# Patient Record
Sex: Female | Born: 1965 | Race: White | Hispanic: No | Marital: Married | Smoking: Never smoker
Health system: Southern US, Community
[De-identification: ages and names within clinical notes are randomized; demographics above are authoritative.]

## PROBLEM LIST (undated history)

## (undated) DIAGNOSIS — M2550 Pain in unspecified joint: Secondary | ICD-10-CM

## (undated) DIAGNOSIS — M25559 Pain in unspecified hip: Secondary | ICD-10-CM

## (undated) DIAGNOSIS — I5032 Chronic diastolic (congestive) heart failure: Secondary | ICD-10-CM

## (undated) DIAGNOSIS — R0600 Dyspnea, unspecified: Secondary | ICD-10-CM

## (undated) DIAGNOSIS — T7840XA Allergy, unspecified, initial encounter: Secondary | ICD-10-CM

## (undated) DIAGNOSIS — R0602 Shortness of breath: Secondary | ICD-10-CM

## (undated) DIAGNOSIS — E785 Hyperlipidemia, unspecified: Secondary | ICD-10-CM

## (undated) DIAGNOSIS — G4730 Sleep apnea, unspecified: Secondary | ICD-10-CM

## (undated) DIAGNOSIS — E538 Deficiency of other specified B group vitamins: Secondary | ICD-10-CM

## (undated) DIAGNOSIS — I872 Venous insufficiency (chronic) (peripheral): Secondary | ICD-10-CM

## (undated) DIAGNOSIS — R600 Localized edema: Secondary | ICD-10-CM

## (undated) DIAGNOSIS — E559 Vitamin D deficiency, unspecified: Secondary | ICD-10-CM

## (undated) DIAGNOSIS — K829 Disease of gallbladder, unspecified: Secondary | ICD-10-CM

## (undated) DIAGNOSIS — E669 Obesity, unspecified: Secondary | ICD-10-CM

## (undated) DIAGNOSIS — M549 Dorsalgia, unspecified: Secondary | ICD-10-CM

## (undated) DIAGNOSIS — K219 Gastro-esophageal reflux disease without esophagitis: Secondary | ICD-10-CM

## (undated) DIAGNOSIS — I10 Essential (primary) hypertension: Secondary | ICD-10-CM

## (undated) DIAGNOSIS — I509 Heart failure, unspecified: Secondary | ICD-10-CM

## (undated) DIAGNOSIS — E119 Type 2 diabetes mellitus without complications: Secondary | ICD-10-CM

## (undated) DIAGNOSIS — M25569 Pain in unspecified knee: Secondary | ICD-10-CM

## (undated) HISTORY — DX: Venous insufficiency (chronic) (peripheral): I87.2

## (undated) HISTORY — DX: Disease of gallbladder, unspecified: K82.9

## (undated) HISTORY — DX: Hyperlipidemia, unspecified: E78.5

## (undated) HISTORY — PX: OTHER SURGICAL HISTORY: SHX169

## (undated) HISTORY — DX: Deficiency of other specified B group vitamins: E53.8

## (undated) HISTORY — DX: Essential (primary) hypertension: I10

## (undated) HISTORY — DX: Shortness of breath: R06.02

## (undated) HISTORY — DX: Localized edema: R60.0

## (undated) HISTORY — DX: Sleep apnea, unspecified: G47.30

## (undated) HISTORY — DX: Chronic diastolic (congestive) heart failure: I50.32

## (undated) HISTORY — DX: Dyspnea, unspecified: R06.00

## (undated) HISTORY — DX: Pain in unspecified joint: M25.50

## (undated) HISTORY — DX: Heart failure, unspecified: I50.9

## (undated) HISTORY — DX: Obesity, unspecified: E66.9

## (undated) HISTORY — DX: Vitamin D deficiency, unspecified: E55.9

## (undated) HISTORY — DX: Allergy, unspecified, initial encounter: T78.40XA

## (undated) HISTORY — DX: Pain in unspecified knee: M25.569

## (undated) HISTORY — DX: Pain in unspecified hip: M25.559

## (undated) HISTORY — DX: Dorsalgia, unspecified: M54.9

## (undated) HISTORY — DX: Gastro-esophageal reflux disease without esophagitis: K21.9

## (undated) HISTORY — DX: Type 2 diabetes mellitus without complications: E11.9

## (undated) HISTORY — PX: CHOLECYSTECTOMY: SHX55

---
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
## 1998-04-05 ENCOUNTER — Other Ambulatory Visit: Admission: RE | Admit: 1998-04-05 | Discharge: 1998-04-05 | Payer: Self-pay | Admitting: Gynecology

## 1998-05-10 ENCOUNTER — Other Ambulatory Visit: Admission: RE | Admit: 1998-05-10 | Discharge: 1998-05-10 | Payer: Self-pay | Admitting: Gynecology

## 1999-07-31 ENCOUNTER — Other Ambulatory Visit: Admission: RE | Admit: 1999-07-31 | Discharge: 1999-07-31 | Payer: Self-pay | Admitting: Gynecology

## 2000-12-30 ENCOUNTER — Other Ambulatory Visit: Admission: RE | Admit: 2000-12-30 | Discharge: 2000-12-30 | Payer: Self-pay | Admitting: Gynecology

## 2001-02-02 ENCOUNTER — Ambulatory Visit (HOSPITAL_COMMUNITY): Admission: RE | Admit: 2001-02-02 | Discharge: 2001-02-02 | Payer: Self-pay | Admitting: Gynecology

## 2001-02-02 ENCOUNTER — Encounter: Payer: Self-pay | Admitting: Gynecology

## 2001-05-05 ENCOUNTER — Other Ambulatory Visit: Admission: RE | Admit: 2001-05-05 | Discharge: 2001-05-05 | Payer: Self-pay | Admitting: Gynecology

## 2001-07-31 ENCOUNTER — Encounter: Admission: RE | Admit: 2001-07-31 | Discharge: 2001-10-29 | Payer: Self-pay | Admitting: Gynecology

## 2001-11-03 ENCOUNTER — Encounter (INDEPENDENT_AMBULATORY_CARE_PROVIDER_SITE_OTHER): Payer: Self-pay

## 2001-11-03 ENCOUNTER — Inpatient Hospital Stay (HOSPITAL_COMMUNITY): Admission: AD | Admit: 2001-11-03 | Discharge: 2001-11-05 | Payer: Self-pay | Admitting: Gynecology

## 2001-12-15 ENCOUNTER — Other Ambulatory Visit: Admission: RE | Admit: 2001-12-15 | Discharge: 2001-12-15 | Payer: Self-pay | Admitting: Gynecology

## 2003-02-18 ENCOUNTER — Other Ambulatory Visit: Admission: RE | Admit: 2003-02-18 | Discharge: 2003-02-18 | Payer: Self-pay | Admitting: Gynecology

## 2005-05-20 ENCOUNTER — Other Ambulatory Visit: Admission: RE | Admit: 2005-05-20 | Discharge: 2005-05-20 | Payer: Self-pay | Admitting: Gynecology

## 2005-12-31 ENCOUNTER — Ambulatory Visit (HOSPITAL_COMMUNITY): Admission: RE | Admit: 2005-12-31 | Discharge: 2005-12-31 | Payer: Self-pay | Admitting: Gynecology

## 2007-01-29 ENCOUNTER — Ambulatory Visit (HOSPITAL_COMMUNITY): Admission: RE | Admit: 2007-01-29 | Discharge: 2007-01-29 | Payer: Self-pay | Admitting: Gynecology

## 2007-05-14 ENCOUNTER — Other Ambulatory Visit: Admission: RE | Admit: 2007-05-14 | Discharge: 2007-05-14 | Payer: Self-pay | Admitting: Gynecology

## 2008-01-28 ENCOUNTER — Ambulatory Visit: Payer: Self-pay | Admitting: Gynecology

## 2008-03-03 ENCOUNTER — Ambulatory Visit: Payer: Self-pay | Admitting: Gynecology

## 2008-04-07 ENCOUNTER — Encounter: Admission: RE | Admit: 2008-04-07 | Discharge: 2008-04-07 | Payer: Self-pay | Admitting: Gynecology

## 2010-06-29 NOTE — Discharge Summary (Signed)
   NAME:  [name], [name]                          ACCOUNT NO.:  [id number]   MEDICAL RECORD NO.:  [id number]                   PATIENT TYPE:  INP   LOCATION:  9146                                 FACILITY:  [facility]   PHYSICIAN:  Juan H. Lily Peer, M.D.             DATE OF BIRTH:  [birth year]   DATE OF ADMISSION:  11/03/2001  DATE OF DISCHARGE:  11/05/2001                                 DISCHARGE SUMMARY   DISCHARGE DIAGNOSES:  1. Intrauterine pregnancy 37 weeks delivered.  2. Gestational diabetes diet controlled.  3. Positive group B Strep.  4. Chronic hypertension.  5. Advanced maternal age.  6. Rh negative.  7. Status post spontaneous vaginal delivery.   HISTORY:  A 45 year old female gravida 2, para 1 with an EDC of November 23, 2001.  Prenatal course complicated by advanced maternal age.  Subsequently  underwent amniocentesis revealing normal chromosomes.  Also is Rh negative.  Given RhoGAM in pregnancy.  Had chronic hypertension.  _________ diet  controlled.  Also is positive group B Strep.   HOSPITAL COURSE:  On November 03, 2001 the patient was admitted at 37 weeks  with spontaneous rupture of membranes.  Cervix was 3 cm, 80%, -3.  Pitocin  augmentation on November 03, 2001.  The patient underwent spontaneous  vaginal delivery of a female Apgars 8/9 weighing 9 pounds and 1 ounce.  There were no complications.  Episiotomy second degree midline small  laceration which was repaired.  The patient was given IV penicillin per  protocol for positive group B Strep.  Postpartum patient remained afebrile,  voiding, stable condition.  It was noted that on November 04, 2001 there  was a small water blister ______________.  The patient says she is not sure  how and when that had appeared but it remained stable without any evidence  of infection.  Therefore, patient felt stable for discharge in satisfactory  condition on November 05, 2001.  Was given [facility] Gynecology  postoperative  instruction/postpartum booklet.   ACCESSORY CLINICAL FINDINGS:  Laboratories:  The patient is A-.  Rubella  immune.  On November 04, 2001 hemoglobin 11.2, hematocrit 32.8.   DISPOSITION:  The patient discharged home.  ____________ six weeks.  Any  problem prior to that time see in the office.  Prescription for Percocet  p.r.n. pain.  She was given RhoGAM prior to discharge.  Check her blood  sugars beginning one to two weeks prior to postpartum visit and bring log to  the office.     Susa Loffler, P.A.                    Juan H. Lily Peer, M.D.    TSG/MEDQ  D:  12/04/2001  T:  12/04/2001  Job:  [id number]

## 2010-08-17 ENCOUNTER — Other Ambulatory Visit: Payer: Self-pay | Admitting: Gynecology

## 2010-08-17 DIAGNOSIS — Z1231 Encounter for screening mammogram for malignant neoplasm of breast: Secondary | ICD-10-CM

## 2010-08-20 ENCOUNTER — Ambulatory Visit (HOSPITAL_COMMUNITY)
Admission: RE | Admit: 2010-08-20 | Discharge: 2010-08-20 | Disposition: A | Discharge status: Home / Self Care | Payer: 59 | Source: Ambulatory Visit | Attending: Gynecology | Admitting: Gynecology

## 2010-08-20 DIAGNOSIS — Z1231 Encounter for screening mammogram for malignant neoplasm of breast: Secondary | ICD-10-CM | POA: Insufficient documentation

## 2010-08-27 ENCOUNTER — Encounter: Payer: Self-pay | Admitting: Gynecology

## 2010-09-06 ENCOUNTER — Encounter: Payer: Self-pay | Admitting: Gynecology

## 2013-07-19 ENCOUNTER — Other Ambulatory Visit: Payer: Self-pay | Admitting: Gynecology

## 2013-07-19 DIAGNOSIS — Z1231 Encounter for screening mammogram for malignant neoplasm of breast: Secondary | ICD-10-CM

## 2013-07-22 ENCOUNTER — Ambulatory Visit (HOSPITAL_COMMUNITY)
Admission: RE | Admit: 2013-07-22 | Discharge: 2013-07-22 | Disposition: A | Discharge status: Home / Self Care | Payer: 59 | Source: Ambulatory Visit | Attending: Gynecology | Admitting: Gynecology

## 2013-07-22 DIAGNOSIS — Z1231 Encounter for screening mammogram for malignant neoplasm of breast: Secondary | ICD-10-CM

## 2013-08-31 ENCOUNTER — Ambulatory Visit: Payer: Self-pay | Admitting: Gynecology

## 2013-09-10 ENCOUNTER — Ambulatory Visit: Payer: Self-pay | Admitting: Gynecology

## 2013-10-22 ENCOUNTER — Ambulatory Visit: Payer: Self-pay | Admitting: Gynecology

## 2013-12-15 ENCOUNTER — Other Ambulatory Visit (HOSPITAL_COMMUNITY)
Admission: RE | Admit: 2013-12-15 | Discharge: 2013-12-15 | Disposition: A | Discharge status: Home / Self Care | Payer: PRIVATE HEALTH INSURANCE | Source: Ambulatory Visit | Attending: Gynecology | Admitting: Gynecology

## 2013-12-15 ENCOUNTER — Ambulatory Visit (INDEPENDENT_AMBULATORY_CARE_PROVIDER_SITE_OTHER): Payer: PRIVATE HEALTH INSURANCE | Admitting: Gynecology

## 2013-12-15 ENCOUNTER — Encounter: Payer: Self-pay | Admitting: Gynecology

## 2013-12-15 VITALS — BP 160/100 | Ht 66.0 in | Wt 384.0 lb

## 2013-12-15 DIAGNOSIS — I10 Essential (primary) hypertension: Secondary | ICD-10-CM

## 2013-12-15 DIAGNOSIS — Z1151 Encounter for screening for human papillomavirus (HPV): Secondary | ICD-10-CM | POA: Insufficient documentation

## 2013-12-15 DIAGNOSIS — T8389XA Other specified complication of genitourinary prosthetic devices, implants and grafts, initial encounter: Secondary | ICD-10-CM

## 2013-12-15 DIAGNOSIS — Z01419 Encounter for gynecological examination (general) (routine) without abnormal findings: Secondary | ICD-10-CM | POA: Diagnosis present

## 2013-12-15 DIAGNOSIS — Z30431 Encounter for routine checking of intrauterine contraceptive device: Secondary | ICD-10-CM

## 2013-12-15 NOTE — Progress Notes (Signed)
Wanda BernhardtSuzanne M Morris 02/26/1965 [id number]        48 y.o.  G2P2 for annual exam.  Has not been in for a number of years. Several issues noted below.  Past medical history,surgical history, problem list, medications, allergies, family history and social history were all reviewed and documented as reviewed in the EPIC chart.  ROS:  12 system ROS performed with pertinent positives and negatives included in the history, assessment and plan.   Additional significant findings :  none   Exam: [facility] personassistant Filed Vitals:   12/15/13 0830  BP: 160/100  Height: 5\' 6"  (1.676 m)  Weight: 384 lb (174.181 kg)   General appearance:  Normal affect, orientation and appearance. Skin: Grossly normal HEENT: Without gross lesions.  No cervical or supraclavicular adenopathy. Thyroid normal.  Lungs:  Clear without wheezing, rales or rhonchi Cardiac: RR, without RMG Abdominal:  Soft, nontender, without masses, guarding, rebound, organomegaly or hernia Breasts:  Examined lying and sitting without masses, retractions, discharge or axillary adenopathy. Pelvic:  Ext/BUS/vagina normal  Cervix normal. IUD visualized. Pap/HPV  Uterus unable to palpate. No gross masses or tenderness  Adnexa  Without gross masses or tenderness    Anus and perineum  Normal   Rectovaginal  Normal sphincter tone without palpated masses or tenderness.    Assessment/Plan:  48 y.o. G2P2 female for annual exam irregular menses, Mirena IUD.   1. Mirena IUD 01/2008. Patient understands she is one year past due to have it replaced. Increasing failure risk reviewed. Recommend patient return to have it replaced. Did discuss some studies indicate longer contraceptive benefits pass the 5 year. Regardless strongly recommend she return ASAP to have this replaced. 2. Morbid obesity. Reviewed situation with the patient and the great health risk to her. Recommend patient follow up with gastric bypass surgeons to discuss possible surgery. Diet and exercise  have not helped her. 3. Hypertension. Blood pressures 160/100. She's having no acute symptoms such as headaches or visual changes. Recommend ASAP follow up with urgent medical care near her house for evaluation and treatment as needed. Patient agrees to arrange ASAP. 4. Urinary incontinence. Mostly with stress symptoms such as coughing, sneezing, laughing. Reviewed that her morbid obesity is contributing to this and do not feel surgery appropriate until she loses weight. Check urinalysis today. 5. Pap smear 2009. Pap/HPV today. No history of significant abnormal Pap smears previously. 6. Mammography 07/2013. Continue with annual mammography. SBE monthly reviewed. 7. Health maintenance. No routine blood work done and she will have this done when she establishes care for ongoing medical evaluation in reference to her blood pressure and general health.     Dara LordsFONTAINE,TIMOTHY P MD, 9:03 AM 12/15/2013

## 2013-12-15 NOTE — Patient Instructions (Signed)
Follow up with urgent care in reference to your blood pressure. Currently 160/100. Very important to follow up now. Return to have your Mirena IUD replaced. Office will contact you about arranging appointment in reference to weight loss surgery.  You may obtain a copy of any labs that were done today by logging onto MyChart as outlined in the instructions provided with your AVS (after visit summary). The office will not call with normal lab results but certainly if there are any significant abnormalities then we will contact you.   Health Maintenance, Female A healthy lifestyle and preventative care can promote health and wellness.  Maintain regular health, dental, and eye exams.  Eat a healthy diet. Foods like vegetables, fruits, whole grains, low-fat dairy products, and lean protein foods contain the nutrients you need without too many calories. Decrease your intake of foods high in solid fats, added sugars, and salt. Get information about a proper diet from your caregiver, if necessary.  Regular physical exercise is one of the most important things you can do for your health. Most adults should get at least 150 minutes of moderate-intensity exercise (any activity that increases your heart rate and causes you to sweat) each week. In addition, most adults need muscle-strengthening exercises on 2 or more days a week.   Maintain a healthy weight. The body mass index (BMI) is a screening tool to identify possible weight problems. It provides an estimate of body fat based on height and weight. Your caregiver can help determine your BMI, and can help you achieve or maintain a healthy weight. For adults 20 years and older:  A BMI below 18.5 is considered underweight.  A BMI of 18.5 to 24.9 is normal.  A BMI of 25 to 29.9 is considered overweight.  A BMI of 30 and above is considered obese.  Maintain normal blood lipids and cholesterol by exercising and minimizing your intake of saturated fat.  Eat a balanced diet with plenty of fruits and vegetables. Blood tests for lipids and cholesterol should begin at age 62 and be repeated every 5 years. If your lipid or cholesterol levels are high, you are over 50, or you are a high risk for heart disease, you may need your cholesterol levels checked more frequently.Ongoing high lipid and cholesterol levels should be treated with medicines if diet and exercise are not effective.  If you smoke, find out from your caregiver how to quit. If you do not use tobacco, do not start.  Lung cancer screening is recommended for adults aged 86 80 years who are at high risk for developing lung cancer because of a history of smoking. Yearly low-dose computed tomography (CT) is recommended for people who have at least a 30-pack-year history of smoking and are a current smoker or have quit within the past 15 years. A pack year of smoking is smoking an average of 1 pack of cigarettes a day for 1 year (for example: 1 pack a day for 30 years or 2 packs a day for 15 years). Yearly screening should continue until the smoker has stopped smoking for at least 15 years. Yearly screening should also be stopped for people who develop a health problem that would prevent them from having lung cancer treatment.  If you are pregnant, do not drink alcohol. If you are breastfeeding, be very cautious about drinking alcohol. If you are not pregnant and choose to drink alcohol, do not exceed 1 drink per day. One drink is considered to be 12 ounces (  355 mL) of beer, 5 ounces (148 mL) of wine, or 1.5 ounces (44 mL) of liquor.  Avoid use of street drugs. Do not share needles with anyone. Ask for help if you need support or instructions about stopping the use of drugs.  High blood pressure causes heart disease and increases the risk of stroke. Blood pressure should be checked at least every 1 to 2 years. Ongoing high blood pressure should be treated with medicines, if weight loss and exercise are  not effective.  If you are 50 to 48 years old, ask your caregiver if you should take aspirin to prevent strokes.  Diabetes screening involves taking a blood sample to check your fasting blood sugar level. This should be done once every 3 years, after age 105, if you are within normal weight and without risk factors for diabetes. Testing should be considered at a younger age or be carried out more frequently if you are overweight and have at least 1 risk factor for diabetes.  Breast cancer screening is essential preventative care for women. You should practice "breast self-awareness." This means understanding the normal appearance and feel of your breasts and may include breast self-examination. Any changes detected, no matter how small, should be reported to a caregiver. Women in their 45s and 30s should have a clinical breast exam (CBE) by a caregiver as part of a regular health exam every 1 to 3 years. After age 27, women should have a CBE every year. Starting at age 27, women should consider having a mammogram (breast X-ray) every year. Women who have a family history of breast cancer should talk to their caregiver about genetic screening. Women at a high risk of breast cancer should talk to their caregiver about having an MRI and a mammogram every year.  Breast cancer gene (BRCA)-related cancer risk assessment is recommended for women who have family members with BRCA-related cancers. BRCA-related cancers include breast, ovarian, tubal, and peritoneal cancers. Having family members with these cancers may be associated with an increased risk for harmful changes (mutations) in the breast cancer genes BRCA1 and BRCA2. Results of the assessment will determine the need for genetic counseling and BRCA1 and BRCA2 testing.  The Pap test is a screening test for cervical cancer. Women should have a Pap test starting at age 77. Between ages 26 and 70, Pap tests should be repeated every 2 years. Beginning at age  21, you should have a Pap test every 3 years as long as the past 3 Pap tests have been normal. If you had a hysterectomy for a problem that was not cancer or a condition that could lead to cancer, then you no longer need Pap tests. If you are between ages 64 and 59, and you have had normal Pap tests going back 10 years, you no longer need Pap tests. If you have had past treatment for cervical cancer or a condition that could lead to cancer, you need Pap tests and screening for cancer for at least 20 years after your treatment. If Pap tests have been discontinued, risk factors (such as a new sexual partner) need to be reassessed to determine if screening should be resumed. Some women have medical problems that increase the chance of getting cervical cancer. In these cases, your caregiver may recommend more frequent screening and Pap tests.  The human papillomavirus (HPV) test is an additional test that may be used for cervical cancer screening. The HPV test looks for the virus that can cause the  cell changes on the cervix. The cells collected during the Pap test can be tested for HPV. The HPV test could be used to screen women aged 2 years and older, and should be used in women of any age who have unclear Pap test results. After the age of 60, women should have HPV testing at the same frequency as a Pap test.  Colorectal cancer can be detected and often prevented. Most routine colorectal cancer screening begins at the age of 101 and continues through age 49. However, your caregiver may recommend screening at an earlier age if you have risk factors for colon cancer. On a yearly basis, your caregiver may provide home test kits to check for hidden blood in the stool. Use of a small camera at the end of a tube, to directly examine the colon (sigmoidoscopy or colonoscopy), can detect the earliest forms of colorectal cancer. Talk to your caregiver about this at age 96, when routine screening begins. Direct examination  of the colon should be repeated every 5 to 10 years through age 28, unless early forms of pre-cancerous polyps or small growths are found.  Hepatitis C blood testing is recommended for all people born from [birth year] through 1965 and any individual with known risks for hepatitis C.  Practice safe sex. Use condoms and avoid high-risk sexual practices to reduce the spread of sexually transmitted infections (STIs). Sexually active women aged 33 and younger should be checked for Chlamydia, which is a common sexually transmitted infection. Older women with new or multiple partners should also be tested for Chlamydia. Testing for other STIs is recommended if you are sexually active and at increased risk.  Osteoporosis is a disease in which the bones lose minerals and strength with aging. This can result in serious bone fractures. The risk of osteoporosis can be identified using a bone density scan. Women ages 29 and over and women at risk for fractures or osteoporosis should discuss screening with their caregivers. Ask your caregiver whether you should be taking a calcium supplement or vitamin D to reduce the rate of osteoporosis.  Menopause can be associated with physical symptoms and risks. Hormone replacement therapy is available to decrease symptoms and risks. You should talk to your caregiver about whether hormone replacement therapy is right for you.  Use sunscreen. Apply sunscreen liberally and repeatedly throughout the day. You should seek shade when your shadow is shorter than you. Protect yourself by wearing long sleeves, pants, a wide-brimmed hat, and sunglasses year round, whenever you are outdoors.  Notify your caregiver of new moles or changes in moles, especially if there is a change in shape or color. Also notify your caregiver if a mole is larger than the size of a pencil eraser.  Stay current with your immunizations. Document Released: 08/13/2010 Document Revised: 05/25/2012 Document Reviewed:  08/13/2010 [facility] Patient Information 2014 [employer].

## 2013-12-15 NOTE — Addendum Note (Signed)
Addended by: Dayna BarkerGARDNER, Liliah Dorian K on: 12/15/2013 09:17 AM   Modules accepted: Orders

## 2013-12-16 LAB — URINALYSIS W MICROSCOPIC + REFLEX CULTURE
BILIRUBIN URINE: NEGATIVE
Casts: NONE SEEN
Crystals: NONE SEEN
Glucose, UA: NEGATIVE mg/dL
HGB URINE DIPSTICK: NEGATIVE
KETONES UR: NEGATIVE mg/dL
Leukocytes, UA: NEGATIVE
Nitrite: NEGATIVE
PROTEIN: NEGATIVE mg/dL
Specific Gravity, Urine: 1.015 (ref 1.005–1.030)
UROBILINOGEN UA: 0.2 mg/dL (ref 0.0–1.0)
pH: 7.5 (ref 5.0–8.0)

## 2013-12-16 LAB — CYTOLOGY - PAP

## 2013-12-17 LAB — URINE CULTURE

## 2013-12-20 ENCOUNTER — Telehealth: Payer: Self-pay | Admitting: *Deleted

## 2013-12-20 NOTE — Telephone Encounter (Signed)
[facility]ed [facility] and was informed that pt will need to have a information session. Pt will need to call at [phone] to get scheduled for this class and then [facility] will contact to schedule. Pt informed with all this.

## 2013-12-20 NOTE — Telephone Encounter (Signed)
-----   Message from Dara Lordsimothy P [name], MD sent at 12/15/2013  9:08 AM EST ----- [facility]

## 2013-12-22 ENCOUNTER — Telehealth: Payer: Self-pay | Admitting: Gynecology

## 2013-12-22 NOTE — Telephone Encounter (Signed)
12/22/13-I LM VM for pt that her insurance covers the removal and insertion of new Mirena at 100%, no copay. She already has appt with [facility]

## 2013-12-23 ENCOUNTER — Other Ambulatory Visit: Payer: Self-pay | Admitting: Gynecology

## 2013-12-23 DIAGNOSIS — Z30431 Encounter for routine checking of intrauterine contraceptive device: Secondary | ICD-10-CM

## 2013-12-23 MED ORDER — LEVONORGESTREL 20 MCG/24HR IU IUD: INTRAUTERINE_SYSTEM | Freq: Once | INTRAUTERINE | Status: DC

## 2013-12-31 ENCOUNTER — Encounter: Payer: Self-pay | Admitting: Gynecology

## 2013-12-31 ENCOUNTER — Ambulatory Visit (INDEPENDENT_AMBULATORY_CARE_PROVIDER_SITE_OTHER): Payer: PRIVATE HEALTH INSURANCE | Admitting: Gynecology

## 2013-12-31 DIAGNOSIS — Z30430 Encounter for insertion of intrauterine contraceptive device: Secondary | ICD-10-CM

## 2013-12-31 DIAGNOSIS — Z30433 Encounter for removal and reinsertion of intrauterine contraceptive device: Secondary | ICD-10-CM

## 2013-12-31 HISTORY — PX: INTRAUTERINE DEVICE INSERTION: SHX323

## 2013-12-31 NOTE — Patient Instructions (Signed)
Intrauterine Device Insertion Most often, an intrauterine device (IUD) is inserted into the uterus to prevent pregnancy. There are 2 types of IUDs available:  Copper IUD--This type of IUD creates an environment that is not favorable to sperm survival. The mechanism of action of the copper IUD is not known for certain. It can stay in place for 10 years.  Hormone IUD--This type of IUD contains the hormone progestin (synthetic progesterone). The progestin thickens the cervical mucus and prevents sperm from entering the uterus, and it also thins the uterine lining. There is no evidence that the hormone IUD prevents implantation. One hormone IUD can stay in place for up to 5 years, and a different hormone IUD can stay in place for up to 3 years. An IUD is the most cost-effective birth control if left in place for the full duration. It may be removed at any time. LET YOUR HEALTH CARE PROVIDER KNOW ABOUT:  Any allergies you have.  All medicines you are taking, including vitamins, herbs, eye drops, creams, and over-the-counter medicines.  Previous problems you or members of your family have had with the use of anesthetics.  Any blood disorders you have.  Previous surgeries you have had.  Possibility of pregnancy.  Medical conditions you have. RISKS AND COMPLICATIONS  Generally, intrauterine device insertion is a safe procedure. However, as with any procedure, complications can occur. Possible complications include:  Accidental puncture (perforation) of the uterus.  Accidental placement of the IUD either in the muscle layer of the uterus (myometrium) or outside the uterus. If this happens, the IUD can be found essentially floating around the bowels and must be taken out surgically.  The IUD may fall out of the uterus (expulsion). This is more common in women who have recently had a child.   Pregnancy in the fallopian tube (ectopic).  Pelvic inflammatory disease (PID), which is infection of  the uterus and fallopian tubes. The risk of PID is slightly increased in the first 20 days after the IUD is placed, but the overall risk is still very low. BEFORE THE PROCEDURE  Schedule the IUD insertion for when you will have your menstrual period or right after, to make sure you are not pregnant. Placement of the IUD is better tolerated shortly after a menstrual cycle.  You may need to take tests or be examined to make sure you are not pregnant.  You may be required to take a pregnancy test.  You may be required to get checked for sexually transmitted infections (STIs) prior to placement. Placing an IUD in someone who has an infection can make the infection worse.  You may be given a pain reliever to take 1 or 2 hours before the procedure.  An exam will be performed to determine the size and position of your uterus.  Ask your health care provider about changing or stopping your regular medicines. PROCEDURE   A tool (speculum) is placed in the vagina. This allows your health care provider to see the lower part of the uterus (cervix).  The cervix is prepped with a medicine that lowers the risk of infection.  You may be given a medicine to numb each side of the cervix (intracervical or paracervical block). This is used to block and control any discomfort with insertion.  A tool (uterine sound) is inserted into the uterus to determine the length of the uterine cavity and the direction the uterus may be tilted.  A slim instrument (IUD inserter) is inserted through the cervical   canal and into your uterus.  The IUD is placed in the uterine cavity and the insertion device is removed.  The nylon string that is attached to the IUD and used for eventual IUD removal is trimmed. It is trimmed so that it lays high in the vagina, just outside the cervix. AFTER THE PROCEDURE  You may have bleeding after the procedure. This is normal. It varies from light spotting for a few days to menstrual-like  bleeding.  You may have mild cramping. Document Released: 09/26/2010 Document Revised: 11/18/2012 Document Reviewed: 07/19/2012 [employer] Patient Information 2015 [employer]. This information is not intended to replace advice given to you by your health care provider. Make sure you discuss any questions you have with your health care provider.  

## 2013-12-31 NOTE — Progress Notes (Signed)
Patient presents for Mirena IUD removal and replacement. She has read through the booklet, has no contraindications and signed the consent form.  I reviewed the removal and insertional process with her as well as the risks to include infection, either immediate or long-term, uterine perforation or migration requiring surgery to remove, other complications such as pain, hormonal side effects, infertility and possibility of failure with subsequent pregnancy.   Exam with [facility] assistant Pelvic: External BUS vagina normal. Cervix normal normal with IUD strings visualized. Uterus grossly normal size, midline mobile nontender. Adnexa without masses or tenderness.  Procedure: The cervix was visualized with a speculum and the IUD strings were grasped with a [place] forcep and the old Mirena IUD was removed, sent to the patient and discarded. The cervix was then cleansed with Betadine, anterior lip grasped with a single-tooth tenaculum, the uterus was sounded and a Mirena IUD was placed according to manufacturer's recommendations without difficulty. The strings were trimmed. The patient tolerated well and will follow up in one month for a postinsertional check.  Lot number:  [id number]    Dara LordsFONTAINE,Ildefonso Keaney P MD, 12:36 PM 12/31/2013

## 2014-01-03 ENCOUNTER — Encounter: Payer: Self-pay | Admitting: Gynecology

## 2014-02-08 ENCOUNTER — Encounter: Payer: Self-pay | Admitting: Gynecology

## 2014-02-08 ENCOUNTER — Ambulatory Visit (INDEPENDENT_AMBULATORY_CARE_PROVIDER_SITE_OTHER): Payer: PRIVATE HEALTH INSURANCE | Admitting: Gynecology

## 2014-02-08 DIAGNOSIS — Z30431 Encounter for routine checking of intrauterine contraceptive device: Secondary | ICD-10-CM

## 2014-02-08 NOTE — Patient Instructions (Signed)
Follow up in November 2016 when you're due for your annual exam, sooner if any issues.

## 2014-02-08 NOTE — Progress Notes (Signed)
Wanda BernhardtSuzanne M Morris 12/07/1965 [id number]        48 y.o.  G2P2 Presents for IUD follow up exam. Head Mirena placed 12/2013. Had light staining starting several days ago. No discomfort or other issues.  Past medical history,surgical history, problem list, medications, allergies, family history and social history were all reviewed and documented in the EPIC chart.  Directed ROS with pertinent positives and negatives documented in the history of present illness/assessment and plan.  Exam: Biomedical scientistBlanca assistant General appearance:  Normal Abdomen obese without gross masses tenderness rebound guarding Pelvic external BUS vagina normal. Cervix normal with IUD string visualized and appropriate length. Bimanual without gross masses or tenderness.  Assessment/Plan:  48 y.o. G2P2 with normal follow up IUD exam. Patient will monitor and as long as she continues well then follow up November 2016 when she is due for her annual exam.     Wanda LordsFONTAINE,[name] P MD, 10:03 AM 02/08/2014

## 2014-07-28 ENCOUNTER — Encounter: Payer: Self-pay | Admitting: Family Medicine

## 2014-07-28 ENCOUNTER — Ambulatory Visit (INDEPENDENT_AMBULATORY_CARE_PROVIDER_SITE_OTHER): Payer: Self-pay | Admitting: Family Medicine

## 2014-07-28 DIAGNOSIS — Z23 Encounter for immunization: Secondary | ICD-10-CM | POA: Diagnosis not present

## 2014-07-28 DIAGNOSIS — K219 Gastro-esophageal reflux disease without esophagitis: Secondary | ICD-10-CM

## 2014-07-28 DIAGNOSIS — J302 Other seasonal allergic rhinitis: Secondary | ICD-10-CM | POA: Diagnosis not present

## 2014-07-28 DIAGNOSIS — I10 Essential (primary) hypertension: Secondary | ICD-10-CM

## 2014-07-28 MED ORDER — HYDROCHLOROTHIAZIDE 25 MG PO TABS: 25.0000 mg | ORAL_TABLET | Freq: Every day | ORAL | Status: DC

## 2014-07-28 NOTE — Addendum Note (Signed)
Addended by: Johnella Moloney on: 07/28/2014 03:21 PM   Modules accepted: Orders

## 2014-07-28 NOTE — Patient Instructions (Addendum)
BEFORE YOU LEAVE: -Tdap -Physical Exam in 1 month - ok per Dr. Selena Batten; but please leave 5 acute/sameday appointment open -Please come fasting but drink plenty of water and take your blood pressure medicine before coming  Start the hydrochlorothiazide 25 mg once daily  Treatment Options for Obesity:  A comprehensive exercise and diet program are advised and are the initial and longterm/lifelong treatment for obesity.  This is a long and initially often difficult process and it will often take years to get to reach optimal health  As you get healthier you will build extra blood volume and lean tissue that is heavier then fatty tissue and you will hit plateaus in your weight - thus I ADVISE YOU DO NOT FOLLOW YOUR WEIGHT ON A SCALE   The safest and healthiest way to improve you physical and mental health is to: - eat a healthy diet consisting of regular small meals - lots of vegetables, fruits, beans, nuts, seeds, healthy meats such as white chicken and fish - avoid fried foods, starches, sweets, fast food, processed foods, sodas, red meet and other fattening foods.  - work up to getting at least 150-300 minutes of aerobic exercise per week.  -reduce stress - counseling, meditation, relaxation to balance other aspects of your life   Medication for Obesity: IF BMI >30 and you have not achieved weight loss through diet and exercise alone, we suggest pharmacologic therapy can be added to diet and exercise.  First Choice:  1)Orlistat (Xenical): -likely the safest in terms of heart and vascular side effects and helps cholesterol - usually recommended for 2 - 4 years -common and or serious side effects include but are not limited to: anaphylaxis, liver toxicity, gas, oily stools, loose stools, fatty stools -150 mg up to 3 times daily only during meals containing fat, diet should be <30% fat -not recommended in: pregnancy, malabsorption, some kidney stones, anorexia or bulemia  history  2)Lorcaserin (Belviq) -no long term safety data -to be discontinued if do not achieve 5% body weight reduction in 12 weeks -common and or serious side effects include but are not limited to: heart disease, anemia, abuse and dependency, depression, suicide, nausea and vomiting, headaches, low blood sugar, dizziness, anemia, fatigue, diarrhea, urinary tract infections, back pain, dry mouth, pain, cognitive impairment 10mg  twice daily -not recommended if pregnant, kidney disease, heart disease, diabetes, depression -I do not advise long term and advise weaning of if not effective and after 1-2 years  3)combination phentermine-topiramate (Qsymia)- for men or post menopausal women or monthly pregnancy test: -can cause fetal anomalies -to be discontinued GRADUALLY if do not achieve 5% body weight reduction in 12 weeks -adverse: -not recommended if: heart disease, breastfeeding, pregnant, hyperthyroid, glaucoma, drug abuse history, renal or liver problems, alcohol use, depression -common and or serious side effects include but are not limited to: metabolic acidosis, kidney stones, bone issues, electrolyte issues, heart attack, suicide, psychosis, dependency and abuse, severe reaction, seizure is stop suddenly, dry mouth, numbness, headache, blurred vision, diarrhea, fatigue, depression, anxiety, pain, poor focus, acid reflux, dry eyes, eye pain, heart arhythmia -I do not advise long term and advise weaning of if not effective and in 1-2 years  If diabetes:  1) Metformin, orlistat or  2)Liraglutide (Victoza) -can cause t cell tumors, anaphylaxis, kidney toxicity, pancreatitis, nausea, vomiting, diarrhea, headache -injected:0.6mg  daily for 1 week, then 1.2mg  daily  Surgery is also an option and [place] has many useful resources if you are considering this.  Please check  out this website if you wish:  [email or web address]

## 2014-07-28 NOTE — Progress Notes (Signed)
HPI:  [name] is here to establish care. [name] has been seeing gyn and felt needed a family doctor. Last PCP and physical: sees Dr. Audie Box in gyn - did physical in November. Does her paps and mammos with them.  Has the following chronic problems that require follow up and concerns today:  Obesity: -she has struggled with this her whole life -no regular exercise; diet is poor -enjoys foods -has not had any labwork done in a long time  Elevated Blood Pressure: -reports has been up from time to time in the past -never on medications -does have a strong FH of HTN -denies: CP, SOB, new swelling - chronic ankle edema, palpitaitons  GERD: -chronic -intermittent heartburn tied to what she eats -ranitidine as needed -stable -denies: swallowing problems, frequent heartburn   ROS negative for unless reported above: fevers, unintentional weight loss, hearing or vision loss, chest pain, palpitations, struggling to breath, hemoptysis, melena, hematochezia, hematuria, falls, loc, si, thoughts of self harm  Past Medical History  Diagnosis Date  . GERD (gastroesophageal reflux disease)   . Allergy   . Hypertension     Past Surgical History  Procedure Laterality Date  . Cholecystectomy    . Birth mark removed    . Intrauterine device insertion  12/31/2013    Mirena    Family History  Problem Relation Age of Onset  . Hypertension Father   . Diabetes Father   . Cancer Paternal Grandfather     Stomach  . Other      History   Social History  . Marital Status: Married    Spouse Name: N/A  . Number of Children: N/A  . Years of Education: N/A   Social History Main Topics  . Smoking status: Never Smoker   . Smokeless tobacco: Not on file  . Alcohol Use: 0.0 oz/week    0 Standard drinks or equivalent per week     Comment: Rare  . Drug Use: No  . Sexual Activity: Yes    Birth Control/ Protection: IUD     Comment: Mirena 12/31/2013   Other Topics Concern  .  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  fluticasone (FLONASE) 50 MCG/ACT nasal spray, Place into both nostrils daily., Disp: , Rfl:  .  levonorgestrel (MIRENA) 20 MCG/24HR IUD, 1 each by Intrauterine route once., Disp: , Rfl:  .  ranitidine (ACID REDUCER) 150 MG tablet, Take 150 mg by mouth as needed for heartburn., Disp: , Rfl:  .  hydrochlorothiazide (HYDRODIURIL) 25 MG tablet, Take 1 tablet (25 mg total) by mouth daily., Disp: 90 tablet, Rfl: 3  Current facility-administered medications:  .  levonorgestrel (MIRENA) 20 MCG/24HR IUD, , Intrauterine, Once, Dara Lords, MD  EXAM:  Filed Vitals:   07/28/14 1425  BP: 142/98  Pulse: 83  Temp: 98.2 F (36.8 C)    Body mass index is 65.71 kg/(m^2).  GENERAL: vitals reviewed and listed above, alert, oriented, appears well hydrated and in no acute distress  HEENT: atraumatic, conjunttiva clear, no obvious abnormalities on inspection of external nose and ears  NECK:  no obvious masses on inspection  LUNGS: clear to auscultation bilaterally, no wheezes, rales or rhonchi, good air movement  CV: HRRR, no peripheral edema  MS: moves all extremities without noticeable abnormality  PSYCH: pleasant and cooperative, no obvious depression or anxiety  ASSESSMENT AND PLAN:  Discussed the following assessment and plan:  Morbid obesity  Gastroesophageal reflux disease, esophagitis presence not specified  Seasonal allergies  Essential hypertension - Plan: hydrochlorothiazide (HYDRODIURIL) 25 MG tablet -We reviewed the PMH, PSH, FH, SH, Meds and Allergies. -We provided refills for any medications we will prescribe as needed. -We addressed  current concerns per orders and patient instructions. -We have asked for records for pertinent exams, studies, vaccines and notes from previous providers. -We have advised patient to follow up per instructions below.   -Patient advised to return or notify a doctor immediately if symptoms worsen or persist or new concerns arise.  Patient Instructions  BEFORE YOU LEAVE: -Tdap -Physical Exam in 1 month - ok per Dr. Selena Batten; but please leave 5 acute/sameday appointment open -Please come fasting but drink plenty of water and take your blood pressure medicine before coming  Start the hydrochlorothiazide 25 mg once daily  Treatment Options for Obesity:  A comprehensive exercise and diet program are advised and are the initial and longterm/lifelong treatment for obesity.  This is a long and initially often difficult process and it will often take years to get to reach optimal health  As you get healthier you will build extra blood volume and lean tissue that is heavier then fatty tissue and you will hit plateaus in your weight - thus I ADVISE YOU DO NOT FOLLOW YOUR WEIGHT ON A SCALE   The safest and healthiest way to improve you physical and mental health is to: - eat a healthy diet consisting of regular small meals - lots of vegetables, fruits, beans, nuts, seeds, healthy meats such as white chicken and fish - avoid fried foods, starches, sweets, fast food, processed foods, sodas, red meet and other fattening foods.  - work up to getting at least 150-300 minutes of aerobic exercise per week.  -reduce stress - counseling, meditation, relaxation to balance other aspects of your life   Medication for Obesity: IF BMI >30 and you have not achieved weight loss through diet and exercise alone, we suggest pharmacologic therapy can be added to diet and exercise.  First Choice:  1)Orlistat (Xenical): -likely the safest in terms of heart and vascular side effects and helps cholesterol - usually  recommended for 2 - 4 years -common and or serious side effects include but are not limited to: anaphylaxis, liver toxicity, gas, oily stools, loose stools, fatty stools -150 mg up to 3 times daily only during meals containing fat, diet should be <30% fat -not recommended in: pregnancy, malabsorption, some kidney stones, anorexia or bulemia history  2)Lorcaserin (Belviq) -no long term safety data -to be discontinued if do not achieve 5% body weight reduction in 12 weeks -common and or serious side effects include but are not limited to: heart disease, anemia, abuse and dependency, depression, suicide, nausea and vomiting, headaches, low blood sugar, dizziness, anemia, fatigue, diarrhea, urinary tract infections, back pain, dry mouth, pain, cognitive impairment  twice daily -not recommended if pregnant, kidney disease, heart disease, diabetes, depression -I do not advise long term and advise weaning of if not effective and after 1-2 years  3)combination phentermine-topiramate (Qsymia)- for men or post menopausal women or monthly pregnancy test: -can cause fetal anomalies -to  be discontinued GRADUALLY if do not achieve 5% body weight reduction in 12 weeks -adverse: -not recommended if: heart disease, breastfeeding, pregnant, hyperthyroid, glaucoma, drug abuse history, renal or liver problems, alcohol use, depression -common and or serious side effects include but are not limited to: metabolic acidosis, kidney stones, bone issues, electrolyte issues, heart attack, suicide, psychosis, dependency and abuse, severe reaction, seizure is stop suddenly, dry mouth, numbness, headache, blurred vision, diarrhea, fatigue, depression, anxiety, pain, poor focus, acid reflux, dry eyes, eye pain, heart arhythmia -I do not advise long term and advise weaning of if not effective and in 1-2 years  If diabetes:  1) Metformin, orlistat or  2)Liraglutide (Victoza) -can cause t cell tumors, anaphylaxis, kidney  toxicity, pancreatitis, nausea, vomiting, diarrhea, headache -injected:0.6mg  daily for 1 week, then 1.2mg  daily  Surgery is also an option and [place] has many useful resources if you are considering this.  Please check out this website if you wish:  [email or web address]        Samie Barclift R.

## 2014-07-28 NOTE — Progress Notes (Signed)
Pre visit review using our clinic review tool, if applicable. No additional management support is needed unless otherwise documented below in the visit note. 

## 2014-09-01 ENCOUNTER — Encounter: Payer: 59 | Admitting: Family Medicine

## 2014-11-09 ENCOUNTER — Ambulatory Visit (INDEPENDENT_AMBULATORY_CARE_PROVIDER_SITE_OTHER): Payer: BLUE CROSS/BLUE SHIELD | Admitting: Family Medicine

## 2014-11-09 ENCOUNTER — Encounter: Payer: Self-pay | Admitting: Family Medicine

## 2014-11-09 VITALS — BP 128/88 | HR 90 | Temp 97.5°F | Ht 65.5 in | Wt 386.1 lb

## 2014-11-09 DIAGNOSIS — I10 Essential (primary) hypertension: Secondary | ICD-10-CM

## 2014-11-09 DIAGNOSIS — I872 Venous insufficiency (chronic) (peripheral): Secondary | ICD-10-CM

## 2014-11-09 DIAGNOSIS — Z23 Encounter for immunization: Secondary | ICD-10-CM

## 2014-11-09 DIAGNOSIS — Z0000 Encounter for general adult medical examination without abnormal findings: Secondary | ICD-10-CM

## 2014-11-09 DIAGNOSIS — R739 Hyperglycemia, unspecified: Secondary | ICD-10-CM

## 2014-11-09 DIAGNOSIS — K219 Gastro-esophageal reflux disease without esophagitis: Secondary | ICD-10-CM

## 2014-11-09 LAB — BASIC METABOLIC PANEL
BUN: 9 mg/dL (ref 6–23)
CALCIUM: 9.3 mg/dL (ref 8.4–10.5)
CO2: 33 mEq/L — ABNORMAL HIGH (ref 19–32)
Chloride: 98 mEq/L (ref 96–112)
Creatinine, Ser: 0.58 mg/dL (ref 0.40–1.20)
GFR: 117.33 mL/min (ref >60.00)
GLUCOSE: 118 mg/dL — AB (ref 70–99)
POTASSIUM: 4 meq/L (ref 3.5–5.1)
SODIUM: 138 meq/L (ref 135–145)

## 2014-11-09 LAB — LIPID PANEL
CHOLESTEROL: 171 mg/dL (ref 0–200)
HDL: 44.1 mg/dL (ref >39.00)
LDL CALC: 110 mg/dL — AB (ref 0–99)
NonHDL: 127.15
TRIGLYCERIDES: 88 mg/dL (ref 0.0–149.0)
Total CHOL/HDL Ratio: 4
VLDL: 17.6 mg/dL (ref 0.0–40.0)

## 2014-11-09 LAB — HEMOGLOBIN A1C: HEMOGLOBIN A1C: 7.6 % — AB (ref 4.6–6.5)

## 2014-11-09 NOTE — Progress Notes (Signed)
Pre visit review using our clinic review tool, if applicable. No additional management support is needed unless otherwise documented below in the visit note. 

## 2014-11-09 NOTE — Patient Instructions (Addendum)
BEFORE YOU LEAVE: -flu shot -labs -schedule follow up in 4 months  Compression socks, elevated legs in the evenings  We recommend the following healthy lifestyle measures: - eat a healthy whole foods diet consisting of regular small meals composed of vegetables, fruits, beans, nuts, seeds, healthy meats such as white chicken and fish and whole grains.  - avoid sweets, white starchy foods, fried foods, fast food, processed foods, sodas, red meet and other fattening foods.  - get a least 150-300 minutes of aerobic exercise per week.   -We have ordered labs or studies at this visit. It can take up to 1-2 weeks for results and processing. We will contact you with instructions IF your results are abnormal. Normal results will be released to your [facility]. If you have not heard from [place] or can not find your results in [facility] in 2 weeks please contact our office.

## 2014-11-09 NOTE — Progress Notes (Addendum)
HPI:  Here for CPE:  -Concerns and/or follow up today:   Obesity: -she has struggled with this her whole life -no regular exercise; diet is poor - advised lifestyle recs last visit but she had a death in the family and has had difficulty initiating this, now doing better -enjoys foods -chronic LE edema -has not had any labwork done in a long time  Elevated Blood Pressure: -started hctz 07/2014 and advised 1 mo f/u -does have a strong FH of HTN -denies: CP, SOB, new swelling - chronic ankle edema, palpitaitons  GERD: -chronic -intermittent heartburn tied to what she eats -ranitidine as needed -stable -denies: swallowing problems, frequent heartburn  -Diet: variety of foods, balance and well rounded, larger portion sizes  -Exercise: no regular exercise  -Taking folic acid, vitamin D or calcium: no  -Diabetes and Dyslipidemia Screening:  -Hx of HTN: no  -pap history: normal with neg hpv 12/2013 with Dr. Audie Box  -sexual activity: yes, female partner, no new partners  -wants STI testing: no  -FH breast, colon or ovarian ca: see FH Last mammogram: breast health done with her gyn (Dr. Audie Box), mammo 07/2013   -Alcohol, Tobacco, drug use: see social history  Review of Systems - no fevers, unintentional weight loss, vision loss, hearing loss, chest pain, sob, hemoptysis, melena, hematochezia, hematuria, genital discharge, changing or concerning skin lesions, bleeding, bruising, loc, thoughts of self harm or SI  Past Medical History  Diagnosis Date  . GERD (gastroesophageal reflux disease)   . Allergy   . Hypertension     Past Surgical History  Procedure Laterality Date  . Cholecystectomy    . Birth mark removed    . Intrauterine device insertion  12/31/2013    Mirena    Family History  Problem Relation Age of Onset  . Hypertension Father   . Diabetes Father   . Cancer Paternal Grandfather     Stomach  . Other      Social History   Social History  .  Marital Status: Married    Spouse Name: N/A  . Number of Children: N/A  . Years of Education: N/A   Social History Main Topics  . Smoking status: Never Smoker   . Smokeless tobacco: None  . Alcohol Use: 0.0 oz/week    0 Standard drinks or equivalent per week     Comment: Rare  . Drug Use: No  . Sexual Activity: Yes    Birth Control/ Protection: IUD     Comment: Mirena 12/31/2013   Other Topics Concern  . 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  fluticasone (FLONASE) 50 MCG/ACT nasal spray, Place into both nostrils daily., Disp: , Rfl:  .  hydrochlorothiazide (HYDRODIURIL) 25 MG tablet, Take 1 tablet (25 mg total) by mouth daily., Disp: 90 tablet, Rfl: 3 .  levonorgestrel (MIRENA) 20 MCG/24HR IUD, 1 each by Intrauterine route once., Disp: , Rfl:  .  ranitidine (ACID REDUCER) 150 MG tablet, Take 150 mg by mouth as needed for heartburn., Disp: , Rfl:   Current facility-administered medications:  .  levonorgestrel (MIRENA) 20 MCG/24HR IUD, ,  Intrauterine, Once, Dara Lords, MD  EXAMCeasar Mons Vitals:   11/09/14 1115  BP: 128/88  Pulse: 90  Temp: 97.5 F (36.4 C)    GENERAL: vitals reviewed and listed below, alert, oriented, appears well hydrated and in no acute distress  HEENT: head atraumatic, PERRLA, normal appearance of eyes, ears, nose and mouth. moist mucus membranes.  NECK: supple, no masses or lymphadenopathy  LUNGS: clear to auscultation bilaterally, no rales, rhonchi or wheeze  CV: HRRR, no peripheral edema or cyanosis, normal pedal pulses  BREAST: deferred - does with gyn  ABDOMEN: bowel sounds normal, soft, non tender to palpation, no masses, no rebound or  guarding  GU: deferred - does with gyn  SKIN: mild LE venous stasis dermatitis  MS: normal gait, moves all extremities normally  NEURO: CN II-XII grossly intact, normal muscle strength and sensation to light touch on extremities  PSYCH: normal affect, pleasant and cooperative  ASSESSMENT AND PLAN:  Discussed the following assessment and plan:  Encounter for preventive health examination  Essential hypertension - Plan: Basic metabolic panel  Morbid obesity - Plan: Lipid Panel, Hemoglobin A1c  Gastroesophageal reflux disease, esophagitis presence not specified  Chronic venous insufficiency   -Discussed and advised all [place] preventive services health task force level A and B recommendations for age, sex and risks.  -Advised at least 150 minutes of exercise per week and a healthy diet low in saturated fats and sweets and consisting of fresh fruits and vegetables, lean meats such as fish and white chicken and whole grains.  -FASTING labs, studies and vaccines per orders this encounter  Orders Placed This Encounter  Procedures  . Lipid Panel  . Hemoglobin A1c  . Basic metabolic panel    Patient advised to return to clinic immediately if symptoms worsen or persist or new concerns.  Patient Instructions  BEFORE YOU LEAVE: -flu shot -labs -schedule follow up in 4 months  Compression socks, elevated legs in the evenings  We recommend the following healthy lifestyle measures: - eat a healthy whole foods diet consisting of regular small meals composed of vegetables, fruits, beans, nuts, seeds, healthy meats such as white chicken and fish and whole grains.  - avoid sweets, white starchy foods, fried foods, fast food, processed foods, sodas, red meet and other fattening foods.  - get a least 150-300 minutes of aerobic exercise per week.   -We have ordered labs or studies at this visit. It can take up to 1-2 weeks for results and processing. We will contact you with instructions  IF your results are abnormal. Normal results will be released to your [facility]. If you have not heard from [place] or can not find your results in [facility] in 2 weeks please contact our office.           No Follow-up on file.  [name] R.

## 2014-11-10 NOTE — Addendum Note (Signed)
Addended by: Johnella Moloney on: 11/10/2014 04:39 PM   Modules accepted: Orders

## 2014-11-21 ENCOUNTER — Ambulatory Visit (INDEPENDENT_AMBULATORY_CARE_PROVIDER_SITE_OTHER): Payer: BLUE CROSS/BLUE SHIELD | Admitting: Family Medicine

## 2014-11-21 DIAGNOSIS — R69 Illness, unspecified: Secondary | ICD-10-CM

## 2014-11-21 NOTE — Progress Notes (Signed)
Late cancel/No show

## 2014-11-30 ENCOUNTER — Ambulatory Visit: Payer: BLUE CROSS/BLUE SHIELD | Admitting: Family Medicine

## 2014-12-02 ENCOUNTER — Ambulatory Visit (INDEPENDENT_AMBULATORY_CARE_PROVIDER_SITE_OTHER): Payer: BLUE CROSS/BLUE SHIELD | Admitting: Family Medicine

## 2014-12-02 ENCOUNTER — Encounter: Payer: Self-pay | Admitting: Family Medicine

## 2014-12-02 ENCOUNTER — Other Ambulatory Visit: Payer: Self-pay

## 2014-12-02 VITALS — BP 124/70 | HR 86 | Temp 98.6°F | Wt 386.0 lb

## 2014-12-02 DIAGNOSIS — R739 Hyperglycemia, unspecified: Secondary | ICD-10-CM | POA: Diagnosis not present

## 2014-12-02 DIAGNOSIS — E785 Hyperlipidemia, unspecified: Secondary | ICD-10-CM

## 2014-12-02 DIAGNOSIS — I10 Essential (primary) hypertension: Secondary | ICD-10-CM

## 2014-12-02 DIAGNOSIS — J302 Other seasonal allergic rhinitis: Secondary | ICD-10-CM

## 2014-12-02 DIAGNOSIS — J329 Chronic sinusitis, unspecified: Secondary | ICD-10-CM

## 2014-12-02 DIAGNOSIS — J310 Chronic rhinitis: Secondary | ICD-10-CM

## 2014-12-02 LAB — HEMOGLOBIN A1C: HEMOGLOBIN A1C: 7.5 % — AB (ref 4.6–6.5)

## 2014-12-02 MED ORDER — ROSUVASTATIN CALCIUM 5 MG PO TABS: 5.0000 mg | ORAL_TABLET | Freq: Every day | ORAL | Status: DC

## 2014-12-02 NOTE — Progress Notes (Signed)
HPI:  [name] is a 49 yo F here for a follow up visit to discus a new diagnosis of diabetes mellitis type 2 along with hyperlipidemia.  Diabetes: -new dx 10/2014 -complications: none -diet and exercise: see below -denies: polyuria, polydipsia, vision changes  Hyperlipidemia/Obesity: -poor diet and no regular exercise -no hx of statin intol  Hypertension: -meds: hctz -denies: CP, SOB, DOE, HA  Sinus congestion: -chronic issues with allergies - uses flonase sometimes -for 3 days, increased congestion, PND, sinus pressure R -denies: fevers, SOB, NVD  ROS: See pertinent positives and negatives per HPI.  Past Medical History  Diagnosis Date  . GERD (gastroesophageal reflux disease)   . Allergy   . Hypertension     Past Surgical History  Procedure Laterality Date  . Cholecystectomy    . Birth mark removed    . Intrauterine device insertion  12/31/2013    Mirena    Family History  Problem Relation Age of Onset  . Hypertension Father   . Diabetes Father   . Cancer Paternal Grandfather     Stomach  . Other      Social History   Social History  . Marital Status: Married    Spouse Name: N/A  . Number of Children: N/A  . Years of Education: N/A   Social History Main Topics  . Smoking status: Never Smoker   . Smokeless tobacco: None  . Alcohol Use: 0.0 oz/week    0 Standard drinks or equivalent per week     Comment: Rare  . Drug Use: No  . Sexual Activity: Yes    Birth Control/ Protection: IUD     Comment: Mirena 12/31/2013   Other Topics Concern  . 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  fluticasone (FLONASE) 50 MCG/ACT nasal spray, Place into both  nostrils daily., Disp: , Rfl:  .  hydrochlorothiazide (HYDRODIURIL) 25 MG tablet, Take 1 tablet (25 mg total) by mouth daily., Disp: 90 tablet, Rfl: 3 .  levonorgestrel (MIRENA) 20 MCG/24HR IUD, 1 each by Intrauterine route once., Disp: , Rfl:  .  ranitidine (ACID REDUCER) 150 MG tablet, Take 150 mg by mouth as needed for heartburn., Disp: , Rfl:   Current facility-administered medications:  .  levonorgestrel (MIRENA) 20 MCG/24HR IUD, , Intrauterine, Once, Timothy P [name], MD  EXAM:  Filed Vitals:   12/02/14 1023  BP: 124/70  Pulse: 86  Temp: 98.6 F (37 C)    Body mass index is 63.23 kg/(m^2).  GENERAL: vitals reviewed and listed above, alert, oriented, appears well hydrated and in no acute distress  HEENT: atraumatic, conjunttiva clear, no obvious abnormalities on inspection of external nose and ears  NECK: no obvious masses on inspection  LUNGS: clear to auscultation bilaterally, no wheezes, rales or rhonchi, good air movement  CV: HRRR, no peripheral edema  MS: moves all extremities without noticeable abnormality  PSYCH: pleasant and cooperative, no obvious depression or anxiety  ASSESSMENT AND PLAN:  Discussed the following assessment and plan:  Hyperglycemia - Plan: Hemoglobin A1c  Essential hypertension  Morbid obesity, unspecified obesity type (HCC)  Hyperlipemia  Seasonal allergies  Rhinosinusitis  -lifestyle recs, implications and treatment, treatment side effects and complications of diabetes discussed at length -recheck hgba1c to confirm dx of diabetes then plan to start low dose metformin, low dose statin and lifestyle changes -follow up in 3 months -see below for recs for rhinosinustis: add antihistaimine, 3 days nasal decongestant, return precautions -Patient advised to return or notify a doctor immediately if symptoms worsen or persist or new concerns arise.  Patient Instructions  BEFORE YOU LEAVE: -labs -schedule follow up in 3-4  months  We recommend the following healthy lifestyle measures: - eat a healthy whole foods diet consisting of regular small meals composed of vegetables, fruits, beans, nuts, seeds, healthy meats such as white chicken and fish and whole grains.  - avoid sweets, white starchy foods, fried foods, fast food, processed foods, sodas, red meet and other fattening foods.  - get a least 150-300 minutes of aerobic exercise per week.   If diabetes confirmed on lab we will plan to start metformin for diabetes and crestor for the cholesterol and you will need to schedule a diabetic eye exam every year.  INSTRUCTIONS FOR UPPER RESPIRATORY INFECTION:  -plenty of rest and fluids  -nasal saline wash 2-3 times daily (use prepackaged nasal saline or bottled/distilled water if making your own)   -can use AFRIN nasal spray for drainage and nasal congestion - but do NOT use longer then 3-4 days  -can use tylenol (in no history of liver disease) or ibuprofen (if no history of kidney disease, bowel bleeding or significant heart disease) as directed for aches and sorethroat  -in the winter time, using a humidifier at night is helpful (please follow cleaning instructions)  -if you are taking a cough medication - use only as directed, may also try a teaspoon of honey to coat the throat and throat lozenges. If given a cough medication with codeine or hydrocodone or other narcotic please be advised that this contains a strong and  potentially addicting medication. Please follow instructions carefully, take as little as possible and only use AS NEEDED for severe cough. Discuss potential side effects with your pharmacy. Please do not drive or operate machinery while taking these types of medications. Please do not take other sedating medications, drugs or alcohol while taking this medication without discussing with your doctor.  -for sore throat, salt water gargles can help  -follow up if you have fevers, facial pain,  tooth pain, difficulty breathing or are worsening or symptoms persist longer then expected  Upper Respiratory Infection, Adult An upper respiratory infection (URI) is also known as the common cold. It is often caused by a type of germ (virus). Colds are easily spread (contagious). You can pass it to others by kissing, coughing, sneezing, or drinking out of the same glass. Usually, you get better in 1 to 3  weeks.  However, the cough can last for even longer. HOME CARE   Only take medicine as told by your doctor. Follow instructions provided above.  Drink enough water and fluids to keep your pee (urine) clear or pale yellow.  Get plenty of rest.  Return to work when your temperature is < 100 for 24 hours or as told by your doctor. You may use a face mask and wash your hands to stop your cold from spreading. GET HELP RIGHT AWAY IF:   After the first few days, you feel you are getting worse.  You have questions about your medicine.  You have  chills, shortness of breath, or red spit (mucus).  You have pain in the face for more then 1-2 days, especially when you bend forward.  You have a fever, puffy (swollen) neck, pain when you swallow, or white spots in the back of your throat.  You have a bad headache, ear pain, sinus pain, or chest pain.  You have a high-pitched whistling sound when you breathe in and out (wheezing).  You cough up blood.  You have sore muscles or a stiff neck. MAKE SURE YOU:   Understand these instructions.  Will watch your condition.  Will get help right away if you are not doing well or get worse. Document Released: 07/17/2007 Document Revised: 04/22/2011 Document Reviewed: 05/05/2013 [facility] Patient Information 2015 [employer], [place]. This information is not intended to replace advice given to you by your health care provider. Make sure you discuss any questions you have with your health care provider.      Kriste BasqueKIM, Haislee Corso R.

## 2014-12-02 NOTE — Patient Instructions (Addendum)
BEFORE YOU LEAVE: -labs -schedule follow up in 3-4 months  We recommend the following healthy lifestyle measures: - eat a healthy whole foods diet consisting of regular small meals composed of vegetables, fruits, beans, nuts, seeds, healthy meats such as white chicken and fish and whole grains.  - avoid sweets, white starchy foods, fried foods, fast food, processed foods, sodas, red meet and other fattening foods.  - get a least 150-300 minutes of aerobic exercise per week.   If diabetes confirmed on lab we will plan to start metformin for diabetes and crestor for the cholesterol and you will need to schedule a diabetic eye exam every year.  INSTRUCTIONS FOR UPPER RESPIRATORY INFECTION:  -plenty of rest and fluids  -nasal saline wash 2-3 times daily (use prepackaged nasal saline or bottled/distilled water if making your own)   -can use AFRIN nasal spray for drainage and nasal congestion - but do NOT use longer then 3-4 days  -can use tylenol (in no history of liver disease) or ibuprofen (if no history of kidney disease, bowel bleeding or significant heart disease) as directed for aches and sorethroat  -in the winter time, using a humidifier at night is helpful (please follow cleaning instructions)  -if you are taking a cough medication - use only as directed, may also try a teaspoon of honey to coat the throat and throat lozenges. If given a cough medication with codeine or hydrocodone or other narcotic please be advised that this contains a strong and  potentially addicting medication. Please follow instructions carefully, take as little as possible and only use AS NEEDED for severe cough. Discuss potential side effects with your pharmacy. Please do not drive or operate machinery while taking these types of medications. Please do not take other sedating medications, drugs or alcohol while taking this medication without discussing with your doctor.  -for sore throat, salt water gargles can  help  -follow up if you have fevers, facial pain, tooth pain, difficulty breathing or are worsening or symptoms persist longer then expected  Upper Respiratory Infection, Adult An upper respiratory infection (URI) is also known as the common cold. It is often caused by a type of germ (virus). Colds are easily spread (contagious). You can pass it to others by kissing, coughing, sneezing, or drinking out of the same glass. Usually, you get better in 1 to 3  weeks.  However, the cough can last for even longer. HOME CARE   Only take medicine as told by your doctor. Follow instructions provided above.  Drink enough water and fluids to keep your pee (urine) clear or pale yellow.  Get plenty of rest.  Return to work when your temperature is < 100 for 24 hours or as told by your doctor. You may use a face mask and wash your hands to stop your cold from spreading. GET HELP RIGHT AWAY IF:   After the first few days, you feel you are getting worse.  You have questions about your medicine.  You have chills, shortness of breath, or red spit (mucus).  You have pain in the face for more then 1-2 days, especially when you bend forward.  You have a fever, puffy (swollen) neck, pain when you swallow, or white spots in the back of your throat.  You have a bad headache, ear pain, sinus pain, or chest pain.  You have a high-pitched whistling sound when you breathe in and out (wheezing).  You cough up blood.  You have sore muscles or  a stiff neck. MAKE SURE YOU:   Understand these instructions.  Will watch your condition.  Will get help right away if you are not doing well or get worse. Document Released: 07/17/2007 Document Revised: 04/22/2011 Document Reviewed: 05/05/2013 [facility] Patient Information 2015 [place], [place]. This information is not intended to replace advice given to you by your health care provider. Make sure you discuss any questions you have with your health care provider.

## 2014-12-05 ENCOUNTER — Other Ambulatory Visit: Payer: Self-pay

## 2014-12-05 ENCOUNTER — Telehealth: Payer: Self-pay | Admitting: Family Medicine

## 2014-12-05 MED ORDER — METFORMIN HCL 500 MG PO TABS: ORAL_TABLET | ORAL | Status: DC

## 2014-12-05 NOTE — Telephone Encounter (Signed)
Pt was seen on 12/02/14 and still waiting on metformin to be send [facility] ,

## 2014-12-05 NOTE — Telephone Encounter (Signed)
I called the pt and informed her the Rx was sent to her pharmacy. 

## 2014-12-19 ENCOUNTER — Ambulatory Visit (INDEPENDENT_AMBULATORY_CARE_PROVIDER_SITE_OTHER): Payer: BLUE CROSS/BLUE SHIELD | Admitting: Gynecology

## 2014-12-19 ENCOUNTER — Encounter: Payer: Self-pay | Admitting: Gynecology

## 2014-12-19 VITALS — BP 130/84 | Ht 66.0 in | Wt 381.0 lb

## 2014-12-19 DIAGNOSIS — Z01419 Encounter for gynecological examination (general) (routine) without abnormal findings: Secondary | ICD-10-CM

## 2014-12-19 DIAGNOSIS — Z30431 Encounter for routine checking of intrauterine contraceptive device: Secondary | ICD-10-CM | POA: Diagnosis not present

## 2014-12-19 NOTE — Progress Notes (Signed)
Jewel BaizeSuzanne McKenzie [facility] 05/17/1965 [id number]        49 y.o.  G2P2  No LMP recorded. Patient is not currently having periods (Reason: IUD). for annual exam.  Doing well without complaints  Past medical history,surgical history, problem list, medications, allergies, family history and social history were all reviewed and documented as reviewed in the EPIC chart.  ROS:  Performed with pertinent positives and negatives included in the history, assessment and plan.   Additional significant findings :  none   Exam: [facility] personassistant Filed Vitals:   12/19/14 0835  BP: 130/84  Height: 5\' 6"  (1.676 m)  Weight: 381 lb (172.82 kg)   General appearance:  Normal affect, orientation and appearance. Skin: Grossly normal HEENT: Without gross lesions.  No cervical or supraclavicular adenopathy. Thyroid normal.  Lungs:  Clear without wheezing, rales or rhonchi Cardiac: RR, without RMG Abdominal:  Soft, nontender, without masses, guarding, rebound, organomegaly or hernia Breasts:  Examined lying and sitting without masses, retractions, discharge or axillary adenopathy. Pelvic:  Ext/BUS/vagina normal  Cervix normal. IUD string visualized  Uterus difficult to palpate but no gross masses or tenderness  Adnexa  Without gross masses or tenderness    Anus and perineum  Normal   Rectovaginal  Normal sphincter tone without palpated masses or tenderness.    Assessment/Plan:  49 y.o. G2P2 female for annual exam without menses, Mirena IUD.   1. Mirena IUD 12/2013. Doing well without menses. IUD string visualized. 2. Mammography overdue and she agrees to call and schedule. SBE monthly reviewed. 3. Pap smear/HPV 2015 negative. No Pap smear done today.  No history of significant abnormal Pap smears 4. Health maintenance. Patient reports routine blood work done through her primary physician's office. Follow up in one year, sooner as needed.   Dara LordsFONTAINE,Brayton Baumgartner P MD, 8:56 AM 12/19/2014

## 2014-12-19 NOTE — Patient Instructions (Signed)

## 2015-03-13 ENCOUNTER — Ambulatory Visit (INDEPENDENT_AMBULATORY_CARE_PROVIDER_SITE_OTHER): Payer: BLUE CROSS/BLUE SHIELD | Admitting: Family Medicine

## 2015-03-13 ENCOUNTER — Encounter: Payer: Self-pay | Admitting: Family Medicine

## 2015-03-13 VITALS — BP 124/78 | HR 95 | Temp 98.5°F | Ht 66.0 in | Wt 372.5 lb

## 2015-03-13 DIAGNOSIS — E119 Type 2 diabetes mellitus without complications: Secondary | ICD-10-CM | POA: Diagnosis not present

## 2015-03-13 DIAGNOSIS — I10 Essential (primary) hypertension: Secondary | ICD-10-CM | POA: Diagnosis not present

## 2015-03-13 DIAGNOSIS — Z23 Encounter for immunization: Secondary | ICD-10-CM | POA: Diagnosis not present

## 2015-03-13 DIAGNOSIS — M25512 Pain in left shoulder: Secondary | ICD-10-CM

## 2015-03-13 DIAGNOSIS — E785 Hyperlipidemia, unspecified: Secondary | ICD-10-CM | POA: Diagnosis not present

## 2015-03-13 LAB — BASIC METABOLIC PANEL
BUN: 16 mg/dL (ref 6–23)
CHLORIDE: 102 meq/L (ref 96–112)
CO2: 33 mEq/L — ABNORMAL HIGH (ref 19–32)
Calcium: 9.3 mg/dL (ref 8.4–10.5)
Creatinine, Ser: 0.64 mg/dL (ref 0.40–1.20)
GFR: 104.58 mL/min (ref >60.00)
Glucose, Bld: 114 mg/dL — ABNORMAL HIGH (ref 70–99)
POTASSIUM: 4.8 meq/L (ref 3.5–5.1)
Sodium: 143 mEq/L (ref 135–145)

## 2015-03-13 LAB — MICROALBUMIN / CREATININE URINE RATIO
Creatinine,U: 92.4 mg/dL
MICROALB UR: 1.1 mg/dL (ref 0.0–1.9)
Microalb Creat Ratio: 1.2 mg/g (ref 0.0–30.0)

## 2015-03-13 LAB — HEMOGLOBIN A1C: HEMOGLOBIN A1C: 7 % — AB (ref 4.6–6.5)

## 2015-03-13 NOTE — Patient Instructions (Signed)
BEFORE YOU LEAVE: -pneumococcal vaccine -labs -follow up in 4 months  Get a diabetic eye exam every year and please bring a copy or have your eye doctor send [place] a copy  We recommend the following healthy lifestyle measures: - eat a healthy whole foods diet consisting of regular small meals composed of vegetables, fruits, beans, nuts, seeds, healthy meats such as white chicken and fish and whole grains.  - avoid sweets, white starchy foods, fried foods, fast food, processed foods, sodas, red meet and other fattening foods.  - get a least 150-300 minutes of aerobic exercise per week.   -We have ordered labs or studies at this visit. It can take up to 1-2 weeks for results and processing. We will contact you with instructions IF your results are abnormal. Normal results will be released to your [facility]. If you have not heard from [place] or can not find your results in [facility] in 2 weeks please contact our office.

## 2015-03-13 NOTE — Progress Notes (Signed)
Pre visit review using our clinic review tool, if applicable. No additional management support is needed unless otherwise documented below in the visit note. 

## 2015-03-13 NOTE — Progress Notes (Signed)
HPI:  [name] is a 50 yo F here for a follow up visit.  Diabetes: -new dx 10/2014 -complications: none -meds: metformin -diet and exercise: see below -denies: polyuria, polydipsia, vision changes  Hyperlipidemia/Obesity: -meds: crestor  -trying to do better with diet and exercise and lost ~9 lbs since last visit -no hx of statin intol  Hypertension: -meds: hctz -denies: CP, SOB, DOE, HA  GERD: -meds: ranitidine  L shoulder pain: -lateral superior shoulder -started about 1 week ago, now much better -denies: weakness, numbness, radiation, trauma, malaise, fevers  ROS: See pertinent positives and negatives per HPI.  Past Medical History  Diagnosis Date  . GERD (gastroesophageal reflux disease)   . Allergy   . Hypertension   . Diabetes (HCC)   . Hyperlipemia   . Obesity   . Venous insufficiency     Past Surgical History  Procedure Laterality Date  . Cholecystectomy    . Birth mark removed    . Intrauterine device insertion  12/31/2013    Mirena    Family History  Problem Relation Age of Onset  . Hypertension Father   . Diabetes Father   . Cancer Paternal Grandfather     Stomach  . Cancer Maternal Uncle     Pancreatic  . Cancer Paternal Uncle     Kidney    Social History   Social History  . Marital Status: Married    Spouse Name: N/A  . Number of Children: N/A  . Years of Education: N/A   Social History Main Topics  . Smoking status: Never Smoker   . Smokeless tobacco: None  . Alcohol Use: 0.0 oz/week    0 Standard drinks or equivalent per week     Comment: Rare  . Drug Use: No  . Sexual Activity: Yes    Birth Control/ Protection: IUD     Comment: Mirena 12/31/2013-1st intercourse 50 yo-Fewer than 5 partners   Other Topics Concern  . 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  fluticasone (FLONASE) 50 MCG/ACT nasal spray, Place into both nostrils daily., Disp: , Rfl:  .  hydrochlorothiazide (HYDRODIURIL) 25 MG tablet, Take 1 tablet (25 mg total) by mouth daily., Disp: 90 tablet, Rfl: 3 .  levonorgestrel (MIRENA) 20 MCG/24HR IUD, 1 each by Intrauterine route once., Disp: , Rfl:  .  metFORMIN (GLUCOPHAGE) 500 MG tablet, Take 1 tablet once a day for 1 week, then 1 twice a day thereafter, Disp: 60 tablet, Rfl: 3 .  rosuvastatin (CRESTOR) 5 MG tablet, Take 1 tablet (5 mg total) by mouth daily., Disp: 90 tablet, Rfl: 3  Current facility-administered medications:  .  levonorgestrel (MIRENA) 20 MCG/24HR IUD, , Intrauterine, Once, Timothy P [name], MD  EXAM:  Filed Vitals:   03/13/15 0818  BP: 124/78  Pulse: 95  Temp: 98.5 F (36.9 C)    Body mass index is 60.15 kg/(m^2).  GENERAL: vitals reviewed and listed above, alert, oriented, appears well hydrated and in no acute distress  HEENT: atraumatic, conjunttiva clear, no obvious abnormalities on inspection of external nose and ears  NECK: no obvious  masses on inspection  LUNGS: clear to auscultation bilaterally, no wheezes, rales or rhonchi, good air movement  CV: HRRR, no peripheral edema  MS: moves all extremities without noticeable abnormality, mild TTP deltoid L arm, no other abnormalities appreciated  PSYCH: pleasant and cooperative, no obvious depression or anxiety  ASSESSMENT AND PLAN:  Discussed the following assessment and plan:  Type 2 diabetes mellitus without complication, without long-term current use of insulin (HCC) - Plan: Hemoglobin A1c, Basic metabolic panel, Microalbumin/Creatinine Ratio, Urine -foot exam -advised yearly eye exam -pneumococcal vaccine -lifestyle recs -continue current medications  Hyperlipemia Morbid obesity, unspecified  obesity type (HCC) Essential hypertension -BMP -lifestyle recs -continue current medications  Left shoulder pain -likely muscular strain, resolving spontaneously -heat and stretching, advised f/u if persists in 2-3 weeks or new concerns  -Patient advised to return or notify a doctor immediately if symptoms worsen or persist or new concerns arise.  Patient Instructions  BEFORE YOU LEAVE: -pneumococcal vaccine -labs -follow up in 4 months  Get a diabetic eye exam every year and please bring a copy or have your eye doctor send [place] a copy  We recommend the following healthy lifestyle measures: - eat a healthy whole foods diet consisting of regular small meals composed of vegetables, fruits, beans, nuts, seeds, healthy meats such as white chicken and fish and whole grains.  - avoid sweets, white starchy foods, fried foods, fast food, processed foods, sodas, red meet and other fattening foods.  - get a least 150-300 minutes of aerobic exercise per week.   -We have ordered labs or studies at this visit. It can take up to 1-2 weeks for results and processing. We will contact you with instructions IF your results are abnormal. Normal results will be released to your [facility]. If you have not heard from [place] or can not find your results in [facility] in 2 weeks please contact our office.            Kriste Basque R.

## 2015-03-13 NOTE — Addendum Note (Signed)
Addended by: Johnella Moloney on: 03/13/2015 09:29 AM   Modules accepted: Orders

## 2015-03-26 ENCOUNTER — Other Ambulatory Visit: Payer: Self-pay | Admitting: Family Medicine

## 2015-07-07 ENCOUNTER — Ambulatory Visit: Payer: BLUE CROSS/BLUE SHIELD | Admitting: Family Medicine

## 2015-07-17 ENCOUNTER — Ambulatory Visit: Payer: BLUE CROSS/BLUE SHIELD | Admitting: Family Medicine

## 2015-07-24 LAB — HM DIABETES EYE EXAM

## 2015-07-27 ENCOUNTER — Other Ambulatory Visit: Payer: Self-pay | Admitting: Family Medicine

## 2015-07-29 ENCOUNTER — Other Ambulatory Visit: Payer: Self-pay | Admitting: Family Medicine

## 2015-08-28 ENCOUNTER — Ambulatory Visit (INDEPENDENT_AMBULATORY_CARE_PROVIDER_SITE_OTHER): Payer: Self-pay | Admitting: Family Medicine

## 2015-08-28 ENCOUNTER — Encounter: Payer: Self-pay | Admitting: Family Medicine

## 2015-08-28 VITALS — BP 136/82 | HR 74 | Temp 98.4°F | Ht 66.0 in | Wt 376.0 lb

## 2015-08-28 DIAGNOSIS — I10 Essential (primary) hypertension: Secondary | ICD-10-CM

## 2015-08-28 DIAGNOSIS — E119 Type 2 diabetes mellitus without complications: Secondary | ICD-10-CM

## 2015-08-28 DIAGNOSIS — R011 Cardiac murmur, unspecified: Secondary | ICD-10-CM

## 2015-08-28 DIAGNOSIS — E785 Hyperlipidemia, unspecified: Secondary | ICD-10-CM

## 2015-08-28 NOTE — Progress Notes (Signed)
HPI:  [name] is a 50 yo F here for a follow up visit. Insurance issues right now as between jobs. Wants to do labs in a few weeks.  Diabetes: -new dx 10/2014 -complications: none -meds: metformin -diet and exercise: poor -denies: polyuria, polydipsia, vision changes -eye exam: done, report scanned today  Hyperlipidemia/Obesity: -meds: crestor 5mg  -no hx of statin intol  Hypertension: -meds: hctz -denies: CP, SOB, DOE, HA  GERD: -meds: ranitidine  Heart murmur: -since child hood -no complaints, told benign -had exam [facility] for work and then heard the murmur and told her to follow up here -denies: CP, sOB, DOE, palpitations  ROS: See pertinent positives and negatives per HPI.  Past Medical History  Diagnosis Date  . GERD (gastroesophageal reflux disease)   . Allergy   . Hypertension   . Diabetes (HCC)   . Hyperlipemia   . Obesity   . Venous insufficiency   . Heart murmur 08/28/2015    Past Surgical History  Procedure Laterality Date  . Cholecystectomy    . Birth mark removed    . Intrauterine device insertion  12/31/2013    Mirena    Family History  Problem Relation Age of Onset  . Hypertension Father   . Diabetes Father   . Cancer Paternal Grandfather     Stomach  . Cancer Maternal Uncle     Pancreatic  . Cancer Paternal Uncle     Kidney    Social History   Social History  . Marital Status: Married    Spouse Name: N/A  . Number of Children: N/A  . Years of Education: N/A   Social History Main Topics  . Smoking status: Never Smoker   . Smokeless tobacco: None  . Alcohol Use: 0.0 oz/week    0 Standard drinks or equivalent per week     Comment: Rare  . Drug Use: No  . Sexual Activity: Yes    Birth Control/ Protection: IUD     Comment: Mirena 12/31/2013-1st intercourse 50 yo-Fewer than 5 partners   Other Topics Concern  . 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  fluticasone (FLONASE) 50 MCG/ACT nasal spray, Place into both nostrils daily., Disp: , Rfl:  .  hydrochlorothiazide (HYDRODIURIL) 25 MG tablet, TAKE 1 TABLET (25 MG TOTAL) BY MOUTH DAILY., Disp: 90 tablet, Rfl: 0 .  levonorgestrel (MIRENA) 20 MCG/24HR IUD, 1 each by Intrauterine route once., Disp: , Rfl:  .  metFORMIN (GLUCOPHAGE) 500 MG tablet, TAKE 1 TABLET ONCE A DAY FOR 1 WEEK, THEN 1 TWICE A DAY THEREAFTER, Disp: 60 tablet, Rfl: 1 .  rosuvastatin (CRESTOR) 5 MG tablet, Take 1 tablet (5 mg total) by mouth daily., Disp: 90 tablet, Rfl: 3  Current facility-administered medications:  .  levonorgestrel (MIRENA) 20 MCG/24HR IUD, , Intrauterine, Once, Dara Lordsimothy P [name], MD  EXAM:  Filed Vitals:   08/28/15 0905  BP: 136/82  Pulse: 74  Temp: 98.4 F (36.9 C)    Body mass index is 60.72 kg/(m^2).  GENERAL: vitals reviewed and listed above, alert, oriented, appears well hydrated and in no  acute distress  HEENT: atraumatic, conjunttiva clear, no obvious abnormalities on inspection of external nose and ears  NECK: no obvious masses on inspection  LUNGS: clear to auscultation bilaterally, no wheezes, rales or rhonchi, good air movement  CV: HRRR, no peripheral edema  MS: moves all extremities without noticeable abnormality  PSYCH: pleasant and cooperative, no obvious depression or anxiety  ASSESSMENT AND PLAN:  Discussed the following assessment and plan:  Type 2 diabetes mellitus without complication, without long-term current use of insulin (HCC)  Essential hypertension  Morbid obesity, unspecified obesity type (HCC)  Hyperlipemia  Heart murmur  -labs; she wants to do in a few weeks as is waiting on insurance -stress preventive and  treatment diet - Mediterranean and regular activity; she really needs to eat healthy and exercise to treat and prevent progression disease -preventive visit due in September -mammo and colon ca screening information and scheduling info provided -Patient advised to return or notify a doctor immediately if symptoms worsen or persist or new concerns arise.  Patient Instructions  BEFORE YOU LEAVE: -follow up: physical in 4-6 months -lab only visit for fasting labs in 2-8 weeks pending patient preference   We recommend the following healthy lifestyle: 1) Small portions - eat off of salad plate instead of dinner plate 2) Eat a healthy clean diet with avoidance of (less then 1 serving per week) processed foods, sweetened drinks, white starches, red meat, fast foods and sweets and consisting of: * 5-9 servings per day of fresh or frozen fruits and vegetables (not corn or potatoes, not dried or canned) *nuts and seeds, beans *olives and olive oil *small portions of lean meats such as fish and white chicken  *small portions of whole grains 3)Get at least 150 minutes of sweaty aerobic exercise per week 4)reduce stress - counseling, meditation, relaxation to balance other aspects of your life Good website: [email or web address]  We have ordered labs or studies at this visit. It can take up to 1-2 weeks for results and processing. IF results require follow up or explanation, we will call you with instructions. Clinically stable results will be released to your [facility]. If you have not heard from [place] or cannot find your results in [facility] in 2 weeks please contact our office at [phone].  If you are not yet signed up for [facility], please consider signing up.            Kriste Basque R., DO

## 2015-08-28 NOTE — Patient Instructions (Signed)
BEFORE YOU LEAVE: -follow up: physical in 4-6 months -lab only visit for fasting labs in 2-8 weeks pending patient preference   We recommend the following healthy lifestyle: 1) Small portions - eat off of salad plate instead of dinner plate 2) Eat a healthy clean diet with avoidance of (less then 1 serving per week) processed foods, sweetened drinks, white starches, red meat, fast foods and sweets and consisting of: * 5-9 servings per day of fresh or frozen fruits and vegetables (not corn or potatoes, not dried or canned) *nuts and seeds, beans *olives and olive oil *small portions of lean meats such as fish and white chicken  *small portions of whole grains 3)Get at least 150 minutes of sweaty aerobic exercise per week 4)reduce stress - counseling, meditation, relaxation to balance other aspects of your life Good website: [email or web address]  We have ordered labs or studies at this visit. It can take up to 1-2 weeks for results and processing. IF results require follow up or explanation, we will call you with instructions. Clinically stable results will be released to your [facility]. If you have not heard from us or cannot find your results in [facility] in 2 weeks please contact our office at [phone].  If you are not yet signed up for [facility], please consider signing up.

## 2015-08-28 NOTE — Progress Notes (Signed)
Pre visit review using our clinic review tool, if applicable. No additional management support is needed unless otherwise documented below in the visit note. 

## 2015-09-25 ENCOUNTER — Other Ambulatory Visit (INDEPENDENT_AMBULATORY_CARE_PROVIDER_SITE_OTHER): Payer: BLUE CROSS/BLUE SHIELD

## 2015-09-25 DIAGNOSIS — E119 Type 2 diabetes mellitus without complications: Secondary | ICD-10-CM | POA: Diagnosis not present

## 2015-09-25 DIAGNOSIS — E785 Hyperlipidemia, unspecified: Secondary | ICD-10-CM

## 2015-09-25 DIAGNOSIS — I10 Essential (primary) hypertension: Secondary | ICD-10-CM | POA: Diagnosis not present

## 2015-09-25 LAB — CBC
HCT: 44 % (ref 36.0–46.0)
Hemoglobin: 14.5 g/dL (ref 12.0–15.0)
MCHC: 33 g/dL (ref 30.0–36.0)
MCV: 86 fl (ref 78.0–100.0)
Platelets: 258 10*3/uL (ref 150.0–400.0)
RBC: 5.11 Mil/uL (ref 3.87–5.11)
RDW: 17.2 % — AB (ref 11.5–15.5)
WBC: 7.7 10*3/uL (ref 4.0–10.5)

## 2015-09-25 LAB — BASIC METABOLIC PANEL
BUN: 10 mg/dL (ref 6–23)
CALCIUM: 8.9 mg/dL (ref 8.4–10.5)
CO2: 33 mEq/L — ABNORMAL HIGH (ref 19–32)
Chloride: 100 mEq/L (ref 96–112)
Creatinine, Ser: 0.58 mg/dL (ref 0.40–1.20)
GFR: 116.91 mL/min (ref >60.00)
GLUCOSE: 107 mg/dL — AB (ref 70–99)
Potassium: 3.8 mEq/L (ref 3.5–5.1)
Sodium: 141 mEq/L (ref 135–145)

## 2015-09-25 LAB — LIPID PANEL
CHOL/HDL RATIO: 3
Cholesterol: 133 mg/dL (ref 0–200)
HDL: 47.3 mg/dL (ref >39.00)
LDL CALC: 68 mg/dL (ref 0–99)
NONHDL: 86.13
Triglycerides: 92 mg/dL (ref 0.0–149.0)
VLDL: 18.4 mg/dL (ref 0.0–40.0)

## 2015-09-25 LAB — HEMOGLOBIN A1C: Hgb A1c MFr Bld: 6.6 % — ABNORMAL HIGH (ref 4.6–6.5)

## 2015-09-25 LAB — MICROALBUMIN / CREATININE URINE RATIO
Creatinine,U: 71.1 mg/dL
Microalb Creat Ratio: 1 mg/g (ref 0.0–30.0)
Microalb, Ur: 0.7 mg/dL (ref 0.0–1.9)

## 2015-10-04 ENCOUNTER — Other Ambulatory Visit: Payer: Self-pay | Admitting: Family Medicine

## 2015-10-26 ENCOUNTER — Other Ambulatory Visit: Payer: Self-pay | Admitting: Family Medicine

## 2015-11-10 ENCOUNTER — Other Ambulatory Visit: Payer: Self-pay | Admitting: *Deleted

## 2015-11-10 MED ORDER — METFORMIN HCL 500 MG PO TABS: 500.0000 mg | ORAL_TABLET | Freq: Two times a day (BID) | ORAL | 2 refills | Status: DC

## 2015-11-10 NOTE — Telephone Encounter (Signed)
Rx done. 

## 2015-11-24 ENCOUNTER — Other Ambulatory Visit: Payer: Self-pay | Admitting: Family Medicine

## 2015-12-20 ENCOUNTER — Encounter: Payer: BLUE CROSS/BLUE SHIELD | Admitting: Gynecology

## 2016-03-01 ENCOUNTER — Encounter: Payer: Self-pay | Admitting: Family Medicine

## 2016-03-02 LAB — HM DIABETES EYE EXAM

## 2016-03-04 ENCOUNTER — Telehealth: Payer: Self-pay | Admitting: Family Medicine

## 2016-03-04 NOTE — Telephone Encounter (Signed)
Pt would like to come in to have her CPE done on Thurs 03/14/16 is it okay to use the 9:00 slots it was rescheduled due to the snow and she is not able to come in on 03/05/16.

## 2016-03-04 NOTE — Telephone Encounter (Signed)
Ok to use. Thanks

## 2016-03-04 NOTE — Progress Notes (Signed)
HPI:  Wanda ChurnSuzanne McKenzie Morris is a pleasant 51 year old with a past medical history significant for diabetes, obesity, hyperlipidemia, hypertension, allergic rhinitis and acid reflux here for an physical, but prefers to change this to an acute visit to address several issues she has had recently.  URI: -Has had what sounds like 3 respiratory illnesses in the last 2 months - each one without a fever or severe symptoms, but was seen in urgent care and was given antibiotics and prednisone each time. Each symptoms resolved in the typical timeframe for a viral illness. During one of the urgent care visits, she was diagnosed with pneumonia per her report, without an x-ray and was given Levaquin. Her symptoms resolved. 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  Past Medical History:  Diagnosis Date  . Allergy   . Diabetes (HCC)   . GERD (gastroesophageal reflux disease)   . Heart murmur 08/28/2015  . Hyperlipemia   . Hypertension   . Obesity   . Venous insufficiency     Past Surgical History:  Procedure Laterality Date  . Birth Loraine LericheMark removed    . CHOLECYSTECTOMY    . INTRAUTERINE DEVICE INSERTION  12/31/2013   Mirena    Family History  Problem Relation Age of Onset  . Hypertension Father   . Diabetes Father   . Cancer Paternal Grandfather     Stomach  . Cancer Maternal Uncle     Pancreatic  . Cancer  Paternal Uncle     Kidney    Social History   Social History  . Marital status: Married    Spouse name: N/A  . Number of children: N/A  . Years of education: N/A   Social History Main Topics  . Smoking status: Never Smoker  . Smokeless tobacco: Never Used  . Alcohol use 0.0 oz/week     Comment: Rare  . Drug use: No  . Sexual activity: Yes    Birth control/ protection: IUD     Comment: Mirena 12/31/2013-1st intercourse 51 yo-Fewer than 5 partners   Other Topics Concern  . 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  benzonatate (TESSALON) 100 MG capsule, take 1 by mouth at bedtime, Disp: , Rfl: 0 .  fluticasone (FLONASE) 50 MCG/ACT nasal spray, Place into both nostrils daily., Disp: , Rfl:  .  hydrochlorothiazide (HYDRODIURIL) 25 MG tablet, TAKE 1 TABLET (25 MG TOTAL) BY MOUTH DAILY., Disp: 90 tablet, Rfl: 2 .  levonorgestrel (MIRENA) 20 MCG/24HR IUD, 1 each by Intrauterine route  once., Disp: , Rfl:  .  metFORMIN (GLUCOPHAGE) 500 MG tablet, Take 1 tablet (500 mg total) by mouth 2 (two) times daily with a meal., Disp: 180 tablet, Rfl: 2 .  PROAIR HFA 108 (90 Base) MCG/ACT inhaler, INHALE 1-2 PUFFS EVERY 4-6 HOURS AS NEEDED FOR WHEEZING, Disp: , Rfl: 0 .  Probiotic Product (PROBIOTIC PO), Take by mouth daily. , Disp: , Rfl:  .  rosuvastatin (CRESTOR) 5 MG tablet, TAKE 1 TABLET (5 MG TOTAL) BY MOUTH DAILY., Disp: 90 tablet, Rfl: 3  Current Facility-Administered Medications:  .  levonorgestrel (MIRENA) 20 MCG/24HR IUD, , Intrauterine, Once, Timothy P [name], MD  EXAM:  Vitals:   03/05/16 0929  BP: 130/80  Pulse: 77  Temp: 98.3 F (36.8 C)    Body mass index is 63.49  kg/m.  GENERAL: vitals reviewed and listed above, alert, oriented, appears well hydrated and in no acute distress  HEENT: atraumatic, conjunttiva clear, no obvious abnormalities on inspection of external nose and ears, normal appearance of ear canals and TMs, clear nasal congestion, mild post oropharyngeal erythema with PND, no tonsillar edema or exudate, no sinus TTP  NECK: no obvious masses on inspection  LUNGS: clear to auscultation bilaterally, no wheezes, rales or rhonchi, good air movement  CV: HRRR, no peripheral edema  MS: moves all extremities without noticeable abnormality  PSYCH: pleasant and cooperative, no obvious depression or anxiety  ASSESSMENT AND PLAN:  Discussed the following assessment and plan:  Cough Respiratory illness -given HPI and exam findings today, a serious infection or illness is unlikely. We discussed potential etiologies, with VURI being most likely, and advised supportive care and monitoring. I did not see her for her other illnesses, but these may have been viral as well. We discussed treatment side effects, likely course, antibiotic misuse, transmission, and signs of developing a serious illness. -of course, we advised to return or notify a doctor immediately if symptoms worsen or persist or new concerns arise.  Acute pain of left knee -suspect contusion, since symptoms resolved with monitor  Encounter for herb and vitamin supplement management  She has questions about supplements, counseled on quality issues and advised if were to use reported uses, safety concerns and products approved in recent [employer] reviews.  She does not want to do labs after recently being on prednisone. Will plan to check at physical in 3 weeks.     Patient Instructions  BEFORE YOU LEAVE: -follow up: Reschedule Physical Exam in 3 weeks - come fasting (can work in for her convenience)  Vit D3 1000 IU daily ([employer] club brand)  Vit C drops - [employer]  brand  Align probiotic  INSTRUCTIONS FOR UPPER RESPIRATORY INFECTION:  -plenty of rest and fluids  -nasal saline wash 2-3 times daily (use prepackaged nasal saline or bottled/distilled water if making your own)   -can use AFRIN nasal spray for drainage and nasal congestion - but do NOT use longer then 3-4 days  -can use tylenol (in no history of liver disease) or ibuprofen (if no history of kidney disease, bowel bleeding or significant heart disease) as directed for aches and sorethroat  -in the winter time, using a humidifier at night is helpful (please follow cleaning instructions)  -if you are taking a cough medication - use only as directed, may also try a teaspoon of honey to coat the throat and throat lozenges.   Can use albuterol if any slight wheeze   -for sore throat, salt water gargles can help  -  follow up if you have fevers, facial pain, tooth pain, difficulty breathing or are worsening or symptoms persist longer then expected  Upper Respiratory Infection, Adult An upper respiratory infection (URI) is also known as the common cold. It is often caused by a type of germ (virus). Colds are easily spread (contagious). You can pass it to others by kissing, coughing, sneezing, or drinking out of the same glass. Usually, you get better in 1 to 3  weeks.  However, the cough can last for even longer. HOME CARE   Only take medicine as told by your doctor. Follow instructions provided above.  Drink enough water and fluids to keep your pee (urine) clear or pale yellow.  Get plenty of rest.  Return to work when your temperature is < 100 for 24 hours or as told by your doctor. You may use a face mask and wash your hands to stop your cold from spreading. GET HELP RIGHT AWAY IF:   After the first few days, you feel you are getting worse.  You have questions about your medicine.  You have chills, shortness of breath, or red spit (mucus).  You have pain in the face for more then  1-2 days, especially when you bend forward.  You have a fever, puffy (swollen) neck, pain when you swallow, or white spots in the back of your throat.  You have a bad headache, ear pain, sinus pain, or chest pain.  You have a high-pitched whistling sound when you breathe in and out (wheezing).  You cough up blood.  You have sore muscles or a stiff neck. MAKE SURE YOU:   Understand these instructions.  Will watch your condition.  Will get help right away if you are not doing well or get worse. Document Released: 07/17/2007 Document Revised: 04/22/2011 Document Reviewed: 05/05/2013 [facility] Patient Information 2015 [place], [place]. This information is not intended to replace advice given to you by your health care provider. Make sure you discuss any questions you have with your health care provider.     Kriste Basque R., DO

## 2016-03-05 ENCOUNTER — Encounter: Payer: Self-pay | Admitting: Family Medicine

## 2016-03-05 ENCOUNTER — Ambulatory Visit (INDEPENDENT_AMBULATORY_CARE_PROVIDER_SITE_OTHER): Payer: BC Managed Care – PPO | Admitting: Family Medicine

## 2016-03-05 VITALS — BP 130/80 | HR 77 | Temp 98.3°F | Ht 65.25 in | Wt 384.5 lb

## 2016-03-05 DIAGNOSIS — J989 Respiratory disorder, unspecified: Secondary | ICD-10-CM

## 2016-03-05 DIAGNOSIS — Z7189 Other specified counseling: Secondary | ICD-10-CM

## 2016-03-05 DIAGNOSIS — R059 Cough, unspecified: Secondary | ICD-10-CM

## 2016-03-05 DIAGNOSIS — M25562 Pain in left knee: Secondary | ICD-10-CM | POA: Diagnosis not present

## 2016-03-05 DIAGNOSIS — R05 Cough: Secondary | ICD-10-CM

## 2016-03-05 NOTE — Progress Notes (Signed)
Pre visit review using our clinic review tool, if applicable. No additional management support is needed unless otherwise documented below in the visit note. 

## 2016-03-05 NOTE — Telephone Encounter (Signed)
Pt will keep appt today

## 2016-03-05 NOTE — Patient Instructions (Addendum)
BEFORE YOU LEAVE: -follow up: Reschedule Physical Exam in 3 weeks - come fasting (can work in for her convenience)  Vit D3 1000 IU daily ([employer] club brand)  Vit C drops - [employer] brand  Align probiotic  INSTRUCTIONS FOR UPPER RESPIRATORY INFECTION:  -plenty of rest and fluids  -nasal saline wash 2-3 times daily (use prepackaged nasal saline or bottled/distilled water if making your own)   -can use AFRIN nasal spray for drainage and nasal congestion - but do NOT use longer then 3-4 days  -can use tylenol (in no history of liver disease) or ibuprofen (if no history of kidney disease, bowel bleeding or significant heart disease) as directed for aches and sorethroat  -in the winter time, using a humidifier at night is helpful (please follow cleaning instructions)  -if you are taking a cough medication - use only as directed, may also try a teaspoon of honey to coat the throat and throat lozenges.   Can use albuterol if any slight wheeze   -for sore throat, salt water gargles can help  -follow up if you have fevers, facial pain, tooth pain, difficulty breathing or are worsening or symptoms persist longer then expected  Upper Respiratory Infection, Adult An upper respiratory infection (URI) is also known as the common cold. It is often caused by a type of germ (virus). Colds are easily spread (contagious). You can pass it to others by kissing, coughing, sneezing, or drinking out of the same glass. Usually, you get better in 1 to 3  weeks.  However, the cough can last for even longer. HOME CARE   Only take medicine as told by your doctor. Follow instructions provided above.  Drink enough water and fluids to keep your pee (urine) clear or pale yellow.  Get plenty of rest.  Return to work when your temperature is < 100 for 24 hours or as told by your doctor. You may use a face mask and wash your hands to stop your cold from spreading. GET HELP RIGHT AWAY IF:   After the first few  days, you feel you are getting worse.  You have questions about your medicine.  You have chills, shortness of breath, or red spit (mucus).  You have pain in the face for more then 1-2 days, especially when you bend forward.  You have a fever, puffy (swollen) neck, pain when you swallow, or white spots in the back of your throat.  You have a bad headache, ear pain, sinus pain, or chest pain.  You have a high-pitched whistling sound when you breathe in and out (wheezing).  You cough up blood.  You have sore muscles or a stiff neck. MAKE SURE YOU:   Understand these instructions.  Will watch your condition.  Will get help right away if you are not doing well or get worse. Document Released: 07/17/2007 Document Revised: 04/22/2011 Document Reviewed: 05/05/2013 [facility] Patient Information 2015 [place]. This information is not intended to replace advice given to you by your health care provider. Make sure you discuss any questions you have with your health care provider.

## 2016-03-07 ENCOUNTER — Encounter: Payer: Self-pay | Admitting: Family Medicine

## 2016-03-25 ENCOUNTER — Other Ambulatory Visit: Payer: Self-pay | Admitting: Gynecology

## 2016-03-25 DIAGNOSIS — Z1231 Encounter for screening mammogram for malignant neoplasm of breast: Secondary | ICD-10-CM

## 2016-03-26 ENCOUNTER — Other Ambulatory Visit: Payer: Self-pay | Admitting: Gynecology

## 2016-03-26 ENCOUNTER — Encounter: Payer: BC Managed Care – PPO | Admitting: Family Medicine

## 2016-03-26 ENCOUNTER — Ambulatory Visit
Admission: RE | Admit: 2016-03-26 | Discharge: 2016-03-26 | Disposition: A | Discharge status: Home / Self Care | Payer: BC Managed Care – PPO | Source: Ambulatory Visit | Attending: Gynecology | Admitting: Gynecology

## 2016-03-26 DIAGNOSIS — Z1231 Encounter for screening mammogram for malignant neoplasm of breast: Secondary | ICD-10-CM

## 2016-04-11 NOTE — Progress Notes (Signed)
HPI:  Here for CPE:  -Concerns and/or follow up today: none Recently seen for URI and L knee pain. Reports these issues improved, though feels like mild upper resp symptoms again the last few days - nasal congestion, PND. PMH significant for morbid obesity, diabetes, hyperlipidemia, hypertension, GERD and seasonal allergies. Reports diet and exercise have not been good. Due for labs, foot exam, colon ca screening  -Taking folic acid, vitamin D or calcium:  taking vitamin D  -Diabetes and Dyslipidemia Screening: Due for labs, fasting  -Vaccines: UTD  -pap history: w/ Dr. Phineas Real, 12/2013 pap and hpv neg  -sexual activity: yes, female partner, no new partners  -wants STI testing (Hep C if born [birth year]): no  -FH breast, colon or ovarian ca: see FH Last mammogram: 03/2016 birads 1 Last colon cancer screening: Due for colon cancer screening she agrees to Cologuard testing after discussion of options   -Alcohol, Tobacco, drug use: see social history  Review of Systems - no fevers, unintentional weight loss, vision loss, hearing loss, chest pain, sob, hemoptysis, melena, hematochezia, hematuria, genital discharge, changing or concerning skin lesions, bleeding, bruising, loc, thoughts of self harm or SI  Past Medical History:  Diagnosis Date  . Allergy   . Diabetes ([place])   . GERD (gastroesophageal reflux disease)   . Heart murmur 08/28/2015  . Hyperlipemia   . Hypertension   . Obesity   . Venous insufficiency     Past Surgical History:  Procedure Laterality Date  . Birth Elta [place] removed    . CHOLECYSTECTOMY    . INTRAUTERINE DEVICE INSERTION  12/31/2013   Mirena    Family History  Problem Relation Age of Onset  . Hypertension Father   . Diabetes Father   . Cancer Paternal Grandfather     Stomach  . Cancer Maternal Uncle     Pancreatic  . Cancer Paternal Uncle     Kidney    Social History   Social History  . Marital status: Married    Spouse name: N/A  . Number  of children: N/A  . Years of education: N/A   Social History Main Topics  . Smoking status: Never Smoker  . Smokeless tobacco: Never Used  . Alcohol use 0.0 oz/week     Comment: Rare  . Drug use: No  . Sexual activity: Yes    Birth control/ protection: IUD     Comment: Mirena 12/31/2013-1st intercourse 51 yo-Fewer than 5 partners   Other Topics Concern  . 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  Ascorbic Acid (VITAMIN C PO), Take by mouth., Disp: , Rfl:  .  Cholecalciferol (VITAMIN D3 PO), Take by mouth., Disp: , Rfl:  .  fluticasone (FLONASE) 50 MCG/ACT nasal spray, Place into both nostrils daily., Disp: , Rfl:  .  hydrochlorothiazide (HYDRODIURIL) 25 MG tablet, TAKE 1 TABLET (25 MG TOTAL) BY MOUTH DAILY., Disp: 90 tablet, Rfl: 2 .  levonorgestrel (MIRENA) 20 MCG/24HR IUD, 1 each by Intrauterine route once., Disp: , Rfl:  .  metFORMIN (GLUCOPHAGE) 500 MG tablet, Take 1 tablet (500 mg total) by mouth 2 (  two) times daily with a meal., Disp: 180 tablet, Rfl: 2 .  Probiotic Product (PROBIOTIC PO), Take by mouth daily. , Disp: , Rfl:  .  rosuvastatin (CRESTOR) 5 MG tablet, TAKE 1 TABLET (5 MG TOTAL) BY MOUTH DAILY., Disp: 90 tablet, Rfl: 3  Current Facility-Administered Medications:  .  levonorgestrel (MIRENA) 20 MCG/24HR IUD, , Intrauterine, Once, Timothy P [name], MD  EXAM:  Vitals:   04/12/16 0923  BP: 120/82  Pulse: 71  Temp: 98.2 F (36.8 C)    GENERAL: vitals reviewed and listed below, alert, oriented, appears well hydrated and in no acute distress  HEENT: head atraumatic, PERRLA, normal appearance of eyes, ears, nose and mouth. moist mucus membranes, normal appearance of ear  canals and TMs, clear nasal congestion, mild post oropharyngeal erythema with PND, no tonsillar edema or exudate, no sinus TTP  NECK: supple, no masses or lymphadenopathy  LUNGS: clear to auscultation bilaterally, no rales, rhonchi or wheeze  CV: HRRR, no peripheral edema or cyanosis, normal pedal pulses  ABDOMEN: bowel sounds normal, soft, non tender to palpation, no masses, no rebound or guarding  GU/BREAST: declined, does with gyn  SKIN: no rash or abnormal lesions  MS: normal gait, moves all extremities normally  NEURO: normal gait, speech and thought processing grossly intact, muscle tone grossly intact throughout  PSYCH: normal affect, pleasant and cooperative  ASSESSMENT AND PLAN:  Discussed the following assessment and plan:  Encounter for preventive health examination  Morbid obesity ([place])  Essential hypertension - Plan: Basic metabolic panel, CBC  Type 2 diabetes mellitus without complication, without long-term current use of insulin (HCC) - Plan: Hemoglobin A1c, Microalbumin / creatinine urine ratio  Hyperlipidemia, unspecified hyperlipidemia type  -Discussed and advised all [place] preventive services health task force level A and B recommendations for age, sex and risks.  -Advised at least 150 minutes of exercise per week and a healthy diet with avoidance of (less then 1 serving per week) processed foods, white starches, red meat, fast foods and sweets and consisting of: * 5-9 servings of fresh fruits and vegetables (not corn or potatoes) *nuts and seeds, beans *olives and olive oil *lean meats such as fish and white chicken  *whole grains  -labs, studies and vaccines per orders this encounter  Orders Placed This Encounter  Procedures  . Basic metabolic panel  . CBC  . Hemoglobin A1c  . Microalbumin / creatinine urine ratio    Patient advised to return to clinic immediately if symptoms worsen or persist or new concerns.  Patient Instructions  BEFORE YOU  LEAVE: -referral to Lorra Hals if patient wishes -labs -order cologard -follow up: 3-4 months  Try adding allegra or claritin to your allergy regimen. If you develop thick discolored nasal congestion for more then a few days, fevers, trouble breathing or sinus pain for more then a few days please follow up promptly!  We ordered the Cologuard test for colon cancer screening. Please complete this test promptly once the kit arrives. Please contact us if you have not received your kit in the next few weeks.  Please schedule your yearly gynecological exam.  We have ordered labs or studies at this visit. It can take up to 1-2 weeks for results and processing. IF results require follow up or explanation, we will call you with instructions. Clinically stable results will be released to your [facility]. If you have not heard from [place] or cannot find your results in [facility] in 2 weeks please contact our office at  [phone].  If you are not yet signed up for [facility], please consider signing up.   We recommend the following healthy lifestyle for LIFE: 1) Small portions.   Tip: eat off of a salad plate instead of a dinner plate.  Tip: It is ok to feel hungry after a meal of proper portion sizes  Tip: if you need more or a snack choose fruits, veggies and/or a handful of nuts or seeds.  2) Eat a healthy clean diet.  * Tip: Avoid (less then 1 serving per week): processed foods, sweets, sweetened drinks, white starches (rice, flour, bread, potatoes, pasta, etc), red meat, fast foods, butter  *Tip: CHOOSE instead   * 5-9 servings per day of fresh or frozen fruits and vegetables (but not corn, potatoes, bananas, canned or dried fruit)   *nuts and seeds, beans   *olives and olive oil   *small portions of lean meats such as fish and white chicken    *small portions of whole grains  3)Get at least 150 minutes of sweaty aerobic exercise per week.  4)Reduce stress - consider counseling, meditation and  relaxation to balance other aspects of your life.            No Follow-up on file.  Colin Benton R., DO

## 2016-04-12 ENCOUNTER — Ambulatory Visit (INDEPENDENT_AMBULATORY_CARE_PROVIDER_SITE_OTHER): Payer: BC Managed Care – PPO | Admitting: Family Medicine

## 2016-04-12 ENCOUNTER — Encounter: Payer: Self-pay | Admitting: Family Medicine

## 2016-04-12 VITALS — BP 120/82 | HR 71 | Temp 98.2°F | Ht 65.0 in | Wt 393.0 lb

## 2016-04-12 DIAGNOSIS — E119 Type 2 diabetes mellitus without complications: Secondary | ICD-10-CM

## 2016-04-12 DIAGNOSIS — E785 Hyperlipidemia, unspecified: Secondary | ICD-10-CM | POA: Diagnosis not present

## 2016-04-12 DIAGNOSIS — I10 Essential (primary) hypertension: Secondary | ICD-10-CM

## 2016-04-12 DIAGNOSIS — Z0000 Encounter for general adult medical examination without abnormal findings: Secondary | ICD-10-CM

## 2016-04-12 LAB — BASIC METABOLIC PANEL
BUN: 13 mg/dL (ref 6–23)
CALCIUM: 9.4 mg/dL (ref 8.4–10.5)
CO2: 35 mEq/L — ABNORMAL HIGH (ref 19–32)
Chloride: 98 mEq/L (ref 96–112)
Creatinine, Ser: 0.56 mg/dL (ref 0.40–1.20)
GFR: 121.47 mL/min (ref >60.00)
Glucose, Bld: 101 mg/dL — ABNORMAL HIGH (ref 70–99)
Potassium: 3.9 mEq/L (ref 3.5–5.1)
SODIUM: 140 meq/L (ref 135–145)

## 2016-04-12 LAB — CBC
HCT: 43.6 % (ref 36.0–46.0)
Hemoglobin: 14 g/dL (ref 12.0–15.0)
MCHC: 32 g/dL (ref 30.0–36.0)
MCV: 87.6 fl (ref 78.0–100.0)
PLATELETS: 302 10*3/uL (ref 150.0–400.0)
RBC: 4.98 Mil/uL (ref 3.87–5.11)
RDW: 16.7 % — ABNORMAL HIGH (ref 11.5–15.5)
WBC: 8.1 10*3/uL (ref 4.0–10.5)

## 2016-04-12 LAB — MICROALBUMIN / CREATININE URINE RATIO
CREATININE, U: 44.8 mg/dL
MICROALB/CREAT RATIO: 1.6 mg/g (ref 0.0–30.0)

## 2016-04-12 LAB — HEMOGLOBIN A1C: Hgb A1c MFr Bld: 7.8 % — ABNORMAL HIGH (ref 4.6–6.5)

## 2016-04-12 NOTE — Progress Notes (Signed)
Pre visit review using our clinic review tool, if applicable. No additional management support is needed unless otherwise documented below in the visit note. 

## 2016-04-12 NOTE — Patient Instructions (Signed)
BEFORE YOU LEAVE: -referral to Lorra Hals if patient wishes -labs -order cologard -follow up: 3-4 months  Try adding allegra or claritin to your allergy regimen. If you develop thick discolored nasal congestion for more then a few days, fevers, trouble breathing or sinus pain for more then a few days please follow up promptly!  We ordered the Cologuard test for colon cancer screening. Please complete this test promptly once the kit arrives. Please contact us if you have not received your kit in the next few weeks.  Please schedule your yearly gynecological exam.  We have ordered labs or studies at this visit. It can take up to 1-2 weeks for results and processing. IF results require follow up or explanation, we will call you with instructions. Clinically stable results will be released to your [facility]. If you have not heard from [place] or cannot find your results in [facility] in 2 weeks please contact our office at [phone].  If you are not yet signed up for [facility], please consider signing up.   We recommend the following healthy lifestyle for LIFE: 1) Small portions.   Tip: eat off of a salad plate instead of a dinner plate.  Tip: It is ok to feel hungry after a meal of proper portion sizes  Tip: if you need more or a snack choose fruits, veggies and/or a handful of nuts or seeds.  2) Eat a healthy clean diet.  * Tip: Avoid (less then 1 serving per week): processed foods, sweets, sweetened drinks, white starches (rice, flour, bread, potatoes, pasta, etc), red meat, fast foods, butter  *Tip: CHOOSE instead   * 5-9 servings per day of fresh or frozen fruits and vegetables (but not corn, potatoes, bananas, canned or dried fruit)   *nuts and seeds, beans   *olives and olive oil   *small portions of lean meats such as fish and white chicken    *small portions of whole grains  3)Get at least 150 minutes of sweaty aerobic exercise per week.  4)Reduce stress - consider counseling,  meditation and relaxation to balance other aspects of your life.

## 2016-04-16 ENCOUNTER — Telehealth: Payer: Self-pay | Admitting: *Deleted

## 2016-04-16 NOTE — Telephone Encounter (Signed)
-----   Message from Velva HarmanSavannah L Sharpe sent at 04/16/2016 10:35 AM EST ----- Regarding: FW: weight loss appt for a [place] patient Please have the pt call the office at [phone]. They will need to register for an information session with me. Our next information session will be on March 22nd at 5:30p.  I will check on our phone system to see if we have a problem as well. If the patient call on Friday the office is closed and my have received a message.  Feel free to email me or call anytime  [email or web address]  Thank you,  Zigmund GottronSavannah Sharpe ----- Message ----- From: Wilder Gladearen D Beasley, MD Sent: 04/16/2016  10:18 AM To: Velva HarmanSavannah L Sharpe Subject: FW: weight loss appt for a [place] patient     [facility]   CB ----- Message ----- From: Johnella MoloneyJo A Mikya Don, CMA Sent: 04/12/2016   9:59 AM To: Wilder Gladearen D Beasley, MD Subject: weight loss appt for a [place] patient         Hello, I am sending this message on behalf of Dr Selena BattenKim as she has a patient that is interested in seeing you for weight loss.  I called the phone number of [phone] and a voicemail stated you were not accepting calls?  Could you please let me know what this patient needs to do for an appointment or can our office place a referral? Narda AmberJo Anne,CMA

## 2016-04-16 NOTE — Telephone Encounter (Signed)
I called the pt and informed her of the message below

## 2016-06-13 ENCOUNTER — Other Ambulatory Visit: Payer: Self-pay | Admitting: Family Medicine

## 2016-06-14 ENCOUNTER — Other Ambulatory Visit: Payer: Self-pay | Admitting: *Deleted

## 2016-06-14 MED ORDER — METFORMIN HCL 1000 MG PO TABS: 1000.0000 mg | ORAL_TABLET | Freq: Two times a day (BID) | ORAL | 1 refills | Status: DC

## 2016-06-14 NOTE — Telephone Encounter (Signed)
Wanda PikesSusan called from [facility] for clarification on Metformin as the pt told her she is taking 1000mg  twice a day vs 500mg  twice a day.  I reviewed the labs from 3/4 and sent in the new dose.

## 2016-07-16 ENCOUNTER — Other Ambulatory Visit: Payer: Self-pay | Admitting: Family Medicine

## 2016-07-30 ENCOUNTER — Ambulatory Visit: Payer: BC Managed Care – PPO | Admitting: Family Medicine

## 2016-08-09 ENCOUNTER — Ambulatory Visit: Payer: BC Managed Care – PPO | Admitting: Family Medicine

## 2016-09-12 NOTE — Progress Notes (Signed)
HPI:  [name] is a pleasant 51 y.o. here for follow up. Chronic medical problems summarized below were reviewed for changes. Her hemoglobin A1c was elevated at 7.8 last check in March. We advised increasing her metformin and also strict adherence to a low sugar healthy diet and regular exercise. Reports she has been doing fairly well and has been improving her diet over the summer and getting more exercise. She does have a new concern of right shoulder pain. She reports she had an injury here about 10 years ago and was told she had mild rotator cuff issues. She had some pain with this about 3 weeks ago that now is much improved. Mainly has pain with abduction above 90. No weakness, radiation of pain, numbness, malaise or fevers. Denies CP, SOB, DOE, treatment intolerance or new symptoms. Due for labs: BMP, CBC, hemoglobin A1c, fasting lipid panel, colon cancer screening She had planned to check on Cologuard testing with her insurance company, but forgot to do so. She requests that we go ahead and order this today.  Hypertension: -Medications include: Hydrochlorothiazide  Diabetes: -Medications include: Metformin  Hyperlipidemia/obesity: -Medications include: Crestor  ROS: See pertinent positives and negatives per HPI.  Past Medical History:  Diagnosis Date  . Allergy   . Diabetes ([place])   . GERD (gastroesophageal reflux disease)   . Heart murmur 08/28/2015  . Hyperlipemia   . Hypertension   . Obesity   . Venous insufficiency     Past Surgical History:  Procedure Laterality Date  . Birth Elta [place] removed    . CHOLECYSTECTOMY    . INTRAUTERINE DEVICE INSERTION  12/31/2013   Mirena    Family History  Problem Relation Age of Onset  . Hypertension Father   . Diabetes Father   . Cancer Paternal Grandfather        Stomach  . Cancer Maternal Uncle        Pancreatic  . Cancer Paternal Uncle        Kidney    Social History   Social History  . Marital status:  Married    Spouse name: N/A  . Number of children: N/A  . Years of education: N/A   Social History Main Topics  . Smoking status: Never Smoker  . Smokeless tobacco: Never Used  . Alcohol use 0.0 oz/week     Comment: Rare  . Drug use: No  . Sexual activity: Yes    Birth control/ protection: IUD     Comment: Mirena 12/31/2013-1st intercourse 51 yo-Fewer than 5 partners   Other Topics Concern  . 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  Ascorbic Acid (VITAMIN C PO), Take by mouth., Disp: , Rfl:  .  Cholecalciferol (VITAMIN D3 PO), Take by mouth., Disp: , Rfl:  .  fluticasone (FLONASE) 50 MCG/ACT nasal spray, Place into both nostrils daily., Disp: , Rfl:  .  hydrochlorothiazide (HYDRODIURIL) 25 MG tablet, TAKE 1 TABLET (25 MG TOTAL) BY MOUTH DAILY., Disp: 90 tablet, Rfl: 1 .  levonorgestrel (MIRENA) 20 MCG/24HR IUD, 1 each by Intrauterine route once., Disp: , Rfl:  .  metFORMIN (GLUCOPHAGE) 1000 MG tablet, Take 1 tablet (1,000 mg total) by mouth 2 (two) times daily with a meal., Disp: 180 tablet, Rfl: 1 .  Probiotic Product (PROBIOTIC PO), Take by mouth daily. , Disp: , Rfl:  .  rosuvastatin (CRESTOR) 5 MG tablet, TAKE 1 TABLET (5 MG TOTAL) BY MOUTH DAILY., Disp: 90 tablet, Rfl: 3  Current Facility-Administered Medications:  .  levonorgestrel (MIRENA) 20 MCG/24HR IUD, , Intrauterine, Once, [name], Timothy P, MD  EXAM:  Vitals:   09/13/16 0911  BP: 122/80  Pulse: 79  Temp: 98.3 F (36.8 C)    Body mass index is 63.8 kg/m.  GENERAL: vitals reviewed and listed above, alert, oriented, appears well hydrated and in no acute distress  HEENT: atraumatic, conjunttiva clear, no obvious  abnormalities on inspection of external nose and ears  NECK: no obvious masses on inspection  LUNGS: clear to auscultation bilaterally, no wheezes, rales or rhonchi, good air movement  CV: HRRR, no peripheral edema  MS: moves all extremities without noticeable abnormality, normal inspection of the shoulders, upper back, upper extremities and head and neck. She does have some tenderness to palpation in the region of attachment of the rotator cuff to the humerus. Mildly positive impingement test on the right shoulder. Negative empty can, negative Neer's, negative shawl sign, neurovascularly intact distally in the upper extremities bilaterally  PSYCH: pleasant and cooperative, no obvious depression or anxiety  ASSESSMENT AND PLAN:  Discussed the following assessment and plan:  Type 2 diabetes mellitus without complication, without long-term current use of insulin (HCC) - Plan: Hemoglobin A1c  Hypertension associated with diabetes ([place]) - Plan: Basic metabolic panel, CBC  Hyperlipidemia associated with type 2 diabetes mellitus ([place]) - Plan: Lipid panel  Right rotator cuff tendonitis  Morbid obesity ([place])  -Labs today -Cologuard test - advised assistant to order -Advised healthy lifestyle and weight reduction -For the shoulder, do suspect mild rotator cuff tendinitis and a home exercise program and conservative symptomatic care were discussed. Advised to follow up in 3-4 weeks if symptoms are not resolved. Sooner if worsening. -Patient advised to return or notify a doctor immediately if symptoms worsen or persist or new concerns arise.  Patient Instructions  BEFORE YOU LEAVE: -Order Cologuard test -Rotator cuff exercises -Labs -follow up: 3-4 months  We ordered the Cologuard test for colon cancer screening. Please complete this test promptly once the kit arrives. Please contact us if you have not received your kit in the next few weeks.  For the shoulder pain: -Do the exercises  that we provided 3-4 days per week -Ice, Aleve per instructions, Tylenol (topical menthol) as needed for pain -Please follow up in 1 month if pain persists  We have ordered labs or studies at this visit. It can take up to 1-2 weeks for results and processing. IF results require follow up or explanation, we will call you with instructions. Clinically stable results will be released to your [facility]. If you have not heard from [place] or cannot find your results in [facility] in 2 weeks please contact our office at [phone].  If you are not yet signed up for [facility], please consider signing up.   We recommend the following healthy lifestyle for LIFE: 1) Small portions.   Tip: eat off of a salad plate instead of a dinner plate.  Tip: It is ok to feel hungry after a meal - that likely means you ate an appropriate portion.  Tip: if you need more or a snack choose  fruits, veggies and/or a handful of nuts or seeds.  2) Eat a healthy clean diet.   TRY TO EAT: -at least 5-7 servings of low sugar vegetables per day (not corn, potatoes or bananas.) -berries are the best choice if you wish to eat fruit.   -lean meets (fish, chicken or [place] breasts) -vegan proteins for some meals - beans or tofu, whole grains, nuts and seeds -Replace bad fats with good fats - good fats include: fish, nuts and seeds, canola oil, olive oil -small amounts of low fat or non fat dairy -small amounts of100 % whole grains - check the lables  AVOID: -SUGAR, sweets, anything with added sugar, corn syrup or sweeteners -if you must have a sweetener, small amounts of stevia may be best -sweetened beverages -simple starches (rice, bread, potatoes, pasta, chips, etc - small amounts of 100% whole grains are ok) -red meat, pork, butter -fried foods, fast food, processed food, excessive dairy, eggs and coconut.  3)Get at least 150 minutes of sweaty aerobic exercise per week.  4)Reduce stress - consider counseling, meditation and  relaxation to balance other aspects of your life.'  WE NOW OFFER    [facility] FAST TRACK!!!  SAME DAY Appointments for ACUTE CARE  Such as: Sprains, Injuries, cuts, abrasions, rashes, muscle pain, joint pain, back pain Colds, flu, sore throats, headache, allergies, cough, fever  Ear pain, sinus and eye infections Abdominal pain, nausea, vomiting, diarrhea, upset stomach Animal/insect bites  3 Easy Ways to Schedule: Walk-In Scheduling Call in scheduling Mychart Sign-up: [email or web address]                  Colin Benton R., DO

## 2016-09-13 ENCOUNTER — Ambulatory Visit (INDEPENDENT_AMBULATORY_CARE_PROVIDER_SITE_OTHER): Payer: BC Managed Care – PPO | Admitting: Family Medicine

## 2016-09-13 ENCOUNTER — Encounter: Payer: Self-pay | Admitting: Family Medicine

## 2016-09-13 VITALS — BP 122/80 | HR 79 | Temp 98.3°F | Ht 65.0 in | Wt 383.4 lb

## 2016-09-13 DIAGNOSIS — E1159 Type 2 diabetes mellitus with other circulatory complications: Secondary | ICD-10-CM | POA: Diagnosis not present

## 2016-09-13 DIAGNOSIS — E1169 Type 2 diabetes mellitus with other specified complication: Secondary | ICD-10-CM | POA: Diagnosis not present

## 2016-09-13 DIAGNOSIS — E785 Hyperlipidemia, unspecified: Secondary | ICD-10-CM

## 2016-09-13 DIAGNOSIS — I10 Essential (primary) hypertension: Secondary | ICD-10-CM

## 2016-09-13 DIAGNOSIS — I152 Hypertension secondary to endocrine disorders: Secondary | ICD-10-CM

## 2016-09-13 DIAGNOSIS — M7581 Other shoulder lesions, right shoulder: Secondary | ICD-10-CM

## 2016-09-13 DIAGNOSIS — E119 Type 2 diabetes mellitus without complications: Secondary | ICD-10-CM

## 2016-09-13 LAB — HEMOGLOBIN A1C: HEMOGLOBIN A1C: 7.4 % — AB (ref 4.6–6.5)

## 2016-09-13 LAB — LIPID PANEL
CHOL/HDL RATIO: 3
Cholesterol: 133 mg/dL (ref 0–200)
HDL: 40.3 mg/dL (ref >39.00)
LDL CALC: 70 mg/dL (ref 0–99)
NONHDL: 92.22
Triglycerides: 109 mg/dL (ref 0.0–149.0)
VLDL: 21.8 mg/dL (ref 0.0–40.0)

## 2016-09-13 LAB — CBC
HCT: 46.6 % — ABNORMAL HIGH (ref 36.0–46.0)
HEMOGLOBIN: 14.7 g/dL (ref 12.0–15.0)
MCHC: 31.6 g/dL (ref 30.0–36.0)
MCV: 88.9 fl (ref 78.0–100.0)
Platelets: 252 10*3/uL (ref 150.0–400.0)
RBC: 5.24 Mil/uL — AB (ref 3.87–5.11)
RDW: 17.4 % — ABNORMAL HIGH (ref 11.5–15.5)
WBC: 6.5 10*3/uL (ref 4.0–10.5)

## 2016-09-13 LAB — BASIC METABOLIC PANEL
BUN: 11 mg/dL (ref 6–23)
CHLORIDE: 98 meq/L (ref 96–112)
CO2: 35 meq/L — AB (ref 19–32)
CREATININE: 0.59 mg/dL (ref 0.40–1.20)
Calcium: 9 mg/dL (ref 8.4–10.5)
GFR: 114.18 mL/min (ref >60.00)
Glucose, Bld: 114 mg/dL — ABNORMAL HIGH (ref 70–99)
POTASSIUM: 3.8 meq/L (ref 3.5–5.1)
Sodium: 139 mEq/L (ref 135–145)

## 2016-09-13 NOTE — Patient Instructions (Signed)
BEFORE YOU LEAVE: -Order Cologuard test -Rotator cuff exercises -Labs -follow up: 3-4 months  We ordered the Cologuard test for colon cancer screening. Please complete this test promptly once the kit arrives. Please contact us if you have not received your kit in the next few weeks.  For the shoulder pain: -Do the exercises that we provided 3-4 days per week -Ice, Aleve per instructions, Tylenol (topical menthol) as needed for pain -Please follow up in 1 month if pain persists  We have ordered labs or studies at this visit. It can take up to 1-2 weeks for results and processing. IF results require follow up or explanation, we will call you with instructions. Clinically stable results will be released to your [facility]. If you have not heard from [place] or cannot find your results in [facility] in 2 weeks please contact our office at [phone].  If you are not yet signed up for [facility], please consider signing up.   We recommend the following healthy lifestyle for LIFE: 1) Small portions.   Tip: eat off of a salad plate instead of a dinner plate.  Tip: It is ok to feel hungry after a meal - that likely means you ate an appropriate portion.  Tip: if you need more or a snack choose fruits, veggies and/or a handful of nuts or seeds.  2) Eat a healthy clean diet.   TRY TO EAT: -at least 5-7 servings of low sugar vegetables per day (not corn, potatoes or bananas.) -berries are the best choice if you wish to eat fruit.   -lean meets (fish, chicken or [place] breasts) -vegan proteins for some meals - beans or tofu, whole grains, nuts and seeds -Replace bad fats with good fats - good fats include: fish, nuts and seeds, canola oil, olive oil -small amounts of low fat or non fat dairy -small amounts of100 % whole grains - check the lables  AVOID: -SUGAR, sweets, anything with added sugar, corn syrup or sweeteners -if you must have a sweetener, small amounts of stevia may be best -sweetened  beverages -simple starches (rice, bread, potatoes, pasta, chips, etc - small amounts of 100% whole grains are ok) -red meat, pork, butter -fried foods, fast food, processed food, excessive dairy, eggs and coconut.  3)Get at least 150 minutes of sweaty aerobic exercise per week.  4)Reduce stress - consider counseling, meditation and relaxation to balance other aspects of your life.'  WE NOW OFFER   [place] [facility] FAST TRACK!!!  SAME DAY Appointments for ACUTE CARE  Such as: Sprains, Injuries, cuts, abrasions, rashes, muscle pain, joint pain, back pain Colds, flu, sore throats, headache, allergies, cough, fever  Ear pain, sinus and eye infections Abdominal pain, nausea, vomiting, diarrhea, upset stomach Animal/insect bites  3 Easy Ways to Schedule: Walk-In Scheduling Call in scheduling Mychart Sign-up: [email or web address]

## 2016-09-19 ENCOUNTER — Telehealth: Payer: Self-pay | Admitting: Family Medicine

## 2016-09-19 NOTE — Telephone Encounter (Signed)
I called the pt and she stated she received the Cologard test with instructions not to use this if you have any active bleeding.  She stated she has had hemorrhoids intermittently since the birth of her last child, which is getting better now with only spotting and questioned if she should do the Cologard testing now or can she wait a few weeks.  I advised the pt she should wait until the bleeding is completely gone and she can wait a few weeks to return the test.  Message sent to Dr Selena BattenKim as Lorain ChildesFYI.

## 2016-09-19 NOTE — Telephone Encounter (Signed)
Pt state that she received her Cologuard yesterday and have some question concerning it.

## 2016-09-19 NOTE — Telephone Encounter (Signed)
Agree 

## 2016-10-02 LAB — COLOGUARD: Cologuard: NEGATIVE

## 2016-11-17 ENCOUNTER — Other Ambulatory Visit: Payer: Self-pay | Admitting: Family Medicine

## 2016-12-06 ENCOUNTER — Other Ambulatory Visit: Payer: Self-pay | Admitting: Family Medicine

## 2016-12-13 ENCOUNTER — Ambulatory Visit: Payer: BC Managed Care – PPO | Admitting: Family Medicine

## 2017-01-13 ENCOUNTER — Other Ambulatory Visit: Payer: Self-pay | Admitting: Family Medicine

## 2017-01-20 ENCOUNTER — Ambulatory Visit: Payer: BC Managed Care – PPO | Admitting: Family Medicine

## 2017-03-29 LAB — HM DIABETES EYE EXAM

## 2017-04-03 ENCOUNTER — Inpatient Hospital Stay (HOSPITAL_COMMUNITY)
Admission: EM | Admit: 2017-04-03 | Discharge: 2017-04-05 | DRG: 291 | Disposition: A | Discharge status: Home / Self Care | Payer: BC Managed Care – PPO | Attending: Internal Medicine | Admitting: Internal Medicine

## 2017-04-03 ENCOUNTER — Emergency Department (HOSPITAL_COMMUNITY): Payer: BC Managed Care – PPO

## 2017-04-03 ENCOUNTER — Ambulatory Visit: Payer: BC Managed Care – PPO | Admitting: Adult Health

## 2017-04-03 ENCOUNTER — Encounter: Payer: Self-pay | Admitting: Adult Health

## 2017-04-03 ENCOUNTER — Other Ambulatory Visit: Payer: Self-pay

## 2017-04-03 ENCOUNTER — Encounter (HOSPITAL_COMMUNITY): Payer: Self-pay | Admitting: Emergency Medicine

## 2017-04-03 VITALS — BP 156/100 | Temp 98.5°F | Wt >= 6400 oz

## 2017-04-03 DIAGNOSIS — Z79899 Other long term (current) drug therapy: Secondary | ICD-10-CM | POA: Diagnosis not present

## 2017-04-03 DIAGNOSIS — Z7984 Long term (current) use of oral hypoglycemic drugs: Secondary | ICD-10-CM

## 2017-04-03 DIAGNOSIS — Z975 Presence of (intrauterine) contraceptive device: Secondary | ICD-10-CM | POA: Diagnosis not present

## 2017-04-03 DIAGNOSIS — R0602 Shortness of breath: Secondary | ICD-10-CM

## 2017-04-03 DIAGNOSIS — E785 Hyperlipidemia, unspecified: Secondary | ICD-10-CM | POA: Diagnosis present

## 2017-04-03 DIAGNOSIS — I872 Venous insufficiency (chronic) (peripheral): Secondary | ICD-10-CM | POA: Diagnosis present

## 2017-04-03 DIAGNOSIS — I509 Heart failure, unspecified: Secondary | ICD-10-CM

## 2017-04-03 DIAGNOSIS — I10 Essential (primary) hypertension: Secondary | ICD-10-CM | POA: Diagnosis not present

## 2017-04-03 DIAGNOSIS — I110 Hypertensive heart disease with heart failure: Secondary | ICD-10-CM | POA: Diagnosis present

## 2017-04-03 DIAGNOSIS — J441 Chronic obstructive pulmonary disease with (acute) exacerbation: Secondary | ICD-10-CM | POA: Diagnosis present

## 2017-04-03 DIAGNOSIS — J810 Acute pulmonary edema: Secondary | ICD-10-CM | POA: Diagnosis not present

## 2017-04-03 DIAGNOSIS — I5033 Acute on chronic diastolic (congestive) heart failure: Secondary | ICD-10-CM | POA: Diagnosis not present

## 2017-04-03 DIAGNOSIS — K219 Gastro-esophageal reflux disease without esophagitis: Secondary | ICD-10-CM | POA: Diagnosis present

## 2017-04-03 DIAGNOSIS — Z8249 Family history of ischemic heart disease and other diseases of the circulatory system: Secondary | ICD-10-CM | POA: Diagnosis not present

## 2017-04-03 DIAGNOSIS — Z7951 Long term (current) use of inhaled steroids: Secondary | ICD-10-CM

## 2017-04-03 DIAGNOSIS — I272 Pulmonary hypertension, unspecified: Secondary | ICD-10-CM | POA: Diagnosis present

## 2017-04-03 DIAGNOSIS — I5031 Acute diastolic (congestive) heart failure: Secondary | ICD-10-CM | POA: Diagnosis present

## 2017-04-03 DIAGNOSIS — Z30431 Encounter for routine checking of intrauterine contraceptive device: Secondary | ICD-10-CM

## 2017-04-03 DIAGNOSIS — I2781 Cor pulmonale (chronic): Secondary | ICD-10-CM | POA: Diagnosis not present

## 2017-04-03 DIAGNOSIS — E119 Type 2 diabetes mellitus without complications: Secondary | ICD-10-CM | POA: Diagnosis present

## 2017-04-03 DIAGNOSIS — Z6841 Body Mass Index (BMI) 40.0 and over, adult: Secondary | ICD-10-CM | POA: Diagnosis not present

## 2017-04-03 DIAGNOSIS — J018 Other acute sinusitis: Secondary | ICD-10-CM | POA: Diagnosis not present

## 2017-04-03 DIAGNOSIS — I313 Pericardial effusion (noninflammatory): Secondary | ICD-10-CM | POA: Diagnosis present

## 2017-04-03 DIAGNOSIS — Z955 Presence of coronary angioplasty implant and graft: Secondary | ICD-10-CM | POA: Diagnosis not present

## 2017-04-03 DIAGNOSIS — J9601 Acute respiratory failure with hypoxia: Secondary | ICD-10-CM | POA: Diagnosis present

## 2017-04-03 DIAGNOSIS — R0600 Dyspnea, unspecified: Secondary | ICD-10-CM

## 2017-04-03 DIAGNOSIS — E662 Morbid (severe) obesity with alveolar hypoventilation: Secondary | ICD-10-CM | POA: Diagnosis present

## 2017-04-03 LAB — I-STAT CHEM 8, ED
BUN: 14 mg/dL (ref 6–20)
CALCIUM ION: 1.08 mmol/L — AB (ref 1.15–1.40)
Chloride: 93 mmol/L — ABNORMAL LOW (ref 101–111)
Creatinine, Ser: 0.6 mg/dL (ref 0.44–1.00)
Glucose, Bld: 133 mg/dL — ABNORMAL HIGH (ref 65–99)
HEMATOCRIT: 48 % — AB (ref 36.0–46.0)
Hemoglobin: 16.3 g/dL — ABNORMAL HIGH (ref 12.0–15.0)
Potassium: 3.7 mmol/L (ref 3.5–5.1)
SODIUM: 138 mmol/L (ref 135–145)
TCO2: 37 mmol/L — AB (ref 22–32)

## 2017-04-03 LAB — COMPREHENSIVE METABOLIC PANEL
ALBUMIN: 3.4 g/dL — AB (ref 3.5–5.0)
ALK PHOS: 97 U/L (ref 38–126)
ALT: 43 U/L (ref 14–54)
AST: 36 U/L (ref 15–41)
Anion gap: 13 (ref 5–15)
BILIRUBIN TOTAL: 0.9 mg/dL (ref 0.3–1.2)
BUN: 14 mg/dL (ref 6–20)
CO2: 30 mmol/L (ref 22–32)
CREATININE: 0.56 mg/dL (ref 0.44–1.00)
Calcium: 8.7 mg/dL — ABNORMAL LOW (ref 8.9–10.3)
Chloride: 95 mmol/L — ABNORMAL LOW (ref 101–111)
GFR calc Af Amer: >60 mL/min (ref >60)
GLUCOSE: 133 mg/dL — AB (ref 65–99)
Potassium: 3.7 mmol/L (ref 3.5–5.1)
Sodium: 138 mmol/L (ref 135–145)
TOTAL PROTEIN: 6.8 g/dL (ref 6.5–8.1)

## 2017-04-03 LAB — CBC WITH DIFFERENTIAL/PLATELET
BASOS ABS: 0 10*3/uL (ref 0.0–0.1)
Basophils Relative: 0 %
EOS ABS: 0.1 10*3/uL (ref 0.0–0.7)
EOS PCT: 1 %
HCT: 47.4 % — ABNORMAL HIGH (ref 36.0–46.0)
Hemoglobin: 14.3 g/dL (ref 12.0–15.0)
Lymphocytes Relative: 15 %
Lymphs Abs: 1.4 10*3/uL (ref 0.7–4.0)
MCH: 27.4 pg (ref 26.0–34.0)
MCHC: 30.2 g/dL (ref 30.0–36.0)
MCV: 91 fL (ref 78.0–100.0)
Monocytes Absolute: 0.5 10*3/uL (ref 0.1–1.0)
Monocytes Relative: 6 %
Neutro Abs: 7.2 10*3/uL (ref 1.7–7.7)
Neutrophils Relative %: 78 %
PLATELETS: 259 10*3/uL (ref 150–400)
RBC: 5.21 MIL/uL — AB (ref 3.87–5.11)
RDW: 18.3 % — AB (ref 11.5–15.5)
WBC: 9.2 10*3/uL (ref 4.0–10.5)

## 2017-04-03 LAB — URINALYSIS, ROUTINE W REFLEX MICROSCOPIC
BILIRUBIN URINE: NEGATIVE
Bacteria, UA: NONE SEEN
Glucose, UA: NEGATIVE mg/dL
Hgb urine dipstick: NEGATIVE
Ketones, ur: NEGATIVE mg/dL
Leukocytes, UA: NEGATIVE
Nitrite: NEGATIVE
PH: 7 (ref 5.0–8.0)
Protein, ur: 30 mg/dL — AB
SPECIFIC GRAVITY, URINE: 1.024 (ref 1.005–1.030)

## 2017-04-03 LAB — INFLUENZA PANEL BY PCR (TYPE A & B)
INFLBPCR: NEGATIVE
Influenza A By PCR: NEGATIVE

## 2017-04-03 LAB — I-STAT TROPONIN, ED: TROPONIN I, POC: 0.01 ng/mL (ref 0.00–0.08)

## 2017-04-03 LAB — I-STAT CG4 LACTIC ACID, ED
Lactic Acid, Venous: 2.31 mmol/L (ref 0.5–1.9)
Lactic Acid, Venous: 1.57 mmol/L (ref 0.5–1.9)

## 2017-04-03 LAB — I-STAT BETA HCG BLOOD, ED (MC, WL, AP ONLY)

## 2017-04-03 LAB — HEMOGLOBIN A1C
HEMOGLOBIN A1C: 7.7 % — AB (ref 4.8–5.6)
MEAN PLASMA GLUCOSE: 174.29 mg/dL

## 2017-04-03 LAB — BRAIN NATRIURETIC PEPTIDE: B NATRIURETIC PEPTIDE 5: 90 pg/mL (ref 0.0–100.0)

## 2017-04-03 LAB — GLUCOSE, CAPILLARY
GLUCOSE-CAPILLARY: 114 mg/dL — AB (ref 65–99)
GLUCOSE-CAPILLARY: 162 mg/dL — AB (ref 65–99)

## 2017-04-03 MED ORDER — IOPAMIDOL (ISOVUE-370) INJECTION 76%
INTRAVENOUS | Status: AC
  Filled 2017-04-03: qty 100

## 2017-04-03 MED ORDER — ACETAMINOPHEN 500 MG PO TABS: 1000.0000 mg | ORAL_TABLET | Freq: Every day | ORAL | Status: DC | PRN

## 2017-04-03 MED ORDER — IBUPROFEN 200 MG PO TABS: 400.0000 mg | ORAL_TABLET | Freq: Every day | ORAL | Status: DC | PRN

## 2017-04-03 MED ORDER — SODIUM CHLORIDE 0.9% FLUSH: 3.0000 mL | INTRAVENOUS | Status: DC | PRN

## 2017-04-03 MED ORDER — INSULIN ASPART 100 UNIT/ML ~~LOC~~ SOLN
0.0000 [IU] | Freq: Three times a day (TID) | SUBCUTANEOUS | Status: DC
  Administered 2017-04-04: 1 [IU] via SUBCUTANEOUS
  Administered 2017-04-04: 2 [IU] via SUBCUTANEOUS
  Administered 2017-04-05: 1 [IU] via SUBCUTANEOUS

## 2017-04-03 MED ORDER — HYDROCHLOROTHIAZIDE 25 MG PO TABS
25.0000 mg | ORAL_TABLET | Freq: Every day | ORAL | Status: DC
  Administered 2017-04-04 – 2017-04-05 (×2): 25 mg via ORAL
  Filled 2017-04-03 (×2): qty 1

## 2017-04-03 MED ORDER — ROSUVASTATIN CALCIUM 5 MG PO TABS
5.0000 mg | ORAL_TABLET | Freq: Every day | ORAL | Status: DC
  Administered 2017-04-04 – 2017-04-05 (×2): 5 mg via ORAL
  Filled 2017-04-03 (×2): qty 1

## 2017-04-03 MED ORDER — FLUTICASONE PROPIONATE 50 MCG/ACT NA SUSP
1.0000 | Freq: Two times a day (BID) | NASAL | Status: DC
  Administered 2017-04-03 – 2017-04-05 (×4): 1 via NASAL
  Filled 2017-04-03: qty 16

## 2017-04-03 MED ORDER — FUROSEMIDE 10 MG/ML IJ SOLN
40.0000 mg | Freq: Two times a day (BID) | INTRAMUSCULAR | Status: DC
  Administered 2017-04-04 – 2017-04-05 (×3): 40 mg via INTRAVENOUS
  Filled 2017-04-03 (×3): qty 4

## 2017-04-03 MED ORDER — FUROSEMIDE 10 MG/ML IJ SOLN
40.0000 mg | Freq: Once | INTRAMUSCULAR | Status: AC
  Administered 2017-04-03: 40 mg via INTRAVENOUS
  Filled 2017-04-03: qty 4

## 2017-04-03 MED ORDER — SODIUM CHLORIDE 0.9% FLUSH
3.0000 mL | Freq: Two times a day (BID) | INTRAVENOUS | Status: DC
  Administered 2017-04-03 – 2017-04-05 (×4): 3 mL via INTRAVENOUS

## 2017-04-03 MED ORDER — ASPIRIN EC 81 MG PO TBEC
81.0000 mg | DELAYED_RELEASE_TABLET | Freq: Every day | ORAL | Status: DC
  Administered 2017-04-03 – 2017-04-05 (×3): 81 mg via ORAL
  Filled 2017-04-03 (×3): qty 1

## 2017-04-03 MED ORDER — OSELTAMIVIR PHOSPHATE 75 MG PO CAPS
75.0000 mg | ORAL_CAPSULE | Freq: Two times a day (BID) | ORAL | Status: DC
  Filled 2017-04-03: qty 1

## 2017-04-03 MED ORDER — ALBUTEROL SULFATE (2.5 MG/3ML) 0.083% IN NEBU
5.0000 mg | INHALATION_SOLUTION | Freq: Once | RESPIRATORY_TRACT | Status: AC
  Administered 2017-04-03: 5 mg via RESPIRATORY_TRACT
  Filled 2017-04-03: qty 6

## 2017-04-03 MED ORDER — ACETAMINOPHEN 325 MG PO TABS
650.0000 mg | ORAL_TABLET | ORAL | Status: DC | PRN
  Administered 2017-04-04: 650 mg via ORAL
  Filled 2017-04-03: qty 2

## 2017-04-03 MED ORDER — FUROSEMIDE 10 MG/ML IJ SOLN: 40.0000 mg | Freq: Once | INTRAMUSCULAR | Status: DC

## 2017-04-03 MED ORDER — SODIUM CHLORIDE 0.9 % IV SOLN: 250.0000 mL | INTRAVENOUS | Status: DC | PRN

## 2017-04-03 MED ORDER — FUROSEMIDE 10 MG/ML IJ SOLN: 40.0000 mg | Freq: Two times a day (BID) | INTRAMUSCULAR | Status: DC

## 2017-04-03 MED ORDER — DM-GUAIFENESIN ER 30-600 MG PO TB12: 1.0000 | ORAL_TABLET | Freq: Every evening | ORAL | Status: DC | PRN

## 2017-04-03 MED ORDER — ONDANSETRON HCL 4 MG/2ML IJ SOLN: 4.0000 mg | Freq: Four times a day (QID) | INTRAMUSCULAR | Status: DC | PRN

## 2017-04-03 MED ORDER — IPRATROPIUM BROMIDE 0.02 % IN SOLN
0.5000 mg | Freq: Once | RESPIRATORY_TRACT | Status: AC
  Administered 2017-04-03: 0.5 mg via RESPIRATORY_TRACT
  Filled 2017-04-03: qty 2.5

## 2017-04-03 MED ORDER — IOPAMIDOL (ISOVUE-370) INJECTION 76%
100.0000 mL | Freq: Once | INTRAVENOUS | Status: AC | PRN
  Administered 2017-04-03: 100 mL via INTRAVENOUS

## 2017-04-03 NOTE — ED Notes (Signed)
Patient transported to CT 

## 2017-04-03 NOTE — H&P (Signed)
History and Physical    [name] ZOX:[id number] DOB: 25-Mar-1965 DOA: 04/03/2017  PCP: Terressa Koyanagi, DO  Patient coming from: Home  Chief Complaint: Shortness of breath and cough  HPI: [name] is a 52 y.o. female with medical history significant of morbid obesity, hypertension, diabetes comes in with over a week of upper respiratory infection including cough nasal congestion who is also noted worsening shortness of breath and swelling in her legs.  She has no prior history of congestive heart failure.  She denies any fevers.  She has not had any nausea vomiting or diarrhea.  She denies any orthopnea or PND.  She denies any chest pain.  Patient noted to be hypoxic on room air with O2 sats in the low 80s.  She does not have supplemental oxygen needs at home.  Patient found to be in new onset congestive heart failure referred for admission for such.  Review of Systems: As per HPI otherwise 10 point review of systems negative.   Past Medical History:  Diagnosis Date  . Allergy   . Diabetes (HCC)   . GERD (gastroesophageal reflux disease)   . Heart murmur 08/28/2015  . Hyperlipemia   . Hypertension   . Obesity   . Venous insufficiency     Past Surgical History:  Procedure Laterality Date  . Birth Loraine Leriche removed    . CHOLECYSTECTOMY    . INTRAUTERINE DEVICE INSERTION  12/31/2013   Mirena     reports that  has never smoked. she has never used smokeless tobacco. She reports that she drinks alcohol. She reports that she does not use drugs.  No Known Allergies  Family History  Problem Relation Age of Onset  . Hypertension Father   . Diabetes Father   . Cancer Paternal Grandfather        Stomach  . Cancer Maternal Uncle        Pancreatic  . Cancer Paternal Uncle        Kidney    Prior to Admission medications   Medication Sig Start Date End Date Taking? Authorizing Provider  acetaminophen (TYLENOL) 500 MG tablet Take 1,000 mg by mouth daily as needed  (PAIN).   Yes [provider]  Ascorbic Acid (VITAMIN C PO) Take by mouth.   Yes [provider]  Chlorphen-Phenyleph-ASA (ALKA-SELTZER PLUS COLD PO) Take 2 tablets by mouth daily as needed (HEAD CONGESTION, COUGH).   Yes [provider]  Cholecalciferol (VITAMIN D3 PO) Take by mouth.   Yes [provider]  dextromethorphan-guaiFENesin (MUCINEX DM) 30-600 MG 12hr tablet Take 1 tablet by mouth at bedtime as needed for cough.   Yes [provider]  fluticasone (FLONASE) 50 MCG/ACT nasal spray Place 1 spray into both nostrils 2 (two) times daily.    Yes [provider]  hydrochlorothiazide (HYDRODIURIL) 25 MG tablet TAKE 1 TABLET (25 MG TOTAL) BY MOUTH DAILY. 01/13/17  Yes Kriste Basque R, DO  ibuprofen (ADVIL,MOTRIN) 200 MG tablet Take 400-600 mg by mouth daily as needed (pain).   Yes [provider]  levonorgestrel (MIRENA) 20 MCG/24HR IUD 1 each by Intrauterine route once.   Yes [provider]  metFORMIN (GLUCOPHAGE) 1000 MG tablet TAKE 1 TABLET (1,000 MG TOTAL) BY MOUTH 2 (TWO) TIMES DAILY WITH A MEAL. 12/06/16  Yes Terressa Koyanagi, DO  Multiple Vitamins-Minerals (WOMENS DAILY FORMULA PO) Take 1 tablet by mouth daily.   Yes [provider]  naproxen sodium (ALEVE) 220 MG tablet Take  220 mg by mouth at bedtime as needed (KNEE PAIN).   Yes [provider]  rosuvastatin (CRESTOR) 5 MG tablet TAKE 1 TABLET (5 MG TOTAL) BY MOUTH DAILY. 11/18/16  Yes Terressa Koyanagi, DO    Physical Exam: Vitals:   04/03/17 1308 04/03/17 1400 04/03/17 1500 04/03/17 1530  BP:  (!) 144/89 139/69 135/78  Pulse:  67 65 60  Resp:  17    Temp:      TempSrc:      SpO2: 100% 94% 96% 99%  Weight:      Height:          Constitutional: NAD, calm, comfortable and morbidly obese Vitals:   04/03/17 1308 04/03/17 1400 04/03/17 1500 04/03/17 1530  BP:  (!) 144/89 139/69 135/78  Pulse:  67 65 60  Resp:  17    Temp:      TempSrc:        SpO2: 100% 94% 96% 99%  Weight:      Height:       Eyes: PERRL, lids and conjunctivae normal ENMT: Mucous membranes are moist. Posterior pharynx clear of any exudate or lesions.Normal dentition.  Neck: normal, supple, no masses, no thyromegaly Respiratory: clear to auscultation bilaterally, no wheezing, no crackles. Normal respiratory effort. No accessory muscle use.  Cardiovascular: Regular rate and rhythm, no murmurs / rubs / gallops.  1+ extremity edema. 2+ pedal pulses. No carotid bruits.  Abdomen: no tenderness, no masses palpated. No hepatosplenomegaly. Bowel sounds positive.  Musculoskeletal: no clubbing / cyanosis. No joint deformity upper and lower extremities. Good ROM, no contractures. Normal muscle tone.  Skin: no rashes, lesions, ulcers. No induration Neurologic: CN 2-12 grossly intact. Sensation intact, DTR normal. Strength 5/5 in all 4.  Psychiatric: Normal judgment and insight. Alert and oriented x 3. Normal mood.    Labs on Admission: I have personally reviewed following labs and imaging studies  CBC: Recent Labs  Lab 04/03/17 1331 04/03/17 1342  WBC 9.2  --   NEUTROABS 7.2  --   HGB 14.3 16.3*  HCT 47.4* 48.0*  MCV 91.0  --   PLT 259  --    Basic Metabolic Panel: Recent Labs  Lab 04/03/17 1331 04/03/17 1342  NA 138 138  K 3.7 3.7  CL 95* 93*  CO2 30  --   GLUCOSE 133* 133*  BUN 14 14  CREATININE 0.56 0.60  CALCIUM 8.7*  --    GFR: Estimated Creatinine Clearance: 144.9 mL/min (by C-G formula based on SCr of 0.6 mg/dL). Liver Function Tests: Recent Labs  Lab 04/03/17 1331  AST 36  ALT 43  ALKPHOS 97  BILITOT 0.9  PROT 6.8  ALBUMIN 3.4*   No results for input(s): LIPASE, AMYLASE in the last 168 hours. No results for input(s): AMMONIA in the last 168 hours. Coagulation Profile: No results for input(s): INR, PROTIME in the last 168 hours. Cardiac Enzymes: No results for input(s): CKTOTAL, CKMB, CKMBINDEX, TROPONINI in the last 168  hours. BNP (last 3 results) No results for input(s): PROBNP in the last 8760 hours. HbA1C: No results for input(s): HGBA1C in the last 72 hours. CBG: No results for input(s): GLUCAP in the last 168 hours. Lipid Profile: No results for input(s): CHOL, HDL, LDLCALC, TRIG, CHOLHDL, LDLDIRECT in the last 72 hours. Thyroid Function Tests: No results for input(s): TSH, T4TOTAL, FREET4, T3FREE, THYROIDAB in the last 72 hours. Anemia Panel: No results for input(s): VITAMINB12, FOLATE, FERRITIN, TIBC, IRON, RETICCTPCT in the last 72  hours. Urine analysis:    Component Value Date/Time   COLORURINE YELLOW 12/15/2013 1115   APPEARANCEUR CLEAR 12/15/2013 1115   LABSPEC 1.015 12/15/2013 1115   PHURINE 7.5 12/15/2013 1115   GLUCOSEU NEG 12/15/2013 1115   HGBUR NEG 12/15/2013 1115   BILIRUBINUR NEG 12/15/2013 1115   KETONESUR NEG 12/15/2013 1115   PROTEINUR NEG 12/15/2013 1115   UROBILINOGEN 0.2 12/15/2013 1115   NITRITE NEG 12/15/2013 1115   LEUKOCYTESUR NEG 12/15/2013 1115   Sepsis Labs: !!!!!!!!!!!!!!!!!!!!!!!!!!!!!!!!!!!!!!!!!!!! @LABRCNTIP (procalcitonin:4,lacticidven:4) )No results found for this or any previous visit (from the past 240 hour(s)).   Radiological Exams on Admission: Dg Chest 2 View  Result Date: 04/03/2017 CLINICAL DATA:  Congestion, shortness of breath, coughing EXAM: CHEST  2 VIEW COMPARISON:  None FINDINGS: Diffuse bilateral interstitial thickening. No pleural effusion or pneumothorax. Stable cardiomegaly. No acute osseous abnormality. IMPRESSION: Findings concerning for mild CHF. Electronically Signed   By: Elige KoHetal  Patel   On: 04/03/2017 14:22   Ct Angio Chest Pe W And/or Wo Contrast  Result Date: 04/03/2017 CLINICAL DATA:  Shortness of breath and wheezing EXAM: CT ANGIOGRAPHY CHEST WITH CONTRAST TECHNIQUE: Multidetector CT imaging of the chest was performed using the standard protocol during bolus administration of intravenous contrast. Multiplanar CT image  reconstructions and MIPs were obtained to evaluate the vascular anatomy. CONTRAST:  100mL ISOVUE-370 IOPAMIDOL (ISOVUE-370) INJECTION 76% COMPARISON:  Chest radiograph April 03, 2017 FINDINGS: Cardiovascular: There is no evident pulmonary embolus. There is no thoracic aortic aneurysm or dissection. The visualized great vessels appear unremarkable. There is a small pericardial effusion. There is enlargement of the main pulmonary outflow tract measuring 3.9 cm in diameter consistent with pulmonary arterial hypertension. Mediastinum/Nodes: Thyroid appears unremarkable. There is no appreciable thoracic adenopathy. No esophageal lesions are evident. Lungs/Pleura: There is patchy atelectatic change throughout both lower lobes. There is milder atelectatic change in the left upper lobe. There is mild interstitial edema. There is no airspace consolidation. There is no appreciable pleural effusion or pleural thickening. Upper Abdomen: There is reflux of contrast into the inferior vena cava and hepatic veins. Visualized upper abdominal structures otherwise appear unremarkable. Musculoskeletal: There is degenerative change in the thoracic spine. There are no blastic or lytic bone lesions. No Review of the MIP images confirms the above findings. IMPRESSION: 1. No demonstrable pulmonary embolus. No thoracic aortic aneurysm or dissection. 2. Mild interstitial pulmonary edema. Scattered areas of atelectatic change. No airspace consolidation. No appreciable pleural effusion. 3.  Small pericardial effusion. 4.  No demonstrable thoracic adenopathy. 5. Reflux of contrast into the inferior vena cava and hepatic veins, a finding that may be indicating a degree of increase in right heart pressure. Electronically Signed   By: Bretta BangWilliam  Woodruff III M.D.   On: 04/03/2017 14:42    EKG: Independently reviewed.  Normal sinus rhythm without any acute changes Old chart reviewed Case discussed with EDP Chest x-ray personally reviewed with  mild edema   Assessment/Plan 52 year old female with new onset congestive heart failure of unclear etiology Principal Problem:   CHF (congestive heart failure) (HCC)-could be cardiomyopathy from post viral syndrome versus diastolic congestive heart failure.  Placed on Lasix 40 mg IV every 12 hours.  Placed on CHF pathway.  Obtain cardiac echo in the morning.  Her influenza is negative.  Daily weights and accurate I's and O's.  Supplemental oxygen as needed.  Her O2 sats are normal on 2 L currently.  Active Problems:   Morbid obesity (HCC)-noted    Chronic venous insufficiency-noted  Diabetes (HCC)-holding metformin and place on sliding scale insulin.  Check hemoglobin A1c.    Acute respiratory failure with hypoxia (HCC)-due to the above new onset congestive heart failure.  As above.    DVT prophylaxis: SCDs Code Status: Full Family Communication: Husband Disposition Plan: Per day team Consults called: None Admission status: Admission   Mima Cranmore A MD [facility]  If 7PM-7AM, please contact night-coverage [email or web address] Password [facility]  04/03/2017, 4:28 PM

## 2017-04-03 NOTE — ED Notes (Signed)
ED TO INPATIENT HANDOFF REPORT  Name/Age/Gender [name] 52 y.o. female  Code Status Code Status History    This patient does not have a recorded code status. Please follow your organizational policy for patients in this situation.      Home/SNF/Other Home  Chief Complaint wound check   Level of Care/Admitting Diagnosis ED Disposition    ED Disposition Condition [facility] Area: [place] [[id number]]  Level of Care: Telemetry [5]  Admit to tele based on following criteria: Acute CHF  Diagnosis: CHF (congestive heart failure) [facility]) [409811]  Admitting Physician: Phillips Grout [[id number]]  Attending Physician: Derrill Kay A [[id number]]  Estimated length of stay: past midnight tomorrow  Certification:: I certify this patient will need inpatient services for at least 2 midnights  PT Class (Do Not Modify): Inpatient [101]  PT Acc Code (Do Not Modify): Private [1]       Medical History Past Medical History:  Diagnosis Date  . Allergy   . Diabetes ([place])   . GERD (gastroesophageal reflux disease)   . Heart murmur 08/28/2015  . Hyperlipemia   . Hypertension   . Obesity   . Venous insufficiency     Allergies No Known Allergies  IV Location/Drains/Wounds Patient Lines/Drains/Airways Status   Active Line/Drains/Airways    Name:   Placement date:   Placement time:   Site:   Days:   Peripheral IV 04/03/17 Right Hand   04/03/17    1336    Hand   less than 1   Peripheral IV 04/03/17 Left Antecubital   04/03/17    1403    Antecubital   less than 1          Labs/Imaging Results for orders placed or performed during the hospital encounter of 04/03/17 (from the past 48 hour(s))  Comprehensive metabolic panel     Status: Abnormal   Collection Time: 04/03/17  1:31 PM  Result Value Ref Range   Sodium 138 135 - 145 mmol/L   Potassium 3.7 3.5 - 5.1 mmol/L   Chloride 95 (L) 101 - 111 mmol/L   CO2 30 22 - 32 mmol/L   Glucose, Bld 133  (H) 65 - 99 mg/dL   BUN 14 6 - 20 mg/dL   Creatinine, Ser 0.56 0.44 - 1.00 mg/dL   Calcium 8.7 (L) 8.9 - 10.3 mg/dL   Total Protein 6.8 6.5 - 8.1 g/dL   Albumin 3.4 (L) 3.5 - 5.0 g/dL   AST 36 15 - 41 U/L   ALT 43 14 - 54 U/L   Alkaline Phosphatase 97 38 - 126 U/L   Total Bilirubin 0.9 0.3 - 1.2 mg/dL   GFR calc non Af Amer >60 >60 mL/min   GFR calc Af Amer >60 >60 mL/min    Comment: (NOTE) The eGFR has been calculated using the CKD EPI equation. This calculation has not been validated in all clinical situations. eGFR's persistently <60 mL/min signify possible Chronic Kidney Disease.    Anion gap 13 5 - 15    Comment: Performed at [facility], [place] [address] [place], [place] [address]  CBC with Differential     Status: Abnormal   Collection Time: 04/03/17  1:31 PM  Result Value Ref Range   WBC 9.2 4.0 - 10.5 K/uL   RBC 5.21 (H) 3.87 - 5.11 MIL/uL   Hemoglobin 14.3 12.0 - 15.0 g/dL   HCT 47.4 (H) 36.0 - 46.0 %  MCV 91.0 78.0 - 100.0 fL   MCH 27.4 26.0 - 34.0 pg   MCHC 30.2 30.0 - 36.0 g/dL   RDW 18.3 (H) 11.5 - 15.5 %   Platelets 259 150 - 400 K/uL   Neutrophils Relative % 78 %   Neutro Abs 7.2 1.7 - 7.7 K/uL   Lymphocytes Relative 15 %   Lymphs Abs 1.4 0.7 - 4.0 K/uL   Monocytes Relative 6 %   Monocytes Absolute 0.5 0.1 - 1.0 K/uL   Eosinophils Relative 1 %   Eosinophils Absolute 0.1 0.0 - 0.7 K/uL   Basophils Relative 0 %   Basophils Absolute 0.0 0.0 - 0.1 K/uL    Comment: Performed at [facility], [place] [address], [place], [place] [address]  Influenza panel by PCR (type A & B)     Status: None   Collection Time: 04/03/17  1:31 PM  Result Value Ref Range   Influenza A By PCR NEGATIVE NEGATIVE   Influenza B By PCR NEGATIVE NEGATIVE    Comment: (NOTE) The Xpert Xpress Flu assay is intended as an aid in the diagnosis of  influenza and should not be used as a sole basis for treatment.  This  assay is FDA approved for  nasopharyngeal swab specimens only. Nasal  washings and aspirates are unacceptable for Xpert Xpress Flu testing. Performed at [facility], [place] [address], [place], [place] [address]   Brain natriuretic peptide     Status: None   Collection Time: 04/03/17  1:31 PM  Result Value Ref Range   B Natriuretic Peptide 90.0 0.0 - 100.0 pg/mL    Comment: Performed at [facility], [place] [address], [place], [place] [address]  I-Stat beta hCG blood, ED     Status: None   Collection Time: 04/03/17  1:39 PM  Result Value Ref Range   I-stat hCG, quantitative <5.0 <5 mIU/mL   Comment 3            Comment:   GEST. AGE      CONC.  (mIU/mL)   <=1 WEEK        5 - 50     2 WEEKS       50 - 500     3 WEEKS       100 - 10,000     4 WEEKS     1,000 - 30,000        FEMALE AND NON-PREGNANT FEMALE:     LESS THAN 5 mIU/mL   I-stat troponin, ED     Status: None   Collection Time: 04/03/17  1:39 PM  Result Value Ref Range   Troponin i, poc 0.01 0.00 - 0.08 ng/mL   Comment 3            Comment: Due to the release kinetics of cTnI, a negative result within the first hours of the onset of symptoms does not rule out myocardial infarction with certainty. If myocardial infarction is still suspected, repeat the test at appropriate intervals.   I-Stat CG4 Lactic Acid, ED     Status: Abnormal   Collection Time: 04/03/17  1:41 PM  Result Value Ref Range   Lactic Acid, Venous 2.31 (HH) 0.5 - 1.9 mmol/L   Comment NOTIFIED PHYSICIAN   I-stat chem 8, ed     Status: Abnormal   Collection Time: 04/03/17  1:42 PM  Result Value Ref Range   Sodium 138 135 - 145 mmol/L  Potassium 3.7 3.5 - 5.1 mmol/L   Chloride 93 (L) 101 - 111 mmol/L   BUN 14 6 - 20 mg/dL   Creatinine, Ser 0.60 0.44 - 1.00 mg/dL   Glucose, Bld 133 (H) 65 - 99 mg/dL   Calcium, Ion 1.08 (L) 1.15 - 1.40 mmol/L   TCO2 37 (H) 22 - 32 mmol/L   Hemoglobin 16.3 (H) 12.0 - 15.0 g/dL   HCT 48.0 (H) 36.0 - 46.0 %   I-Stat CG4 Lactic Acid, ED     Status: None   Collection Time: 04/03/17  3:12 PM  Result Value Ref Range   Lactic Acid, Venous 1.57 0.5 - 1.9 mmol/L   Dg Chest 2 View  Result Date: 04/03/2017 CLINICAL DATA:  Congestion, shortness of breath, coughing EXAM: CHEST  2 VIEW COMPARISON:  None FINDINGS: Diffuse bilateral interstitial thickening. No pleural effusion or pneumothorax. Stable cardiomegaly. No acute osseous abnormality. IMPRESSION: Findings concerning for mild CHF. Electronically Signed   By: Kathreen Devoid   On: 04/03/2017 14:22   Ct Angio Chest Pe W And/or Wo Contrast  Result Date: 04/03/2017 CLINICAL DATA:  Shortness of breath and wheezing EXAM: CT ANGIOGRAPHY CHEST WITH CONTRAST TECHNIQUE: Multidetector CT imaging of the chest was performed using the standard protocol during bolus administration of intravenous contrast. Multiplanar CT image reconstructions and MIPs were obtained to evaluate the vascular anatomy. CONTRAST:  116m ISOVUE-370 IOPAMIDOL (ISOVUE-370) INJECTION 76% COMPARISON:  Chest radiograph April 03, 2017 FINDINGS: Cardiovascular: There is no evident pulmonary embolus. There is no thoracic aortic aneurysm or dissection. The visualized great vessels appear unremarkable. There is a small pericardial effusion. There is enlargement of the main pulmonary outflow tract measuring 3.9 cm in diameter consistent with pulmonary arterial hypertension. Mediastinum/Nodes: Thyroid appears unremarkable. There is no appreciable thoracic adenopathy. No esophageal lesions are evident. Lungs/Pleura: There is patchy atelectatic change throughout both lower lobes. There is milder atelectatic change in the left upper lobe. There is mild interstitial edema. There is no airspace consolidation. There is no appreciable pleural effusion or pleural thickening. Upper Abdomen: There is reflux of contrast into the inferior vena cava and hepatic veins. Visualized upper abdominal structures otherwise appear  unremarkable. Musculoskeletal: There is degenerative change in the thoracic spine. There are no blastic or lytic bone lesions. No Review of the MIP images confirms the above findings. IMPRESSION: 1. No demonstrable pulmonary embolus. No thoracic aortic aneurysm or dissection. 2. Mild interstitial pulmonary edema. Scattered areas of atelectatic change. No airspace consolidation. No appreciable pleural effusion. 3.  Small pericardial effusion. 4.  No demonstrable thoracic adenopathy. 5. Reflux of contrast into the inferior vena cava and hepatic veins, a finding that may be indicating a degree of increase in right heart pressure. Electronically Signed   By: WLowella GripIII M.D.   On: 04/03/2017 14:42    Pending Labs Unresulted Labs (From admission, onward)   Start     Ordered   04/03/17 1453  Blood gas, venous  Once,   STAT     04/03/17 1452   04/03/17 1308  Urinalysis, Routine w reflex microscopic  STAT,   STAT     04/03/17 1307      Vitals/Pain Today's Vitals   04/03/17 1308 04/03/17 1400 04/03/17 1500 04/03/17 1530  BP:  (!) 144/89 139/69 135/78  Pulse:  67 65 60  Resp:  17    Temp:      TempSrc:      SpO2: 100% 94% 96% 99%  Weight:  Height:        Isolation Precautions No active isolations  Medications Medications  iopamidol (ISOVUE-370) 76 % injection (not administered)  albuterol (PROVENTIL) (2.5 MG/3ML) 0.083% nebulizer solution 5 mg (5 mg Nebulization Given 04/03/17 1451)  ipratropium (ATROVENT) nebulizer solution 0.5 mg (0.5 mg Nebulization Given 04/03/17 1451)  iopamidol (ISOVUE-370) 76 % injection 100 mL (100 mLs Intravenous Contrast Given 04/03/17 1422)  furosemide (LASIX) injection 40 mg (40 mg Intravenous Given 04/03/17 1546)    Mobility walks

## 2017-04-03 NOTE — ED Notes (Signed)
Lactic given to MD and RN. 

## 2017-04-03 NOTE — ED Triage Notes (Signed)
Pt complaint of ongoing cough and SOB for a week. Pt sent by primary care to have imaging done to rule out "lung embolism."

## 2017-04-03 NOTE — ED Notes (Signed)
Bed: WA15 Expected date:  Expected time:  Means of arrival:  Comments: Hold for RES 

## 2017-04-03 NOTE — Progress Notes (Addendum)
Subjective:    Patient ID: [name], female    DOB: 10/14/1965, 52 y.o.   MRN: [id number]  HPI  Presents with almost 4 weeks of sinus drainage, sore throat, nasal congestion without fever or chills.  She got better without complete resolution.  One week ago she began to develop worsening sinus pressure with chest pressure/tightness, productive cough with yellow to brown sputum. She has sinus congestion, pressure and pain and teeth pain.  She denies fever or chills.  She has shortness of breath that has worsened this week with difficulty walking short distances.  She denies chest pain.  She has had some LE edema and palpitations at times since she has been sick.  She has taken OTC mucinex DM and  alka-seltzer plus.  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   Review of Systems  Constitutional: Positive for activity change, appetite change and fatigue. Negative for chills, diaphoresis and fever.  HENT: Positive for congestion, sinus pressure and sinus pain. Negative for ear discharge, ear pain, postnasal drip, rhinorrhea, sneezing, sore throat and trouble swallowing.   Eyes: Negative.   Respiratory: Positive for cough, chest tightness and shortness of breath. Negative for wheezing.   Cardiovascular: Positive for palpitations and leg swelling. Negative for chest pain.  Neurological: Positive for headaches.   Past Medical History:  Diagnosis Date  . Allergy   . Diabetes (HCC)   . GERD (gastroesophageal reflux disease)   . Heart murmur 08/28/2015  . Hyperlipemia   . Hypertension   . Obesity   . Venous insufficiency     Social History   Socioeconomic History  . Marital status: Married    Spouse name: Not on file  . Number of children: Not on file  . Years of education: Not on file  . Highest education level: Not on file  Social Needs  . Financial resource strain: Not on file  . Food insecurity - worry: Not on file  . Food insecurity - inability: Not on file    . Transportation needs - medical: Not on file  . Transportation needs - non-medical: Not on file  Occupational History  . Not on file  Tobacco Use  . Smoking status: Never Smoker  . Smokeless tobacco: Never Used  Substance and Sexual Activity  . Alcohol use: Yes    Alcohol/week: 0.0 oz    Comment: Rare  . Drug use: No  . Sexual activity: Yes    Birth control/protection: IUD    Comment: Mirena 12/31/2013-1st intercourse 52 yo-Fewer than 5 partners  Other Topics Concern  . 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 Birth Loraine Leriche removed    . CHOLECYSTECTOMY    . INTRAUTERINE DEVICE INSERTION  12/31/2013   Mirena    Family History  Problem Relation Age of Onset  . Hypertension Father   . Diabetes Father   . Cancer Paternal Grandfather        Stomach  . Cancer Maternal Uncle        Pancreatic  . Cancer Paternal Uncle  Kidney    No Known Allergies  Current Outpatient Medications on File Prior to Visit  Medication Sig Dispense Refill  . Ascorbic Acid (VITAMIN C PO) Take by mouth.    . Cholecalciferol (VITAMIN D3 PO) Take by mouth.    . fluticasone (FLONASE) 50 MCG/ACT nasal spray Place into both nostrils daily.    . hydrochlorothiazide (HYDRODIURIL) 25 MG tablet TAKE 1 TABLET (25 MG TOTAL) BY MOUTH DAILY. 90 tablet 1  . levonorgestrel (MIRENA) 20 MCG/24HR IUD 1 each by Intrauterine route once.    . metFORMIN (GLUCOPHAGE) 1000 MG tablet TAKE 1 TABLET (1,000 MG TOTAL) BY MOUTH 2 (TWO) TIMES DAILY WITH A MEAL. 180 tablet 1  . rosuvastatin (CRESTOR) 5 MG tablet TAKE 1 TABLET (5 MG TOTAL) BY MOUTH DAILY. 90 tablet 1  . Probiotic Product (PROBIOTIC PO) Take by mouth  daily.      Current Facility-Administered Medications on File Prior to Visit  Medication Dose Route Frequency Provider Last Rate Last Dose  . levonorgestrel (MIRENA) 20 MCG/24HR IUD   Intrauterine Once [name], Timothy P, MD        BP (!) 156/100 (BP Location: Left Wrist)   Temp 98.5 F (36.9 C) (Oral)   Wt (!) 412 lb (186.9 kg)   SpO2 94%   BMI 68.56 kg/m     Oxygen 2L Pox 95%, decreased to 1LNC and Pox decreases to 85%. Oxygen increased up to [facility]2LNC.  Objective:   Physical Exam  Constitutional: She appears well-developed and well-nourished. No distress.  Ill-appearing  HENT:  Right Ear: Tympanic membrane normal.  Left Ear: Tympanic membrane normal.  Nose: Mucosal edema and rhinorrhea present. Right sinus exhibits frontal sinus tenderness. Right sinus exhibits no maxillary sinus tenderness. Left sinus exhibits frontal sinus tenderness. Left sinus exhibits no maxillary sinus tenderness.  Mouth/Throat: Uvula is midline, oropharynx is clear and moist and mucous membranes are normal. No oropharyngeal exudate, posterior oropharyngeal edema or posterior oropharyngeal erythema.  Eyes: Conjunctivae are normal. Right eye exhibits no discharge. Left eye exhibits no discharge.  Cardiovascular: Normal rate and regular rhythm. Exam reveals distant heart sounds.  1 + pitting edema to lower extremities  Pulmonary/Chest: No accessory muscle usage. No respiratory distress. She has decreased breath sounds in the right lower field and the left lower field. She has no wheezes. She has no rhonchi. She has no rales.  Lymphadenopathy:    She has no cervical adenopathy.  Skin: Skin is warm, dry and intact. She is not diaphoretic. No cyanosis.  Nursing note and vitals reviewed.     Assessment & Plan:  DD: pneumonia, bronchitis, PE, CHF

## 2017-04-03 NOTE — ED Provider Notes (Signed)
[facility]   CSN: [id number] Arrival date & time: 04/03/17  1251     History   Chief Complaint Chief Complaint  Patient presents with  . Cough  . Shortness of Breath    HPI Genene ChurnSuzanne McKenzie Wehrly is a 52 y.o. female history of diabetes, reflux, hyperlipidemia, hypertension here presenting with shortness of breath, chills, hypoxia.  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  Has been having congestion and cough for the last week or so that is progressively getting worse.  She has some subjective shortness of breath for the last several days and decided to go to her doctor today.  Was noted to be hypoxic about 88% on room air per the PCP and sent here for evaluation.  Patient states that her legs are more swollen than usual but has no history of heart failure.  Has any cardiac stents in the past.  She was supposed to come by EMS but decided to drive herself over.  She was noted to be 64% in triage and was brought immediately back to the room.  The history is provided by the patient.    Past Medical History:  Diagnosis Date  . Allergy   . Diabetes (HCC)   . GERD (gastroesophageal reflux disease)   . Heart murmur 08/28/2015  . Hyperlipemia   . Hypertension   . Obesity   . Venous insufficiency     Patient Active Problem List   Diagnosis Date Noted  . Heart murmur 08/28/2015  . Diabetes (HCC) 03/13/2015  . Chronic venous insufficiency 11/09/2014  . Morbid obesity (HCC) 07/28/2014  . GERD (gastroesophageal reflux disease) 07/28/2014  . Seasonal allergies 07/28/2014    Past Surgical History:  Procedure Laterality Date  . Birth Loraine LericheMark removed    . CHOLECYSTECTOMY    . INTRAUTERINE DEVICE INSERTION  12/31/2013   Mirena    OB History    Gravida Para Term Preterm AB Living   2 2       2    SAB TAB Ectopic Multiple Live Births                   Home Medications    Prior to Admission medications    Medication Sig Start Date End Date Taking? Authorizing Provider  acetaminophen (TYLENOL) 500 MG tablet Take 1,000 mg by mouth daily as needed (PAIN).   Yes [provider]  Ascorbic Acid (VITAMIN C PO) Take by mouth.   Yes [provider]  Chlorphen-Phenyleph-ASA (ALKA-SELTZER PLUS COLD PO) Take 2 tablets by mouth daily as needed (HEAD CONGESTION, COUGH).   Yes [provider]  Cholecalciferol (VITAMIN D3 PO) Take by mouth.   Yes [provider]  dextromethorphan-guaiFENesin (MUCINEX DM) 30-600 MG 12hr tablet Take 1 tablet by mouth at bedtime as needed for cough.   Yes [provider]  fluticasone (FLONASE) 50 MCG/ACT nasal spray Place 1 spray into both nostrils 2 (two) times daily.    Yes [provider]  hydrochlorothiazide (HYDRODIURIL) 25 MG tablet TAKE 1 TABLET (25 MG TOTAL) BY MOUTH DAILY. 01/13/17  Yes Kriste BasqueKim, Hannah R, DO  ibuprofen (ADVIL,MOTRIN) 200 MG tablet Take 400-600 mg by mouth daily as needed (pain).   Yes [provider]  levonorgestrel (MIRENA) 20 MCG/24HR IUD 1 each by Intrauterine route once.   Yes [provider]  metFORMIN (GLUCOPHAGE) 1000 MG tablet TAKE 1 TABLET (1,000 MG TOTAL) BY MOUTH 2 (TWO)  TIMES DAILY WITH A MEAL. 12/06/16  Yes Terressa Koyanagi, DO  Multiple Vitamins-Minerals (WOMENS DAILY FORMULA PO) Take 1 tablet by mouth daily.   Yes [provider]  naproxen sodium (ALEVE) 220 MG tablet Take 220 mg by mouth at bedtime as needed (KNEE PAIN).   Yes [provider]  rosuvastatin (CRESTOR) 5 MG tablet TAKE 1 TABLET (5 MG TOTAL) BY MOUTH DAILY. 11/18/16  Yes Terressa Koyanagi, DO    Family History Family History  Problem Relation Age of Onset  . Hypertension Father   . Diabetes Father   . Cancer Paternal Grandfather        Stomach  . Cancer Maternal Uncle        Pancreatic  . Cancer Paternal Uncle        Kidney    Social History Social History   Tobacco Use  . Smoking  status: Never Smoker  . Smokeless tobacco: Never Used  Substance Use Topics  . Alcohol use: Yes    Alcohol/week: 0.0 oz    Comment: Rare  . Drug use: No     Allergies   Patient has no known allergies.   Review of Systems Review of Systems  Respiratory: Positive for cough and shortness of breath.      Physical Exam Updated Vital Signs BP 139/69   Pulse 65   Temp 97.7 F (36.5 C) (Oral)   Resp 17   Ht 5\' 6"  (1.676 m)   Wt (!) 186.9 kg (412 lb)   SpO2 96%   BMI 66.50 kg/m   Physical Exam  Constitutional: She is oriented to person, place, and time.  Tachypneic, moderate respiratory distress   HENT:  Head: Normocephalic.  Mouth/Throat: Oropharynx is clear and moist.  Eyes: EOM are normal. Pupils are equal, round, and reactive to light.  Neck: Normal range of motion.  Cardiovascular: Normal rate.  Pulmonary/Chest:  Tachypneic, moderate distress. Minimal wheezing throughout, no obvious crackles   Abdominal: Soft. Bowel sounds are normal.  Musculoskeletal: Normal range of motion.  1+ edema bilateral legs, no calf tenderness   Neurological: She is alert and oriented to person, place, and time.  Skin: Skin is warm.  Psychiatric: She has a normal mood and affect. Her behavior is normal.  Nursing note and vitals reviewed.    ED Treatments / Results  Labs (all labs ordered are listed, but only abnormal results are displayed) Labs Reviewed  COMPREHENSIVE METABOLIC PANEL - Abnormal; Notable for the following components:      Result Value   Chloride 95 (*)    Glucose, Bld 133 (*)    Calcium 8.7 (*)    Albumin 3.4 (*)    All other components within normal limits  CBC WITH DIFFERENTIAL/PLATELET - Abnormal; Notable for the following components:   RBC 5.21 (*)    HCT 47.4 (*)    RDW 18.3 (*)    All other components within normal limits  I-STAT CG4 LACTIC ACID, ED - Abnormal; Notable for the following components:   Lactic Acid, Venous 2.31 (*)    All other  components within normal limits  I-STAT CHEM 8, ED - Abnormal; Notable for the following components:   Chloride 93 (*)    Glucose, Bld 133 (*)    Calcium, Ion 1.08 (*)    TCO2 37 (*)    Hemoglobin 16.3 (*)    HCT 48.0 (*)    All other components within normal limits  INFLUENZA PANEL BY PCR (TYPE A &  B)  BRAIN NATRIURETIC PEPTIDE  URINALYSIS, ROUTINE W REFLEX MICROSCOPIC  BLOOD GAS, VENOUS  I-STAT BETA HCG BLOOD, ED ([facility], [facility], [facility] ONLY)  I-STAT TROPONIN, ED  I-STAT CG4 LACTIC ACID, ED    EKG  EKG Interpretation  Date/Time:  Thursday April 03 2017 13:59:35 EST Ventricular Rate:  72 PR Interval:  126 QRS Duration: 102 QT Interval:  416 QTC Calculation: 456 R Axis:   103 Text Interpretation:  Sinus rhythm Right axis deviation Borderline T abnormalities, anterior leads No significant change since last tracing Confirmed by Richardean Canal [phone]) on 04/03/2017 2:03:39 PM       Radiology Dg Chest 2 View  Result Date: 04/03/2017 CLINICAL DATA:  Congestion, shortness of breath, coughing EXAM: CHEST  2 VIEW COMPARISON:  None FINDINGS: Diffuse bilateral interstitial thickening. No pleural effusion or pneumothorax. Stable cardiomegaly. No acute osseous abnormality. IMPRESSION: Findings concerning for mild CHF. Electronically Signed   By: Elige Ko   On: 04/03/2017 14:22   Ct Angio Chest Pe W And/or Wo Contrast  Result Date: 04/03/2017 CLINICAL DATA:  Shortness of breath and wheezing EXAM: CT ANGIOGRAPHY CHEST WITH CONTRAST TECHNIQUE: Multidetector CT imaging of the chest was performed using the standard protocol during bolus administration of intravenous contrast. Multiplanar CT image reconstructions and MIPs were obtained to evaluate the vascular anatomy. CONTRAST:  ISOVUE-370 IOPAMIDOL (ISOVUE-370) INJECTION 76% COMPARISON:  Chest radiograph April 03, 2017 FINDINGS: Cardiovascular: There is no evident pulmonary embolus. There is no thoracic aortic aneurysm or dissection. The  visualized great vessels appear unremarkable. There is a small pericardial effusion. There is enlargement of the main pulmonary outflow tract measuring 3.9 cm in diameter consistent with pulmonary arterial hypertension. Mediastinum/Nodes: Thyroid appears unremarkable. There is no appreciable thoracic adenopathy. No esophageal lesions are evident. Lungs/Pleura: There is patchy atelectatic change throughout both lower lobes. There is milder atelectatic change in the left upper lobe. There is mild interstitial edema. There is no airspace consolidation. There is no appreciable pleural effusion or pleural thickening. Upper Abdomen: There is reflux of contrast into the inferior vena cava and hepatic veins. Visualized upper abdominal structures otherwise appear unremarkable. Musculoskeletal: There is degenerative change in the thoracic spine. There are no blastic or lytic bone lesions. No Review of the MIP images confirms the above findings. IMPRESSION: 1. No demonstrable pulmonary embolus. No thoracic aortic aneurysm or dissection. 2. Mild interstitial pulmonary edema. Scattered areas of atelectatic change. No airspace consolidation. No appreciable pleural effusion. 3.  Small pericardial effusion. 4.  No demonstrable thoracic adenopathy. 5. Reflux of contrast into the inferior vena cava and hepatic veins, a finding that may be indicating a degree of increase in right heart pressure. Electronically Signed   By: Bretta Bang III M.D.   On: 04/03/2017 14:42    Procedures Procedures (including critical care time)  Medications Ordered in ED Medications  iopamidol (ISOVUE-370) 76 % injection (not administered)  furosemide (LASIX) injection 40 mg (not administered)  albuterol (PROVENTIL) (2.5 MG/3ML) 0.083% nebulizer solution 5 mg (5 mg Nebulization Given 04/03/17 1451)  ipratropium (ATROVENT) nebulizer solution 0.5 mg (0.5 mg Nebulization Given 04/03/17 1451)  iopamidol (ISOVUE-370) 76 % injection 100 mL (100 mLs  Intravenous Contrast Given 04/03/17 1422)     Initial Impression / Assessment and Plan / ED Course  I have reviewed the triage vital signs and the nursing notes.  Pertinent labs & imaging results that were available during my care of the patient were reviewed by me and considered in my  medical decision making (see chart for details).    Geetika Laborde is a 52 y.o. female here with SOB, cough, chills, hypoxia. Patient is obese but has no hx of obesity hypoventilation syndrome or OSA or heart failure. 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 Concerned for new onset heart failure vs PE vs flu syndrome with hypoxia. Will get labs, CT PE, BNP. Will swab for flu and likely admit.   3:16 PM CTA showed pulmonary edema. O2 94-95% on 4 L [place]. BNP 90. Flu negative. Ordered lasix 40 mg IV and solumedrol. Will admit for hypoxia likely from pulmonary edema, COPD.    Final Clinical Impressions(s) / ED Diagnoses   Final diagnoses:  Acute pulmonary edema (HCC)  COPD exacerbation [facility])    ED Discharge Orders    None       Charlynne Pander, MD 04/03/17 [phone]

## 2017-04-03 NOTE — Progress Notes (Signed)
Subjective:    Patient ID: [name], female    DOB: [birth year], 52 y.o.   MRN: [id number]  HPI  52 year old female who  has a past medical history of Allergy, Diabetes (HCC), GERD (gastroesophageal reflux disease), Heart murmur (08/28/2015), Hyperlipemia, Hypertension, Obesity, and Venous insufficiency.  She presents to the office today for shortness of breath productive cough, wheezing, and sinus pain and pressure.  Reports that her symptoms started in January with a "cold" was getting better than a week and a half her symptoms resume.  She has been using Mucinex DM at home with minimal more relief.   Review of Systems  Constitutional: Positive for activity change and fatigue.  HENT: Positive for congestion, postnasal drip, rhinorrhea, sinus pressure and sinus pain.   Respiratory: Positive for cough, shortness of breath and wheezing.   Cardiovascular: Negative.   Gastrointestinal: Negative.   All other systems reviewed and are negative.     Past Medical History:  Diagnosis Date  . Allergy   . Diabetes (HCC)   . GERD (gastroesophageal reflux disease)   . Heart murmur 08/28/2015  . Hyperlipemia   . Hypertension   . Obesity   . Venous insufficiency     Social History   Socioeconomic History  . Marital status: Married    Spouse name: Not on file  . Number of children: Not on file  . Years of education: Not on file  . Highest education level: Not on file  Social Needs  . Financial resource strain: Not on file  . Food insecurity - worry: Not on file  . Food insecurity - inability: Not on file  . Transportation needs - medical: Not on file  . Transportation needs - non-medical: Not on file  Occupational History  . Not on file  Tobacco Use  . Smoking status: Never Smoker  . Smokeless tobacco: Never Used  Substance and Sexual Activity  . Alcohol use: Yes    Alcohol/week: 0.0 oz    Comment: Rare  . Drug use: No  . Sexual activity: Yes    Birth  control/protection: IUD    Comment: Mirena 12/31/2013-1st intercourse 52 yo-Fewer than 5 partners  Other Topics Concern  . 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 Birth Loraine Leriche removed    . CHOLECYSTECTOMY    . INTRAUTERINE DEVICE INSERTION  12/31/2013   Mirena    Family History  Problem Relation Age of Onset  . Hypertension Father   . Diabetes Father   . Cancer Paternal Grandfather        Stomach  . Cancer Maternal Uncle        Pancreatic  . Cancer Paternal Uncle        Kidney    No Known Allergies  Current Outpatient Medications on File Prior to Visit  Medication Sig Dispense Refill  . Ascorbic Acid (VITAMIN C PO) Take by mouth.    . Cholecalciferol (VITAMIN D3 PO) Take by mouth.    . fluticasone (FLONASE) 50 MCG/ACT nasal spray Place into both nostrils daily.    . hydrochlorothiazide (HYDRODIURIL)  25 MG tablet TAKE 1 TABLET (25 MG TOTAL) BY MOUTH DAILY. 90 tablet 1  . levonorgestrel (MIRENA) 20 MCG/24HR IUD 1 each by Intrauterine route once.    . metFORMIN (GLUCOPHAGE) 1000 MG tablet TAKE 1 TABLET (1,000 MG TOTAL) BY MOUTH 2 (TWO) TIMES DAILY WITH A MEAL. 180 tablet 1  . rosuvastatin (CRESTOR) 5 MG tablet TAKE 1 TABLET (5 MG TOTAL) BY MOUTH DAILY. 90 tablet 1  . Probiotic Product (PROBIOTIC PO) Take by mouth daily.      Current Facility-Administered Medications on File Prior to Visit  Medication Dose Route Frequency Provider Last Rate Last Dose  . levonorgestrel (MIRENA) 20 MCG/24HR IUD   Intrauterine Once [name], Timothy P, MD        BP (!) 156/100   Temp 98.5 F (36.9 C) (Oral)   Wt (!) 412 lb (186.9 kg)   SpO2 (!) 85%   BMI 68.56 kg/m       Objective:     Physical Exam  Constitutional: She is oriented to person, place, and time. She appears well-developed and well-nourished. No distress.  Morbidly obese   HENT:  Head: Normocephalic and atraumatic.  Right Ear: Hearing, tympanic membrane, external ear and ear canal normal.  Left Ear: Hearing, tympanic membrane, external ear and ear canal normal.  Nose: Mucosal edema present. No rhinorrhea. Right sinus exhibits maxillary sinus tenderness. Right sinus exhibits no frontal sinus tenderness. Left sinus exhibits maxillary sinus tenderness. Left sinus exhibits no frontal sinus tenderness.  Mouth/Throat: Uvula is midline, oropharynx is clear and moist and mucous membranes are normal. No oropharyngeal exudate.  Eyes: Conjunctivae and EOM are normal. Pupils are equal, round, and reactive to light. Right eye exhibits no discharge. Left eye exhibits no discharge. No scleral icterus.  Neck: Normal range of motion. Neck supple. No thyromegaly present.  Cardiovascular: Normal rate, regular rhythm, normal heart sounds and intact distal pulses. Exam reveals no gallop and no friction rub.  No murmur heard. Pulmonary/Chest: Effort normal and breath sounds normal. No respiratory distress. She has no wheezes. She has no rales. She exhibits no tenderness.  Musculoskeletal: She exhibits no edema or tenderness.  No calf tenderness, pain, swelling, redness, or warmth  Lymphadenopathy:    She has no cervical adenopathy.  Neurological: She is alert and oriented to person, place, and time.  Skin: Skin is warm and dry. No rash noted. She is not diaphoretic. No erythema. No pallor.  Psychiatric: She has a normal mood and affect. Her behavior is normal. Judgment and thought content normal.  Nursing note and vitals reviewed.     Assessment & Plan:  On presentation to the room it was noted that her oxygen saturation was 85% on room air.  She was short of breath while walking down the hallway.  Oxygen was applied by CMA and  at 2 L her oxygen saturation bumped up to 92%.  Oxygen was further increased to 4 L at which time her oxygen saturation peaked at 9798%.  2-3 minutes after being taken off O2, sats again dropped to 87-88%.  Lungs were clear on exam, heart rate is normal.  She is not retracting or using accessory muscles no nasal flaring.  She denied any signs of a DVT.  Wells score was 6.  Likely pneumonia but due to her not been able to maintain oxygen saturation, she is morbidly obese and has a Mirena IUD that I advised her to go to the emergency room for further evaluation.  She is okay  with doing this.  I advised EMS transfer at which time she adamantly refused.  [facility] emergency room notified the patient was coming via private vehicle.

## 2017-04-04 ENCOUNTER — Encounter (HOSPITAL_COMMUNITY): Payer: Self-pay | Admitting: Cardiology

## 2017-04-04 ENCOUNTER — Inpatient Hospital Stay (HOSPITAL_COMMUNITY): Payer: BC Managed Care – PPO

## 2017-04-04 DIAGNOSIS — J9601 Acute respiratory failure with hypoxia: Secondary | ICD-10-CM

## 2017-04-04 DIAGNOSIS — I509 Heart failure, unspecified: Secondary | ICD-10-CM

## 2017-04-04 DIAGNOSIS — I5031 Acute diastolic (congestive) heart failure: Secondary | ICD-10-CM

## 2017-04-04 LAB — GLUCOSE, CAPILLARY
GLUCOSE-CAPILLARY: 144 mg/dL — AB (ref 65–99)
GLUCOSE-CAPILLARY: 111 mg/dL — AB (ref 65–99)
GLUCOSE-CAPILLARY: 138 mg/dL — AB (ref 65–99)
Glucose-Capillary: 156 mg/dL — ABNORMAL HIGH (ref 65–99)

## 2017-04-04 LAB — BASIC METABOLIC PANEL
ANION GAP: 12 (ref 5–15)
BUN: 14 mg/dL (ref 6–20)
CO2: 34 mmol/L — ABNORMAL HIGH (ref 22–32)
Calcium: 8.7 mg/dL — ABNORMAL LOW (ref 8.9–10.3)
Chloride: 92 mmol/L — ABNORMAL LOW (ref 101–111)
Creatinine, Ser: 0.66 mg/dL (ref 0.44–1.00)
GFR calc Af Amer: >60 mL/min (ref >60)
Glucose, Bld: 149 mg/dL — ABNORMAL HIGH (ref 65–99)
Potassium: 3.6 mmol/L (ref 3.5–5.1)
SODIUM: 138 mmol/L (ref 135–145)

## 2017-04-04 LAB — ECHOCARDIOGRAM COMPLETE
Height: 66 in
Weight: 6553.6 oz

## 2017-04-04 NOTE — Progress Notes (Addendum)
Patient ID: [name], female   DOB: Dec 31, 1965, 52 y.o.   MRN: [id number]  PROGRESS NOTE    [name]  WUJ:[id number] DOB: 11-20-1965 DOA: 04/03/2017 PCP: Terressa Koyanagi, DO   Brief Narrative:  52 year old female with history of morbid obesity, hypertension, diabetes presented with shortness of breath and cough.  She was admitted with new onset congestive heart failure and started on intravenous Lasix.   Assessment & Plan:   Principal Problem:   CHF (congestive heart failure) (HCC) Active Problems:   Morbid obesity (HCC)   Chronic venous insufficiency   Diabetes (HCC)   Acute respiratory failure with hypoxia (HCC)   Probable acute decompensated diastolic heart failure -Patient has been started on intravenous Lasix.  Continue strict input and output.  Daily weights. -Negative balance of 4737 mL since admission -Cardiology consulted -Echo pending -Repeat chest x-ray in a.m.  Acute hypoxic respiratory failure -Probably secondary to above.  On my exam, patient was still requiring 4 L oxygen.  Will try and wean off as able. -CT chest was negative for PE -Influenza negative -Patient probably also has undiagnosed sleep apnea or obesity hypoventilation syndrome.  Might need outpatient sleep study.  Morbid obesity -Outpatient follow-up  Chronic venous insufficiency -Outpatient follow-up  Diabetes mellitus type II -Metformin on hold.  Follow Accu-Cheks with sliding scale coverage. hemoglobin A1c is 7.7   DVT prophylaxis: SCDs Code Status: Full Family Communication: None at bedside Disposition Plan: Home in 1-2 days  Consultants: Cardiology  Procedures: Echo pending  Antimicrobials: None   Subjective: Patient seen and examined at bedside.  She feels slightly better.  She still short of breath and coughing.  No overnight fever, nausea or vomiting or chest pain.  Objective: Vitals:   04/03/17 1652 04/03/17 2036 04/04/17 0459 04/04/17 1200  BP: (!)  149/54 (!) 130/53 (!) 156/50   Pulse: 66 70 80   Resp: 20 20 20    Temp: 98.4 F (36.9 C) 98.7 F (37.1 C) 98.7 F (37.1 C)   TempSrc: Oral Oral Oral   SpO2: 96% 97% 96% 94%  Weight: (!) 174.2 kg (384 lb)  (!) 185.8 kg (409 lb 9.6 oz)   Height: 5\' 6"  (1.676 m)       Intake/Output Summary (Last 24 hours) at 04/04/2017 1340 Last data filed at 04/04/2017 1214 Gross per 24 hour  Intake 963 ml  Output 5700 ml  Net -4737 ml   Filed Weights   04/03/17 1306 04/03/17 1652 04/04/17 0459  Weight: (!) 186.9 kg (412 lb) (!) 174.2 kg (384 lb) (!) 185.8 kg (409 lb 9.6 oz)    Examination:  General exam: Appears calm and comfortable.  No acute distress Respiratory system: Bilateral decreased breath sound at bases; scattered basilar crackles Cardiovascular system: S1 & S2 heard, rate controlled  gastrointestinal system: Abdomen is morbidly obese, nondistended, soft and nontender. Normal bowel sounds heard. Central nervous system: Alert and oriented. No focal neurological deficits. Moving extremities Extremities: No cyanosis, clubbing; 1-2+ edema  Skin: No rashes, lesions or ulcers Psychiatry: Judgement and insight appear normal. Mood & affect appropriate.     Data Reviewed: I have personally reviewed following labs and imaging studies  CBC: Recent Labs  Lab 04/03/17 1331 04/03/17 1342  WBC 9.2  --   NEUTROABS 7.2  --   HGB 14.3 16.3*  HCT 47.4* 48.0*  MCV 91.0  --   PLT 259  --    Basic Metabolic Panel: Recent Labs  Lab 04/03/17 1331 04/03/17  1342 04/04/17 0409  NA 138 138 138  K 3.7 3.7 3.6  CL 95* 93* 92*  CO2 30  --  34*  GLUCOSE 133* 133* 149*  BUN 14 14 14   CREATININE 0.56 0.60 0.66  CALCIUM 8.7*  --  8.7*   GFR: Estimated Creatinine Clearance: 144.3 mL/min (by C-G formula based on SCr of 0.66 mg/dL). Liver Function Tests: Recent Labs  Lab 04/03/17 1331  AST 36  ALT 43  ALKPHOS 97  BILITOT 0.9  PROT 6.8  ALBUMIN 3.4*   No results for input(s): LIPASE,  AMYLASE in the last 168 hours. No results for input(s): AMMONIA in the last 168 hours. Coagulation Profile: No results for input(s): INR, PROTIME in the last 168 hours. Cardiac Enzymes: No results for input(s): CKTOTAL, CKMB, CKMBINDEX, TROPONINI in the last 168 hours. BNP (last 3 results) No results for input(s): PROBNP in the last 8760 hours. HbA1C: Recent Labs    04/03/17 1929  HGBA1C 7.7*   CBG: Recent Labs  Lab 04/03/17 1704 04/03/17 2114 04/04/17 0746 04/04/17 1110  GLUCAP 114* 162* 144* 156*   Lipid Profile: No results for input(s): CHOL, HDL, LDLCALC, TRIG, CHOLHDL, LDLDIRECT in the last 72 hours. Thyroid Function Tests: No results for input(s): TSH, T4TOTAL, FREET4, T3FREE, THYROIDAB in the last 72 hours. Anemia Panel: No results for input(s): VITAMINB12, FOLATE, FERRITIN, TIBC, IRON, RETICCTPCT in the last 72 hours. Sepsis Labs: Recent Labs  Lab 04/03/17 1341 04/03/17 1512  LATICACIDVEN 2.31* 1.57    No results found for this or any previous visit (from the past 240 hour(s)).       Radiology Studies: Dg Chest 2 View  Result Date: 04/03/2017 CLINICAL DATA:  Congestion, shortness of breath, coughing EXAM: CHEST  2 VIEW COMPARISON:  None FINDINGS: Diffuse bilateral interstitial thickening. No pleural effusion or pneumothorax. Stable cardiomegaly. No acute osseous abnormality. IMPRESSION: Findings concerning for mild CHF. Electronically Signed   By: Elige KoHetal  Patel   On: 04/03/2017 14:22   Ct Angio Chest Pe W And/or Wo Contrast  Result Date: 04/03/2017 CLINICAL DATA:  Shortness of breath and wheezing EXAM: CT ANGIOGRAPHY CHEST WITH CONTRAST TECHNIQUE: Multidetector CT imaging of the chest was performed using the standard protocol during bolus administration of intravenous contrast. Multiplanar CT image reconstructions and MIPs were obtained to evaluate the vascular anatomy. CONTRAST:  100mL ISOVUE-370 IOPAMIDOL (ISOVUE-370) INJECTION 76% COMPARISON:  Chest  radiograph April 03, 2017 FINDINGS: Cardiovascular: There is no evident pulmonary embolus. There is no thoracic aortic aneurysm or dissection. The visualized great vessels appear unremarkable. There is a small pericardial effusion. There is enlargement of the main pulmonary outflow tract measuring 3.9 cm in diameter consistent with pulmonary arterial hypertension. Mediastinum/Nodes: Thyroid appears unremarkable. There is no appreciable thoracic adenopathy. No esophageal lesions are evident. Lungs/Pleura: There is patchy atelectatic change throughout both lower lobes. There is milder atelectatic change in the left upper lobe. There is mild interstitial edema. There is no airspace consolidation. There is no appreciable pleural effusion or pleural thickening. Upper Abdomen: There is reflux of contrast into the inferior vena cava and hepatic veins. Visualized upper abdominal structures otherwise appear unremarkable. Musculoskeletal: There is degenerative change in the thoracic spine. There are no blastic or lytic bone lesions. No Review of the MIP images confirms the above findings. IMPRESSION: 1. No demonstrable pulmonary embolus. No thoracic aortic aneurysm or dissection. 2. Mild interstitial pulmonary edema. Scattered areas of atelectatic change. No airspace consolidation. No appreciable pleural effusion. 3.  Small pericardial effusion.  4.  No demonstrable thoracic adenopathy. 5. Reflux of contrast into the inferior vena cava and hepatic veins, a finding that may be indicating a degree of increase in right heart pressure. Electronically Signed   By: Bretta Bang III M.D.   On: 04/03/2017 14:42        Scheduled Meds: . aspirin EC  81 mg Oral Daily  . fluticasone  1 spray Each Nare BID  . furosemide  40 mg Intravenous BID  . hydrochlorothiazide  25 mg Oral Daily  . insulin aspart  0-9 Units Subcutaneous TID WC  . rosuvastatin  5 mg Oral Daily  . sodium chloride flush  3 mL Intravenous Q12H    Continuous Infusions: . sodium chloride       LOS: 1 day        Glade Lloyd, MD [facility] Pager [phone]  If 7PM-7AM, please contact night-coverage [email or web address] Password [email or web address] 04/04/2017, 1:40 PM

## 2017-04-04 NOTE — Consult Note (Addendum)
Cardiology Consultation:   [name] ID: [name]; [id number]; 1965/11/02   Admit date: 04/03/2017 Date of Consult: 04/04/2017  Primary Care Provider: Terressa Koyanagi, DO Primary Cardiologist: New Primary Electrophysiologist:  NA   [name] Profile:   [name] is a 52 y.o. female with a hx of morbid obesity, HTN, DM and now resp infection and SOB who is being seen today for the evaluation of CHF at the request of Dr. Hanley Ben.  History of Present Illness:   [name] has a hx of morbid obesity, HTN, DM presented to ER yesterday 04/03/17 with URI and SOB and lower ext edema.  No fevers, N,V or diarrhea.  No chest pain.    In ER found to by hypoxic on RA with SP02 in low 80s.  No prior cardiac issues.      EKG:  The EKG was personally reviewed and demonstrates:  SR RAD, mild nonspecific T wave abnormalities.  No old to compare.  Telemetry:  Telemetry was personally reviewed and demonstrates:  SR  BNP 90, troponin 0.01  Na 138 K+ 3.6, Cr 0.66 WBC 9.2, Hgb 14.3, Plts 259 HGB A1c 7.7 Viral tests neg Neg preg   CTA of chest  Neg for PE, neg aortic dissection, +enlargement of the main pulmonary outflow tract measuring 3.9 cm in diameter consistent with pulmonary arterial hypertension.  Small pericardial effusion. Mild interstitial pul. edema.  CXR mild CHF  Currently some DOE walking to BR, no chest pain and has not had any.  No palpitations or tachycardia.  Has had difficulty with exercise after 2 weeks still very hard to keep going due to fatigue and SOB.   Last week is when the SOB increased.  + productive cough with dark, thick sputum.     [name] does eat out a lot, fast food, but is trying to eat more healthy.  [name] would prefer not to have bariatric surgery, we discussed weight management through hospital setting.  [name] is more interested in this.   Aware that [name] needs counseling as well which hospital program will add.   [name] does snore, but [name] husband feels this is  less.     Past Medical History:  Diagnosis Date  . Allergy   . Diabetes (HCC)   . GERD (gastroesophageal reflux disease)   . Heart murmur 08/28/2015  . Hyperlipemia   . Hypertension   . Obesity   . Venous insufficiency     Past Surgical History:  Procedure Laterality Date  . Birth Loraine Leriche removed    . CHOLECYSTECTOMY    . INTRAUTERINE DEVICE INSERTION  12/31/2013   Mirena     Home Medications:  Prior to Admission medications   Medication Sig Start Date End Date Taking? Authorizing Provider  acetaminophen (TYLENOL) 500 MG tablet Take 1,000 mg by mouth daily as needed (PAIN).   Yes [provider]  Ascorbic Acid (VITAMIN C PO) Take by mouth.   Yes [provider]  Chlorphen-Phenyleph-ASA (ALKA-SELTZER PLUS COLD PO) Take 2 tablets by mouth daily as needed (HEAD CONGESTION, COUGH).   Yes [provider]  Cholecalciferol (VITAMIN D3 PO) Take by mouth.   Yes [provider]  dextromethorphan-guaiFENesin (MUCINEX DM) 30-600 MG 12hr tablet Take 1 tablet by mouth at bedtime as needed for cough.   Yes [provider]  fluticasone (FLONASE) 50 MCG/ACT nasal spray Place 1 spray into both nostrils 2 (two) times daily.    Yes [provider]  hydrochlorothiazide (HYDRODIURIL) 25 MG  tablet TAKE 1 TABLET (25 MG TOTAL) BY MOUTH DAILY. 01/13/17  Yes Kriste Basque R, DO  ibuprofen (ADVIL,MOTRIN) 200 MG tablet Take 400-600 mg by mouth daily as needed (pain).   Yes [provider]  levonorgestrel (MIRENA) 20 MCG/24HR IUD 1 each by Intrauterine route once.   Yes [provider]  metFORMIN (GLUCOPHAGE) 1000 MG tablet TAKE 1 TABLET (1,000 MG TOTAL) BY MOUTH 2 (TWO) TIMES DAILY WITH A MEAL. 12/06/16  Yes Terressa Koyanagi, DO  Multiple Vitamins-Minerals (WOMENS DAILY FORMULA PO) Take 1 tablet by mouth daily.   Yes [provider]  naproxen sodium (ALEVE) 220 MG tablet Take 220 mg by mouth at bedtime as needed (KNEE PAIN).   Yes  [provider]  rosuvastatin (CRESTOR) 5 MG tablet TAKE 1 TABLET (5 MG TOTAL) BY MOUTH DAILY. 11/18/16  Yes Terressa Koyanagi, DO    Inpatient Medications: Scheduled Meds: . aspirin EC  81 mg Oral Daily  . fluticasone  1 spray Each Nare BID  . furosemide  40 mg Intravenous BID  . hydrochlorothiazide  25 mg Oral Daily  . insulin aspart  0-9 Units Subcutaneous TID WC  . rosuvastatin  5 mg Oral Daily  . sodium chloride flush  3 mL Intravenous Q12H   Continuous Infusions: . sodium chloride     PRN Meds: sodium chloride, acetaminophen, dextromethorphan-guaiFENesin, ibuprofen, ondansetron (ZOFRAN) IV, sodium chloride flush  Allergies:   No Known Allergies  Social History:   Social History   Socioeconomic History  . Marital status: Married    Spouse name: Not on file  . Number of children: Not on file  . Years of education: Not on file  . Highest education level: Not on file  Social Needs  . Financial resource strain: Not on file  . Food insecurity - worry: Not on file  . Food insecurity - inability: Not on file  . Transportation needs - medical: Not on file  . Transportation needs - non-medical: Not on file  Occupational History  . Not on file  Tobacco Use  . Smoking status: Never Smoker  . Smokeless tobacco: Never Used  Substance and Sexual Activity  . Alcohol use: Yes    Alcohol/week: 0.0 oz    Comment: Rare  . Drug use: No  . Sexual activity: Yes    Birth control/protection: IUD    Comment: Mirena 12/31/2013-1st intercourse 52 yo-Fewer than 5 partners  Other Topics Concern  . 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 Hypertension Father     . Diabetes Father   . Cancer Paternal Grandfather        Stomach  . Cancer Maternal Uncle        Pancreatic  . Cancer Paternal Uncle        Kidney     ROS:  Please see the history of present illness.  General:+ colds no fevers, no weight changes Skin:no rashes or ulcers HEENT:no blurred vision, no congestion CV:see HPI PUL:see HPI GI:no diarrhea constipation or melena, no indigestion GU:no hematuria, no dysuria MS:no joint  pain, no claudication Neuro:no syncope, no lightheadedness Endo:+ diabetes, no thyroid disease  All other ROS reviewed and negative.     Physical Exam/Data:   Vitals:   04/03/17 1530 04/03/17 1652 04/03/17 2036 04/04/17 0459  BP: 135/78 (!) 149/54 (!) 130/53 (!) 156/50  Pulse: 60 66 70 80  Resp:  20 20 20   Temp:  98.4 F (36.9 C) 98.7 F (37.1 C) 98.7 F (37.1 C)  TempSrc:  Oral Oral Oral  SpO2: 99% 96% 97% 96%  Weight:  (!) 384 lb (174.2 kg)  (!) 409 lb 9.6 oz (185.8 kg)  Height:  5\' 6"  (1.676 m)      Intake/Output Summary (Last 24 hours) at 04/04/2017 0941 Last data filed at 04/04/2017 9562 Gross per 24 hour  Intake 723 ml  Output 3200 ml  Net -2477 ml   Filed Weights   04/03/17 1306 04/03/17 1652 04/04/17 0459  Weight: (!) 412 lb (186.9 kg) (!) 384 lb (174.2 kg) (!) 409 lb 9.6 oz (185.8 kg)   Body mass index is 66.11 kg/m.  General:  Well nourished, well developed, obese in no acute distress HEENT: normal Lymph: no adenopathy Neck: no JVD Endocrine:  No thryomegaly Vascular: No carotid bruits; pedal pulses 2+ bilaterally  Cardiac:  normal S1, S2; RRR; no murmur, gallup rub or click  Lungs:  clear to diminished breath sounds to auscultation bilaterally, no wheezing, rhonchi or rales  Abd: obese, soft, nontender, no hepatomegaly  Ext: 2+ edema to knees some redness Musculoskeletal:  No deformities, BUE and BLE strength normal and equal Skin: warm and dry  Neuro:  Alert and oriented X 3 MAE, follows commands, no focal abnormalities  noted Psych:  Normal affect    Relevant CV Studies: Echo pending  Laboratory Data:  Chemistry Recent Labs  Lab 04/03/17 1331 04/03/17 1342 04/04/17 0409  NA 138 138 138  K 3.7 3.7 3.6  CL 95* 93* 92*  CO2 30  --  34*  GLUCOSE 133* 133* 149*  BUN 14 14 14   CREATININE 0.56 0.60 0.66  CALCIUM 8.7*  --  8.7*  GFRNONAA >60  --  >60  GFRAA >60  --  >60  ANIONGAP 13  --  12    Recent Labs  Lab 04/03/17 1331  PROT 6.8  ALBUMIN 3.4*  AST 36  ALT 43  ALKPHOS 97  BILITOT 0.9   Hematology Recent Labs  Lab 04/03/17 1331 04/03/17 1342  WBC 9.2  --   RBC 5.21*  --   HGB 14.3 16.3*  HCT 47.4* 48.0*  MCV 91.0  --   MCH 27.4  --   MCHC 30.2  --   RDW 18.3*  --   PLT 259  --    Cardiac EnzymesNo results for input(s): TROPONINI in the last 168 hours.  Recent Labs  Lab 04/03/17 1339  TROPIPOC 0.01    BNP Recent Labs  Lab 04/03/17 1331  BNP 90.0    DDimer No results for input(s): DDIMER in the last 168 hours.  Radiology/Studies:  Dg Chest 2 View  Result Date: 04/03/2017 CLINICAL DATA:  Congestion, shortness of breath, coughing EXAM: CHEST  2 VIEW COMPARISON:  None FINDINGS: Diffuse bilateral interstitial thickening. No pleural effusion or pneumothorax. Stable cardiomegaly. No acute osseous abnormality. IMPRESSION: Findings concerning for mild CHF. Electronically Signed   By: Elige Ko   On: 04/03/2017 14:22   Ct Angio Chest Pe W And/or Wo Contrast  Result Date: 04/03/2017 CLINICAL DATA:  Shortness  of breath and wheezing EXAM: CT ANGIOGRAPHY CHEST WITH CONTRAST TECHNIQUE: Multidetector CT imaging of the chest was performed using the standard protocol during bolus administration of intravenous contrast. Multiplanar CT image reconstructions and MIPs were obtained to evaluate the vascular anatomy. CONTRAST:  100mL ISOVUE-370 IOPAMIDOL (ISOVUE-370) INJECTION 76% COMPARISON:  Chest radiograph April 03, 2017 FINDINGS: Cardiovascular: There is no evident pulmonary  embolus. There is no thoracic aortic aneurysm or dissection. The visualized great vessels appear unremarkable. There is a small pericardial effusion. There is enlargement of the main pulmonary outflow tract measuring 3.9 cm in diameter consistent with pulmonary arterial hypertension. Mediastinum/Nodes: Thyroid appears unremarkable. There is no appreciable thoracic adenopathy. No esophageal lesions are evident. Lungs/Pleura: There is patchy atelectatic change throughout both lower lobes. There is milder atelectatic change in the left upper lobe. There is mild interstitial edema. There is no airspace consolidation. There is no appreciable pleural effusion or pleural thickening. Upper Abdomen: There is reflux of contrast into the inferior vena cava and hepatic veins. Visualized upper abdominal structures otherwise appear unremarkable. Musculoskeletal: There is degenerative change in the thoracic spine. There are no blastic or lytic bone lesions. No Review of the MIP images confirms the above findings. IMPRESSION: 1. No demonstrable pulmonary embolus. No thoracic aortic aneurysm or dissection. 2. Mild interstitial pulmonary edema. Scattered areas of atelectatic change. No airspace consolidation. No appreciable pleural effusion. 3.  Small pericardial effusion. 4.  No demonstrable thoracic adenopathy. 5. Reflux of contrast into the inferior vena cava and hepatic veins, a finding that may be indicating a degree of increase in right heart pressure. Electronically Signed   By: Bretta BangWilliam  Woodruff III M.D.   On: 04/03/2017 14:42    Assessment and Plan:   1. CHF --Diastolic HF acute.  Echo with preserved EF  BNP is normal but can be low particularly in obese pateints.   On lasix 40 IV BID.   Now neg 2477, unsure with wt.  Continue to diuresis.    Should be evaluated for sleep apnea.   2.    Pulmonary HTN on CTA   .  Likely some component of hypoventilation obesity syndreom.   3.    Morbid obesity - wt reduction  discussed -possible bariatric surgery - [name] prefers not to undergo or weight loss management through hospital  4.      HLD controlled on Crestor.    5.      DM-2 on metformin,   6.      HTN on HCTZ   BP 135/78 to 156/60 today,  On outpt visits is mostly in 120s to 130s. Systolic.   7.      Pericardial Effusion small on echo.  No further therapy.    For questions or updates, please contact [facility] Please consult [email or web address] for contact info under Cardiology/STEMI.   Addendum  Echo  Study Conclusions  - Left ventricle: The cavity size was normal. There was mild   concentric hypertrophy. Systolic function was normal. The   estimated ejection fraction was in the range of 60% to 65%. Wall   motion was normal; there were no regional wall motion   abnormalities. Features are consistent with a pseudonormal left   ventricular filling pattern, with concomitant abnormal relaxation   and increased filling pressure (grade 2 diastolic dysfunction). - Left atrium: The atrium was mildly dilated. - Right ventricle: The cavity size was moderately dilated. Systolic   function was mildly reduced. - Right atrium: The atrium was mildly dilated. -  Pericardium, extracardiac: A small, free-flowing pericardial   effusion was identified circumferential to the heart.  Impressions:  - PA pressure could not be estimated, but there is indirect   evidence of pulmonary hypertension. Although there is evidence   suggesting elevated left heart filling pressure, the dominant   pathophysiology appears to be right heart failure.   Signed, Nada Boozer, NP  04/04/2017 9:41 AM   History and all data above reviewed.  [name] examined.  I agree with the findings as above.   The [name] reports about two weeks of increased dyspnea with cough and thick sputum.  [name] has gained about 20 lbs but [name] did not really notice this.  The [name] denies any new symptoms such as chest discomfort, neck or arm  discomfort. There has been no new PND or orthopnea. There have been no reported palpitations, presyncope or syncope.The [name] exam reveals COR:RRR  ,  Lungs: Clear  ,  Abd: Positive bowel sounds, no rebound no guarding, Ext Moderate edema of the legs  .  All available labs, radiology testing, previous records reviewed. Agree with documented assessment and plan.   Dyspnea:  This is multifactorial.  There could be some component of diastolic dysfunction but I think this is a secondary concern that needs to be treated for the most part with salt and fluid restriction, weight loss and a low dose of PO diuretic at home.  There seems to be some mild pulmonary HTN and there is likely to be some component of hypoventilation obesity syndrome.  I agree with sleep study for sleep apnea and follow up with [name] PCP for this.  I do not think that ischemia work up or further cardiovascular testing is indicated at this point.      Rollene Rotunda  3:39 PM  04/04/2017

## 2017-04-04 NOTE — Progress Notes (Signed)
  Echocardiogram 2D Echocardiogram has been performed.  Celene SkeenVijay  Stevey Stapleton 04/04/2017, 10:17 AM

## 2017-04-05 ENCOUNTER — Inpatient Hospital Stay (HOSPITAL_COMMUNITY): Payer: BC Managed Care – PPO

## 2017-04-05 DIAGNOSIS — I2781 Cor pulmonale (chronic): Secondary | ICD-10-CM

## 2017-04-05 DIAGNOSIS — J9601 Acute respiratory failure with hypoxia: Secondary | ICD-10-CM

## 2017-04-05 DIAGNOSIS — I5033 Acute on chronic diastolic (congestive) heart failure: Secondary | ICD-10-CM

## 2017-04-05 DIAGNOSIS — J810 Acute pulmonary edema: Secondary | ICD-10-CM

## 2017-04-05 DIAGNOSIS — I10 Essential (primary) hypertension: Secondary | ICD-10-CM

## 2017-04-05 DIAGNOSIS — I509 Heart failure, unspecified: Secondary | ICD-10-CM

## 2017-04-05 LAB — CBC WITH DIFFERENTIAL/PLATELET
Basophils Absolute: 0 10*3/uL (ref 0.0–0.1)
Basophils Relative: 0 %
EOS PCT: 2 %
Eosinophils Absolute: 0.2 10*3/uL (ref 0.0–0.7)
HCT: 48 % — ABNORMAL HIGH (ref 36.0–46.0)
HEMOGLOBIN: 14.1 g/dL (ref 12.0–15.0)
Lymphocytes Relative: 15 %
Lymphs Abs: 1.6 10*3/uL (ref 0.7–4.0)
MCH: 27.3 pg (ref 26.0–34.0)
MCHC: 29.4 g/dL — AB (ref 30.0–36.0)
MCV: 93 fL (ref 78.0–100.0)
MONO ABS: 0.7 10*3/uL (ref 0.1–1.0)
Monocytes Relative: 6 %
NEUTROS ABS: 8.1 10*3/uL — AB (ref 1.7–7.7)
Neutrophils Relative %: 77 %
Platelets: 265 10*3/uL (ref 150–400)
RBC: 5.16 MIL/uL — ABNORMAL HIGH (ref 3.87–5.11)
RDW: 18.3 % — ABNORMAL HIGH (ref 11.5–15.5)
WBC: 10.6 10*3/uL — AB (ref 4.0–10.5)

## 2017-04-05 LAB — BASIC METABOLIC PANEL
ANION GAP: 12 (ref 5–15)
BUN: 12 mg/dL (ref 6–20)
CALCIUM: 8.6 mg/dL — AB (ref 8.9–10.3)
CO2: 36 mmol/L — ABNORMAL HIGH (ref 22–32)
Chloride: 89 mmol/L — ABNORMAL LOW (ref 101–111)
Creatinine, Ser: 0.66 mg/dL (ref 0.44–1.00)
GFR calc Af Amer: >60 mL/min (ref >60)
GFR calc non Af Amer: >60 mL/min (ref >60)
GLUCOSE: 139 mg/dL — AB (ref 65–99)
POTASSIUM: 3.6 mmol/L (ref 3.5–5.1)
SODIUM: 137 mmol/L (ref 135–145)

## 2017-04-05 LAB — GLUCOSE, CAPILLARY
GLUCOSE-CAPILLARY: 132 mg/dL — AB (ref 65–99)
Glucose-Capillary: 127 mg/dL — ABNORMAL HIGH (ref 65–99)

## 2017-04-05 LAB — MAGNESIUM: MAGNESIUM: 1.9 mg/dL (ref 1.7–2.4)

## 2017-04-05 MED ORDER — FUROSEMIDE 40 MG PO TABS: 40.0000 mg | ORAL_TABLET | Freq: Every day | ORAL | 0 refills | Status: DC

## 2017-04-05 MED ORDER — POTASSIUM CHLORIDE ER 10 MEQ PO TBCR: 10.0000 meq | EXTENDED_RELEASE_TABLET | Freq: Every day | ORAL | 0 refills | Status: DC

## 2017-04-05 MED ORDER — ASPIRIN 81 MG PO TBEC: 81.0000 mg | DELAYED_RELEASE_TABLET | Freq: Every day | ORAL | 0 refills | Status: DC

## 2017-04-05 NOTE — Discharge Summary (Signed)
Physician Discharge Summary  [name] ZOX:[id number] DOB: 1966/01/28 DOA: 04/03/2017  PCP: Terressa Koyanagi, DO  Admit date: 04/03/2017 Discharge date: 04/05/2017  Admitted From:Home Disposition: Home  Discharge Condition:Stable CODE STATUS:Full Diet recommendation: Heart Healthy  Brief/Interim Summary: Patient is a 52 year old female with history of morbid obesity, hypertension, diabetes presented with shortness of breath and cough.  She was admitted with new onset congestive heart failure and started on intravenous Lasix. Patient was continued on IV diuresis.  Cardiology evaluated the patient. Echocardiogram was done which shows ejection fraction of 60-65%,moderately dilated right ventricle, mildly reduced right ventricular systolic function suggesting grade 2 diastolic dysfunction. Patient's overall status improved.  She is stable to be discharged home today.  She will follow-up with her PCP in a week.  Following problems were addressed during her hospitalization:   Acute decompensated diastolic heart failure: -Patient has been started on intravenous Lasix. Net output of more than 9 L since admission -Cardiology consulted -Echo findings as above -Repeat chest x-ray this morning just showed mild interstitial edema. -Patient symptomatically feels much better. -She will be discharged on Lasix 40 mg daily.  Hydrochlorothiazide will be stopped.  She will follow-up with her PCP in a week and hopefully she can be started on low-dose ARB.  Acute hypoxic respiratory failure -Probably secondary to above.   -CT chest was negative for PE -Influenza negative -Patient probably also has undiagnosed sleep apnea or obesity hypoventilation syndrome. She  needs outpatient sleep study. -This morning oxygenation was checked without supplemental oxygen.  She is saturating fine on room air Saturation on ambulation is also acceptable.  Morbid obesity -Outpatient follow-up  Chronic  venous insufficiency -Outpatient follow-up  Diabetes mellitus type II -Resume her home regimen. hemoglobin A1c is 7.7   Discharge Diagnoses:  Principal Problem:   CHF (congestive heart failure) (HCC) Active Problems:   Morbid obesity (HCC)   Chronic venous insufficiency   Diabetes (HCC)   Acute respiratory failure with hypoxia [facility])    Discharge Instructions  Discharge Instructions    Diet - low sodium heart healthy   Complete by:  As directed    Discharge instructions   Complete by:  As directed    1) Please follow-up with your PCP in a week. 2) Follow up with cardiology in 4 weeks.  Name and number of the provider has been attached. 3) Take less than 2 g of salt and less than 1.5 Litres of fluid a day. 4) Take prescribed medication as instructed. 5)Do an outpatient sleep study test to rule out sleep apnea.   Increase activity slowly   Complete by:  As directed      Allergies as of 04/05/2017   No Known Allergies     Medication List    STOP taking these medications   hydrochlorothiazide 25 MG tablet Commonly known as:  HYDRODIURIL     TAKE these medications   acetaminophen 500 MG tablet Commonly known as:  TYLENOL Take 1,000 mg by mouth daily as needed (PAIN).   ALKA-SELTZER PLUS COLD PO Take 2 tablets by mouth daily as needed (HEAD CONGESTION, COUGH).   aspirin 81 MG EC tablet Take 1 tablet (81 mg total) by mouth daily. Start taking on:  04/06/2017   dextromethorphan-guaiFENesin 30-600 MG 12hr tablet Commonly known as:  MUCINEX DM Take 1 tablet by mouth at bedtime as needed for cough.   fluticasone 50 MCG/ACT nasal spray Commonly known as:  FLONASE Place 1 spray into both nostrils 2 (two) times daily.  furosemide 40 MG tablet Commonly known as:  LASIX Take 1 tablet (40 mg total) by mouth daily.   ibuprofen 200 MG tablet Commonly known as:  ADVIL,MOTRIN Take 400-600 mg by mouth daily as needed (pain).   levonorgestrel 20 MCG/24HR IUD Commonly  known as:  MIRENA 1 each by Intrauterine route once.   metFORMIN 1000 MG tablet Commonly known as:  GLUCOPHAGE TAKE 1 TABLET (1,000 MG TOTAL) BY MOUTH 2 (TWO) TIMES DAILY WITH A MEAL.   naproxen sodium 220 MG tablet Commonly known as:  ALEVE Take 220 mg by mouth at bedtime as needed (KNEE PAIN).   potassium chloride 10 MEQ tablet Commonly known as:  K-DUR Take 1 tablet (10 mEq total) by mouth daily.   rosuvastatin 5 MG tablet Commonly known as:  CRESTOR TAKE 1 TABLET (5 MG TOTAL) BY MOUTH DAILY.   VITAMIN C PO Take by mouth.   VITAMIN D3 PO Take by mouth.   WOMENS DAILY FORMULA PO Take 1 tablet by mouth daily.      Follow-up Information    Terressa Koyanagi, DO. Schedule an appointment as soon as possible for a visit in 1 week(s).   Specialty:  Family Medicine Contact information: [address] [place] [address] [phone]        Rollene Rotunda, MD. Schedule an appointment as soon as possible for a visit in 4 week(s).   Specialty:  Cardiology Contact information: [address] [place] [address] [phone]          No Known Allergies  Consultations:  Cardiology   Procedures/Studies: Dg Chest 2 View  Result Date: 04/05/2017 CLINICAL DATA:  Cough and shortness of breath. EXAM: CHEST  2 VIEW COMPARISON:  Chest x-ray and CT chest dated April 03, 2017. FINDINGS: Stable cardiomegaly. Unchanged mild interstitial edema. Bibasilar atelectasis. No focal consolidation, pleural effusion, or pneumothorax. No acute osseous abnormality. IMPRESSION: Stable mild interstitial pulmonary edema. Electronically Signed   By: Obie Dredge M.D.   On: 04/05/2017 09:17   Dg Chest 2 View  Result Date: 04/03/2017 CLINICAL DATA:  Congestion, shortness of breath, coughing EXAM: CHEST  2 VIEW COMPARISON:  None FINDINGS: Diffuse bilateral interstitial thickening. No pleural effusion or pneumothorax. Stable cardiomegaly. No acute osseous abnormality.  IMPRESSION: Findings concerning for mild CHF. Electronically Signed   By: Elige Ko   On: 04/03/2017 14:22   Ct Angio Chest Pe W And/or Wo Contrast  Result Date: 04/03/2017 CLINICAL DATA:  Shortness of breath and wheezing EXAM: CT ANGIOGRAPHY CHEST WITH CONTRAST TECHNIQUE: Multidetector CT imaging of the chest was performed using the standard protocol during bolus administration of intravenous contrast. Multiplanar CT image reconstructions and MIPs were obtained to evaluate the vascular anatomy. CONTRAST:  ISOVUE-370 IOPAMIDOL (ISOVUE-370) INJECTION 76% COMPARISON:  Chest radiograph April 03, 2017 FINDINGS: Cardiovascular: There is no evident pulmonary embolus. There is no thoracic aortic aneurysm or dissection. The visualized great vessels appear unremarkable. There is a small pericardial effusion. There is enlargement of the main pulmonary outflow tract measuring 3.9 cm in diameter consistent with pulmonary arterial hypertension. Mediastinum/Nodes: Thyroid appears unremarkable. There is no appreciable thoracic adenopathy. No esophageal lesions are evident. Lungs/Pleura: There is patchy atelectatic change throughout both lower lobes. There is milder atelectatic change in the left upper lobe. There is mild interstitial edema. There is no airspace consolidation. There is no appreciable pleural effusion or pleural thickening. Upper Abdomen: There is reflux of contrast into the inferior vena cava and hepatic veins.  Visualized upper abdominal structures otherwise appear unremarkable. Musculoskeletal: There is degenerative change in the thoracic spine. There are no blastic or lytic bone lesions. No Review of the MIP images confirms the above findings. IMPRESSION: 1. No demonstrable pulmonary embolus. No thoracic aortic aneurysm or dissection. 2. Mild interstitial pulmonary edema. Scattered areas of atelectatic change. No airspace consolidation. No appreciable pleural effusion. 3.  Small pericardial  effusion. 4.  No demonstrable thoracic adenopathy. 5. Reflux of contrast into the inferior vena cava and hepatic veins, a finding that may be indicating a degree of increase in right heart pressure. Electronically Signed   By: Bretta Bang III M.D.   On: 04/03/2017 14:42   (Echo, Carotid, EGD, Colonoscopy, ERCP)    Subjective:   Discharge Exam: Vitals:   04/04/17 2055 04/05/17 0457  BP: 136/70 (!) 157/64  Pulse: 69 71  Resp: 18 20  Temp: 98.2 F (36.8 C) 98 F (36.7 C)  SpO2: 96% 95%   Vitals:   04/04/17 1200 04/04/17 1350 04/04/17 2055 04/05/17 0457  BP:  120/69 136/70 (!) 157/64  Pulse:  64 69 71  Resp:  16 18 20   Temp:  98.2 F (36.8 C) 98.2 F (36.8 C) 98 F (36.7 C)  TempSrc:  Oral Oral Oral  SpO2: 94% 93% 96% 95%  Weight:    (!) 182.3 kg (402 lb)  Height:        General: Pt is alert, awake, not in acute distress, morbidly obese Cardiovascular: RRR, S1/S2 +, no rubs, no gallops Respiratory: CTA bilaterally, no wheezing, no rhonchi Abdominal: Soft, NT, ND, bowel sounds + Extremities: Trace edema, no cyanosis    The results of significant diagnostics from this hospitalization (including imaging, microbiology, ancillary and laboratory) are listed below for reference.     Microbiology: No results found for this or any previous visit (from the past 240 hour(s)).   Labs: BNP (last 3 results) Recent Labs    04/03/17 1331  BNP 90.0   Basic Metabolic Panel: Recent Labs  Lab 04/03/17 1331 04/03/17 1342 04/04/17 0409 04/05/17 0443  NA 138 138 138 137  K 3.7 3.7 3.6 3.6  CL 95* 93* 92* 89*  CO2 30  --  34* 36*  GLUCOSE 133* 133* 149* 139*  BUN 14 14 14 12   CREATININE 0.56 0.60 0.66 0.66  CALCIUM 8.7*  --  8.7* 8.6*  MG  --   --   --  1.9   Liver Function Tests: Recent Labs  Lab 04/03/17 1331  AST 36  ALT 43  ALKPHOS 97  BILITOT 0.9  PROT 6.8  ALBUMIN 3.4*   No results for input(s): LIPASE, AMYLASE in the last 168 hours. No results for  input(s): AMMONIA in the last 168 hours. CBC: Recent Labs  Lab 04/03/17 1331 04/03/17 1342 04/05/17 0443  WBC 9.2  --  10.6*  NEUTROABS 7.2  --  8.1*  HGB 14.3 16.3* 14.1  HCT 47.4* 48.0* 48.0*  MCV 91.0  --  93.0  PLT 259  --  265   Cardiac Enzymes: No results for input(s): CKTOTAL, CKMB, CKMBINDEX, TROPONINI in the last 168 hours. BNP: Invalid input(s): POCBNP CBG: Recent Labs  Lab 04/04/17 0746 04/04/17 1110 04/04/17 1617 04/04/17 2102 04/05/17 0742  GLUCAP 144* 156* 111* 138* 127*   D-Dimer No results for input(s): DDIMER in the last 72 hours. Hgb A1c Recent Labs    04/03/17 1929  HGBA1C 7.7*   Lipid Profile No results for input(s): CHOL, HDL,  LDLCALC, TRIG, CHOLHDL, LDLDIRECT in the last 72 hours. Thyroid function studies No results for input(s): TSH, T4TOTAL, T3FREE, THYROIDAB in the last 72 hours.  Invalid input(s): FREET3 Anemia work up No results for input(s): VITAMINB12, FOLATE, FERRITIN, TIBC, IRON, RETICCTPCT in the last 72 hours. Urinalysis    Component Value Date/Time   COLORURINE YELLOW 04/03/2017 1331   APPEARANCEUR CLEAR 04/03/2017 1331   LABSPEC 1.024 04/03/2017 1331   PHURINE 7.0 04/03/2017 1331   GLUCOSEU NEGATIVE 04/03/2017 1331   HGBUR NEGATIVE 04/03/2017 1331   BILIRUBINUR NEGATIVE 04/03/2017 1331   KETONESUR NEGATIVE 04/03/2017 1331   PROTEINUR 30 (A) 04/03/2017 1331   UROBILINOGEN 0.2 12/15/2013 1115   NITRITE NEGATIVE 04/03/2017 1331   LEUKOCYTESUR NEGATIVE 04/03/2017 1331   Sepsis Labs Invalid input(s): PROCALCITONIN,  WBC,  LACTICIDVEN Microbiology No results found for this or any previous visit (from the past 240 hour(s)).   Time coordinating discharge: Over 30 minutes  SIGNED:   Meredith LeedsAmrit Johannes Everage BK, MD  [facility] 04/05/2017, 11:06 AM Pager [phone]  If 7PM-7AM, please contact night-coverage [email or web address] Password [email or web address]

## 2017-04-05 NOTE — Progress Notes (Signed)
Patient down for CXR with NT.

## 2017-04-05 NOTE — Progress Notes (Signed)
Progress Note  Patient Name: [name] Date of Encounter: 04/05/2017  Primary Cardiologist: Dr. Rollene Rotunda  Subjective   Sitting in chair this morning.  Feels better, intermittent cough that is mildly productive.  No chest pain or breathlessness at rest.  Inpatient Medications    Scheduled Meds: . aspirin EC  81 mg Oral Daily  . fluticasone  1 spray Each Nare BID  . furosemide  40 mg Intravenous BID  . hydrochlorothiazide  25 mg Oral Daily  . insulin aspart  0-9 Units Subcutaneous TID WC  . rosuvastatin  5 mg Oral Daily  . sodium chloride flush  3 mL Intravenous Q12H   Continuous Infusions: . sodium chloride     PRN Meds: sodium chloride, acetaminophen, dextromethorphan-guaiFENesin, ibuprofen, ondansetron (ZOFRAN) IV, sodium chloride flush   Vital Signs    Vitals:   04/04/17 1200 04/04/17 1350 04/04/17 2055 04/05/17 0457  BP:  120/69 136/70 (!) 157/64  Pulse:  64 69 71  Resp:  16 18 20   Temp:  98.2 F (36.8 C) 98.2 F (36.8 C) 98 F (36.7 C)  TempSrc:  Oral Oral Oral  SpO2: 94% 93% 96% 95%  Weight:    (!) 402 lb (182.3 kg)  Height:        Intake/Output Summary (Last 24 hours) at 04/05/2017 0801 Last data filed at 04/05/2017 1610 Gross per 24 hour  Intake 843 ml  Output 7250 ml  Net -6407 ml   Filed Weights   04/03/17 1652 04/04/17 0459 04/05/17 0457  Weight: (!) 384 lb (174.2 kg) (!) 409 lb 9.6 oz (185.8 kg) (!) 402 lb (182.3 kg)    Telemetry    Sinus rhythm.  Personally reviewed.  ECG    Tracing from 04/03/2017 showed sinus rhythm with rightward axis.  Personally reviewed.  Physical Exam   GEN:  Morbidly obese woman.  No acute distress.   Neck: No JVD. Cardiac: RRR, no gallop.  Respiratory: Nonlabored. Clear to auscultation bilaterally. GI:  Obese with pannus, bowel sounds present. MS:  Improving leg edema; No deformity. Neuro:  Nonfocal.  Psych: Alert and oriented x 3. Normal affect.  Labs    Chemistry Recent Labs  Lab  04/03/17 1331 04/03/17 1342 04/04/17 0409 04/05/17 0443  NA 138 138 138 137  K 3.7 3.7 3.6 3.6  CL 95* 93* 92* 89*  CO2 30  --  34* 36*  GLUCOSE 133* 133* 149* 139*  BUN 14 14 14 12   CREATININE 0.56 0.60 0.66 0.66  CALCIUM 8.7*  --  8.7* 8.6*  PROT 6.8  --   --   --   ALBUMIN 3.4*  --   --   --   AST 36  --   --   --   ALT 43  --   --   --   ALKPHOS 97  --   --   --   BILITOT 0.9  --   --   --   GFRNONAA >60  --  >60 >60  GFRAA >60  --  >60 >60  ANIONGAP 13  --  12 12     Hematology Recent Labs  Lab 04/03/17 1331 04/03/17 1342 04/05/17 0443  WBC 9.2  --  10.6*  RBC 5.21*  --  5.16*  HGB 14.3 16.3* 14.1  HCT 47.4* 48.0* 48.0*  MCV 91.0  --  93.0  MCH 27.4  --  27.3  MCHC 30.2  --  29.4*  RDW 18.3*  --  18.3*  PLT 259  --  265    Cardiac EnzymesNo results for input(s): TROPONINI in the last 168 hours.  Recent Labs  Lab 04/03/17 1339  TROPIPOC 0.01     BNP Recent Labs  Lab 04/03/17 1331  BNP 90.0     Radiology    Dg Chest 2 View  Result Date: 04/03/2017 CLINICAL DATA:  Congestion, shortness of breath, coughing EXAM: CHEST  2 VIEW COMPARISON:  None FINDINGS: Diffuse bilateral interstitial thickening. No pleural effusion or pneumothorax. Stable cardiomegaly. No acute osseous abnormality. IMPRESSION: Findings concerning for mild CHF. Electronically Signed   By: Elige Ko   On: 04/03/2017 14:22   Ct Angio Chest Pe W And/or Wo Contrast  Result Date: 04/03/2017 CLINICAL DATA:  Shortness of breath and wheezing EXAM: CT ANGIOGRAPHY CHEST WITH CONTRAST TECHNIQUE: Multidetector CT imaging of the chest was performed using the standard protocol during bolus administration of intravenous contrast. Multiplanar CT image reconstructions and MIPs were obtained to evaluate the vascular anatomy. CONTRAST:  ISOVUE-370 IOPAMIDOL (ISOVUE-370) INJECTION 76% COMPARISON:  Chest radiograph April 03, 2017 FINDINGS: Cardiovascular: There is no evident pulmonary embolus.  There is no thoracic aortic aneurysm or dissection. The visualized great vessels appear unremarkable. There is a small pericardial effusion. There is enlargement of the main pulmonary outflow tract measuring 3.9 cm in diameter consistent with pulmonary arterial hypertension. Mediastinum/Nodes: Thyroid appears unremarkable. There is no appreciable thoracic adenopathy. No esophageal lesions are evident. Lungs/Pleura: There is patchy atelectatic change throughout both lower lobes. There is milder atelectatic change in the left upper lobe. There is mild interstitial edema. There is no airspace consolidation. There is no appreciable pleural effusion or pleural thickening. Upper Abdomen: There is reflux of contrast into the inferior vena cava and hepatic veins. Visualized upper abdominal structures otherwise appear unremarkable. Musculoskeletal: There is degenerative change in the thoracic spine. There are no blastic or lytic bone lesions. No Review of the MIP images confirms the above findings. IMPRESSION: 1. No demonstrable pulmonary embolus. No thoracic aortic aneurysm or dissection. 2. Mild interstitial pulmonary edema. Scattered areas of atelectatic change. No airspace consolidation. No appreciable pleural effusion. 3.  Small pericardial effusion. 4.  No demonstrable thoracic adenopathy. 5. Reflux of contrast into the inferior vena cava and hepatic veins, a finding that may be indicating a degree of increase in right heart pressure. Electronically Signed   By: Bretta Bang III M.D.   On: 04/03/2017 14:42    Cardiac Studies   Echocardiogram 04/04/2017: Study Conclusions  - Left ventricle: The cavity size was normal. There was mild   concentric hypertrophy. Systolic function was normal. The   estimated ejection fraction was in the range of 60% to 65%. Wall   motion was normal; there were no regional wall motion   abnormalities. Features are consistent with a pseudonormal left   ventricular filling  pattern, with concomitant abnormal relaxation   and increased filling pressure (grade 2 diastolic dysfunction). - Left atrium: The atrium was mildly dilated. - Right ventricle: The cavity size was moderately dilated. Systolic   function was mildly reduced. - Right atrium: The atrium was mildly dilated. - Pericardium, extracardiac: A small, free-flowing pericardial   effusion was identified circumferential to the heart.  Impressions:  - PA pressure could not be estimated, but there is indirect   evidence of pulmonary hypertension. Although there is evidence   suggesting elevated left heart filling pressure, the dominant   pathophysiology appears to be right heart failure.  Patient  Profile     52 y.o. female with morbid obesity, hypertension, type 2 diabetes mellitus, now presenting with URI and hypoxic respiratory failure, possible component of diastolic heart failure as well.  Assessment & Plan    1.  Diastolic heart failure as well as right ventricular dysfunction with indirect evidence of elevated pulmonary pressures.  She has diuresed well on IV Lasix, approximately 9000 cc net out more than in last 48 hours.  2.  Essential hypertension, on HCTZ as an outpatient.  3.  Small pericardial effusion by echocardiogram, not hemodynamically significant.  4.  Morbid obesity.  5.  At risk for obstructive sleep apnea and hypoventilation syndrome.  Discussed echocardiogram results with the patient.  She is feeling better, anticipate discharge home soon if she does not remain hypoxic off oxygen supplementation, would suggest ambulatory oxygen saturations.  When ready for discharge per primary team, suggest change Lasix to 40 mg daily with KCl 10 mEq daily, stop outpatient HCTZ for now.  Could then be considered for initiation of low-dose ARB as an outpatient by PCP with close follow-up (should see PCP 1-2 weeks with BMET).  She also needs to have evaluation for sleep apnea.  No further  inpatient cardiac testing is planned.  We will sign off.  Signed, Nona DellSamuel Evolette Pendell, MD  04/05/2017, 8:01 AM

## 2017-04-05 NOTE — Progress Notes (Signed)
Patient oxygen saturation was as following:  100% on RA at rest. Dropped to 88% at lowest on RA during ambulation with moderate exertion.  Patient was able to quickly recover to 96% on RA when back to rest.  Patient states she did not feel short of breath.  Dr. Renford DillsAdhikari aware. Will continue to monitor.

## 2017-04-07 ENCOUNTER — Telehealth: Payer: Self-pay | Admitting: *Deleted

## 2017-04-07 NOTE — Telephone Encounter (Signed)
Patient is on the schedule for 04/10/2017.

## 2017-04-07 NOTE — Telephone Encounter (Signed)
2 spots can be blocked for a hospital follow up. Ronnald CollumJo Anne    Copied from CRM [id number]. Topic: General - Other >> Apr 07, 2017  7:41 AM Gerrianne ScalePayne, Angela L wrote: Reason for CRM: patient was released from the hospital on Saturday and need a Hospital F/u with DR Selena BattenKim I didn't see anything available  >> Apr 07, 2017  8:59 AM Nimmons, Amie CritchleySylvia A, RN wrote: Any suggestions on scheduling this appointment? Schedule appears full.

## 2017-04-07 NOTE — Telephone Encounter (Signed)
I left a voice message for pt to return our call to get on the schedule to see Dr. Selena BattenKim for hospital follow up.

## 2017-04-10 ENCOUNTER — Ambulatory Visit: Payer: BC Managed Care – PPO | Admitting: Family Medicine

## 2017-04-10 ENCOUNTER — Encounter: Payer: Self-pay | Admitting: Family Medicine

## 2017-04-10 VITALS — BP 120/82 | HR 74 | Temp 98.9°F | Wt 388.0 lb

## 2017-04-10 DIAGNOSIS — I10 Essential (primary) hypertension: Secondary | ICD-10-CM | POA: Diagnosis not present

## 2017-04-10 DIAGNOSIS — E119 Type 2 diabetes mellitus without complications: Secondary | ICD-10-CM | POA: Diagnosis not present

## 2017-04-10 DIAGNOSIS — E1159 Type 2 diabetes mellitus with other circulatory complications: Secondary | ICD-10-CM | POA: Diagnosis not present

## 2017-04-10 DIAGNOSIS — I509 Heart failure, unspecified: Secondary | ICD-10-CM | POA: Diagnosis not present

## 2017-04-10 DIAGNOSIS — I152 Hypertension secondary to endocrine disorders: Secondary | ICD-10-CM

## 2017-04-10 DIAGNOSIS — J9601 Acute respiratory failure with hypoxia: Secondary | ICD-10-CM

## 2017-04-10 MED ORDER — LOSARTAN POTASSIUM 50 MG PO TABS: 50.0000 mg | ORAL_TABLET | Freq: Every day | ORAL | 3 refills | Status: DC

## 2017-04-10 NOTE — Progress Notes (Signed)
HPI:  [name] is a pleasant 52 year old with a past medical history significant for morbid obesity, hypertension, hyperlipidemia, venous insufficiency and diabetes here for hospital follow-up for CHF. She was hospitalized 2/21 to 04/05/2017 for presenting symptom of shortness of breath and hypoxia.  She was found to have new onset heart failure and was treated with IV diuresis.  Cardiology did see her in the hospital and will be following up with her outpatient as well.  Workup for pulmonary embolism was negative.  Echocardiogram showed moderate dilation of the right ventricle and grade 2 diastolic dysfunction per discharge report.  Per discharge document she had 9 L of output with IV diuresis.  She was discharged on Lasix 40 mg daily.  Diabetes lab was up in the hospital at 7.7.  She is on aspirin, Lasix, metformin, Crestor and potassium.  She is due for a physical in a number of health maintenance measures.  She reports she has been feeling well since her discharge.  Denies any shortness of breath, chest pain or wheezing.  She has an occasional cough.  She feels like she had an upper respiratory infection with the illness as well.  This is mostly resolved.  Continue the Lasix and the potassium.  Reports she is going to set up her cardiology appointment for follow-up.  She plans to see her gynecologist for an annual exam.  Prefers to work on dietary changes for the diabetes.  Reports she has made significant changes and really thinks that her diabetes will improve on the next check.   ROS: See pertinent positives and negatives per HPI.  Past Medical History:  Diagnosis Date  . Allergy   . Diabetes (HCC)   . GERD (gastroesophageal reflux disease)   . Heart murmur 08/28/2015  . Hyperlipemia   . Hypertension   . Obesity   . Venous insufficiency     Past Surgical History:  Procedure Laterality Date  . Birth Loraine Leriche removed    . CHOLECYSTECTOMY    . INTRAUTERINE DEVICE INSERTION   12/31/2013   Mirena    Family History  Problem Relation Age of Onset  . Hypertension Father   . Diabetes Father   . Cancer Paternal Grandfather        Stomach  . Cancer Maternal Uncle        Pancreatic  . Cancer Paternal Uncle        Kidney  . Hypertension Mother     Social History   Socioeconomic History  . Marital status: Married    Spouse name: None  . Number of children: None  . Years of education: None  . Highest education level: None  Social Needs  . Financial resource strain: None  . Food insecurity - worry: None  . Food insecurity - inability: None  . Transportation needs - medical: None  . Transportation needs - non-medical: None  Occupational History  . None  Tobacco Use  . Smoking status: Never Smoker  . Smokeless tobacco: Never Used  Substance and Sexual Activity  . Alcohol use: Yes    Alcohol/week: 0.0 oz    Comment: Rare  . Drug use: No  . Sexual activity: Yes    Birth control/protection: IUD    Comment: Mirena 12/31/2013-1st intercourse 52 yo-Fewer than 5 partners  Other Topics Concern  . 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  acetaminophen (TYLENOL) 500 MG tablet, Take 1,000 mg by mouth daily as needed (PAIN)., Disp: , Rfl:  .  Ascorbic Acid (VITAMIN C PO), Take by mouth., Disp: , Rfl:  .  aspirin EC 81 MG EC tablet, Take 1 tablet (81 mg total) by mouth daily., Disp: 30 tablet, Rfl: 0 .  Chlorphen-Phenyleph-ASA (ALKA-SELTZER PLUS COLD PO), Take 2 tablets by mouth daily as needed (HEAD CONGESTION, COUGH)., Disp: , Rfl:  .  Cholecalciferol (VITAMIN D3 PO), Take by mouth., Disp: , Rfl:  .  dextromethorphan-guaiFENesin (MUCINEX DM) 30-600 MG 12hr tablet, Take 1  tablet by mouth at bedtime as needed for cough., Disp: , Rfl:  .  fluticasone (FLONASE) 50 MCG/ACT nasal spray, Place 1 spray into both nostrils 2 (two) times daily. , Disp: , Rfl:  .  furosemide (LASIX) 40 MG tablet, Take 1 tablet (40 mg total) by mouth daily., Disp: 30 tablet, Rfl: 0 .  ibuprofen (ADVIL,MOTRIN) 200 MG tablet, Take 400-600 mg by mouth daily as needed (pain)., Disp: , Rfl:  .  levonorgestrel (MIRENA) 20 MCG/24HR IUD, 1 each by Intrauterine route once., Disp: , Rfl:  .  metFORMIN (GLUCOPHAGE) 1000 MG tablet, TAKE 1 TABLET (1,000 MG TOTAL) BY MOUTH 2 (TWO) TIMES DAILY WITH A MEAL., Disp: 180 tablet, Rfl: 1 .  Multiple Vitamins-Minerals (WOMENS DAILY FORMULA PO), Take 1 tablet by mouth daily., Disp: , Rfl:  .  naproxen sodium (ALEVE) 220 MG tablet, Take 220 mg by mouth at bedtime as needed (KNEE PAIN)., Disp: , Rfl:  .  potassium chloride (K-DUR) 10 MEQ tablet, Take 1 tablet (10 mEq total) by mouth daily., Disp: 30 tablet, Rfl: 0 .  rosuvastatin (CRESTOR) 5 MG tablet, TAKE 1 TABLET (5 MG TOTAL) BY MOUTH DAILY., Disp: 90 tablet, Rfl: 1 .  losartan (COZAAR) 50 MG tablet, Take 1 tablet (50 mg total) by mouth daily., Disp: 90 tablet, Rfl: 3  Current Facility-Administered Medications:  .  levonorgestrel (MIRENA) 20 MCG/24HR IUD, , Intrauterine, Once, [name], Timothy P, MD  EXAM:  Vitals:   04/10/17 1613  BP: 120/82  Pulse: 74  Temp: 98.9 F (37.2 C)  SpO2: 93%    Body mass index is 62.62 kg/m.  GENERAL: vitals reviewed and listed above, alert, oriented, appears well hydrated and in no acute distress  HEENT: atraumatic, conjunttiva clear, no obvious abnormalities on inspection of external nose and ears  NECK: no obvious masses on inspection  LUNGS: clear to auscultation bilaterally, no wheezes, rales or rhonchi, good air movement  CV: HRRR, tr peripheral edema  MS: moves all extremities without noticeable abnormality  PSYCH: pleasant and cooperative, no obvious  depression or anxiety  ASSESSMENT AND PLAN:  Discussed the following assessment and plan:  Acute congestive heart failure, unspecified heart failure type (HCC)  Acute respiratory failure with hypoxia (HCC)  Morbid obesity (HCC)  Type 2 diabetes mellitus without complication, without long-term current use of insulin (HCC)  Hypertension associated with diabetes (HCC)  -BMP today -Discussed implications of recent illness, uncontrolled diabetes, morbid obesity, hypertension -Advised lifelong commitment to healthy low sugar, low sodium diet.  Advised lifelong commitment to regular aerobic exercise.  Advised weight reduction with a goal to lose 10-20 pounds over the next 3-6 months.  Discussed other  options for weight management as well. -Discussed treatment options for the diabetes, she prefers not to change the medications for now but to continue to work on the diet, as she feels like she has made significant changes.  We discussed healthy diet choices at length. -Plans to call to set up her cardiology follow-up, we talked about the need for sleep study.  She denies any snoring, apneic spells in sleep or daytime somnolence.  However, I still think she would benefit from testing.  She opted to discuss this with the cardiologist and call us if she needs [place] to order. -Discussed options for the blood pressure, still not quite at goal and she has decided to start a low-dose of losartan. -Patient advised to return or notify a doctor immediately if symptoms worsen or persist or new concerns arise.  Patient Instructions  BEFORE YOU LEAVE: -Labs -follow up: Physical with fasting labs in 2 months  Call tomorrow to set up your appointment with the cardiologist.  Call to set up your annual gynecology exam.  Start the losartan and take once daily.  Continue your other medications.  Continue a low-sodium and healthy low sugar along with regular exercise.  Please shoot for a goal of 10-20 pounds of  weight loss over the next 3-6 months.   We recommend the following healthy lifestyle for LIFE: 1) Small portions. But, make sure to get regular (at least 3 per day), healthy meals and small healthy snacks if needed.  2) Eat a healthy clean diet.   TRY TO EAT: -at least 5-7 servings of low sugar, colorful, and nutrient rich vegetables per day (not corn, potatoes or bananas.) -berries are the best choice if you wish to eat fruit (only eat small amounts if trying to reduce weight)  -lean meets (fish, white meat of chicken or [place]) -vegan proteins for some meals - beans or tofu, whole grains, nuts and seeds -Replace bad fats with good fats - good fats include: fish, nuts and seeds, canola oil, olive oil -small amounts of low fat or non fat dairy -small amounts of100 % whole grains - check the lables -drink plenty of water  AVOID: -SUGAR, sweets, anything with added sugar, corn syrup or sweeteners - must read labels as even foods advertised as "healthy" often are loaded with sugar -if you must have a sweetener, small amounts of stevia may be best -sweetened beverages and artificially sweetened beverages -simple starches (rice, bread, potatoes, pasta, chips, etc - small amounts of 100% whole grains are ok) -red meat, pork, butter -fried foods, fast food, processed food, excessive dairy, eggs and coconut.  3)Get at least 150 minutes of sweaty aerobic exercise per week.  4)Reduce stress - consider counseling, meditation and relaxation to balance other aspects of your life.     Terressa Koyanagi, DO

## 2017-04-10 NOTE — Patient Instructions (Signed)
BEFORE YOU LEAVE: -Labs -follow up: Physical with fasting labs in 2 months  Call tomorrow to set up your appointment with the cardiologist.  Call to set up your annual gynecology exam.  Start the losartan and take once daily.  Continue your other medications.  Continue a low-sodium and healthy low sugar along with regular exercise.  Please shoot for a goal of 10-20 pounds of weight loss over the next 3-6 months.   We recommend the following healthy lifestyle for LIFE: 1) Small portions. But, make sure to get regular (at least 3 per day), healthy meals and small healthy snacks if needed.  2) Eat a healthy clean diet.   TRY TO EAT: -at least 5-7 servings of low sugar, colorful, and nutrient rich vegetables per day (not corn, potatoes or bananas.) -berries are the best choice if you wish to eat fruit (only eat small amounts if trying to reduce weight)  -lean meets (fish, white meat of chicken or [place]) -vegan proteins for some meals - beans or tofu, whole grains, nuts and seeds -Replace bad fats with good fats - good fats include: fish, nuts and seeds, canola oil, olive oil -small amounts of low fat or non fat dairy -small amounts of100 % whole grains - check the lables -drink plenty of water  AVOID: -SUGAR, sweets, anything with added sugar, corn syrup or sweeteners - must read labels as even foods advertised as "healthy" often are loaded with sugar -if you must have a sweetener, small amounts of stevia may be best -sweetened beverages and artificially sweetened beverages -simple starches (rice, bread, potatoes, pasta, chips, etc - small amounts of 100% whole grains are ok) -red meat, pork, butter -fried foods, fast food, processed food, excessive dairy, eggs and coconut.  3)Get at least 150 minutes of sweaty aerobic exercise per week.  4)Reduce stress - consider counseling, meditation and relaxation to balance other aspects of your life.

## 2017-04-11 LAB — BASIC METABOLIC PANEL
BUN: 15 mg/dL (ref 6–23)
CO2: 34 mEq/L — ABNORMAL HIGH (ref 19–32)
CREATININE: 0.66 mg/dL (ref 0.40–1.20)
Calcium: 9.4 mg/dL (ref 8.4–10.5)
Chloride: 97 mEq/L (ref 96–112)
GFR: 100.09 mL/min (ref >60.00)
Glucose, Bld: 115 mg/dL — ABNORMAL HIGH (ref 70–99)
POTASSIUM: 4 meq/L (ref 3.5–5.1)
SODIUM: 141 meq/L (ref 135–145)

## 2017-04-24 ENCOUNTER — Telehealth: Payer: Self-pay | Admitting: Cardiology

## 2017-04-24 NOTE — Telephone Encounter (Signed)
Closed Encounter  °

## 2017-05-02 ENCOUNTER — Encounter: Payer: Self-pay | Admitting: Cardiology

## 2017-05-05 ENCOUNTER — Other Ambulatory Visit: Payer: Self-pay | Admitting: Family Medicine

## 2017-05-05 MED ORDER — ASPIRIN 81 MG PO TBEC: 81.0000 mg | DELAYED_RELEASE_TABLET | Freq: Every day | ORAL | 0 refills | Status: DC

## 2017-05-05 MED ORDER — FUROSEMIDE 40 MG PO TABS: 40.0000 mg | ORAL_TABLET | Freq: Every day | ORAL | 0 refills | Status: DC

## 2017-05-05 NOTE — Telephone Encounter (Signed)
Rx done. 

## 2017-05-05 NOTE — Telephone Encounter (Signed)
Pt called about refill - she is out of medication and would like refilled.  Pt was out of town and could not get to pharm until today.   [facility] [place]

## 2017-05-05 NOTE — Telephone Encounter (Signed)
Left message to call back- per lab note ok to stop Klor- Con (patient to repeat BMP at PCP or cardiologist office end of March) Will have to check on the Lasix and aspirin- no notes on them.

## 2017-05-05 NOTE — Telephone Encounter (Signed)
Could you approve refills since Dr Selena BattenKim is out of the office?  It looks like pt has an appt with cardiology on 4/2.

## 2017-05-05 NOTE — Telephone Encounter (Signed)
Copied from CRM [phone]. Topic: Quick Communication - Rx Refill/Question >> May 05, 2017  9:16 AM Gloriann LoanPayne, Rebel Willcutt L wrote: Medication: furosemide (LASIX) 40 MG tablet (Expired)  aspirin EC 81 MG EC tablet   Klor-Con 10meq     pt want to know if she should still be taken these pt had received these from the hospital  Has the patient contacted their pharmacy? Yes.   (Agent: If no, request that the patient contact the pharmacy for the refill.) Preferred Pharmacy (with phone number or street name):   [id number] - [place], [place] - [address] [phone] (Phone) [phone] (Fax)  Patient is out of these medicines    Agent: Please be advised that RX refills may take up to 3 business days. We ask that you follow-up with your pharmacy.

## 2017-05-05 NOTE — Telephone Encounter (Signed)
OK to refill until then.

## 2017-05-05 NOTE — Telephone Encounter (Signed)
Spoke with patient- she will make sure that she gets her lab drawn at her appointment 4/4. She thinks she was to continue as she was doing until her appointment with the cardiologist- which is what the note looks like- will send her  request over for refill. Refill request for medications by other provider: aspirin 81 mg                                                                              Furosemide 40 mg  LOV: 04/10/17  PCP: Selena BattenKim  Pharmacy: verified

## 2017-05-06 ENCOUNTER — Other Ambulatory Visit: Payer: Self-pay | Admitting: Family Medicine

## 2017-05-11 NOTE — Progress Notes (Signed)
Cardiology Office Note   Date:  05/13/2017   ID:  Genene Churn, DOB 19-Jan-1966, MRN [id number]  PCP:  Terressa Koyanagi, DO  Cardiologist:   No primary care provider on file.   Chief Complaint  Patient presents with  . Shortness of Breath      History of Present Illness: [name] is a 52 y.o. female who presents for follow up after a hospitalization for SOB.  She was managed for diastolic dysfunction.   She was discharged on a small dose of Lasix.   Since going home she is done very very well with her diet particularly watching salt and fluid.  She is down from her home discharge weight on her scales of 385 pounds down to 368 pounds.  She feels better.  She does have a little bit of a cough thinking she is getting some upper respiratory symptoms but she feels much better and her breathing than she did.  She denies any PND or orthopnea.  She is not having any chest pressure, neck or arm discomfort.    Past Medical History:  Diagnosis Date  . Allergy   . Diabetes (HCC)   . GERD (gastroesophageal reflux disease)   . Hyperlipemia   . Hypertension   . Obesity   . Venous insufficiency     Past Surgical History:  Procedure Laterality Date  . Birth Loraine Leriche removed    . CHOLECYSTECTOMY    . INTRAUTERINE DEVICE INSERTION  12/31/2013   Mirena     Current Outpatient Medications  Medication Sig Dispense Refill  . acetaminophen (TYLENOL) 500 MG tablet Take 1,000 mg by mouth daily as needed (PAIN).    . Ascorbic Acid (VITAMIN C PO) Take by mouth.    [name] aspirin 81 MG EC tablet Take 1 tablet (81 mg total) by mouth daily. 30 tablet 0  . Chlorphen-Phenyleph-ASA (ALKA-SELTZER PLUS COLD PO) Take 2 tablets by mouth daily as needed (HEAD CONGESTION, COUGH).    . Cholecalciferol (VITAMIN D3 PO) Take by mouth.    . fluticasone (FLONASE) 50 MCG/ACT nasal spray Place 1 spray into both nostrils 2 (two) times daily.     . furosemide (LASIX) 40 MG tablet Take 1 tablet (40 mg total)  by mouth daily. 30 tablet 0  . ibuprofen (ADVIL,MOTRIN) 200 MG tablet Take 400-600 mg by mouth daily as needed (pain).    [name] levonorgestrel (MIRENA) 20 MCG/24HR IUD 1 each by Intrauterine route once.    [name] losartan (COZAAR) 50 MG tablet Take 1 tablet (50 mg total) by mouth daily. 90 tablet 3  . metFORMIN (GLUCOPHAGE) 1000 MG tablet TAKE 1 TABLET (1,000 MG TOTAL) BY MOUTH 2 (TWO) TIMES DAILY WITH A MEAL. 180 tablet 1  . Multiple Vitamins-Minerals (WOMENS DAILY FORMULA PO) Take 1 tablet by mouth daily.    . naproxen sodium (ALEVE) 220 MG tablet Take 220 mg by mouth at bedtime as needed (KNEE PAIN).    [name] rosuvastatin (CRESTOR) 5 MG tablet TAKE 1 TABLET (5 MG TOTAL) BY MOUTH DAILY. 90 tablet 1   Current Facility-Administered Medications  Medication Dose Route Frequency Provider Last Rate Last Dose  . levonorgestrel (MIRENA) 20 MCG/24HR IUD   Intrauterine Once [name], Nadyne Coombes, MD        Allergies:   Patient has no known allergies.     ROS:  Please see the history of present illness.   Otherwise, review of systems are positive for none.   All other systems are  reviewed and negative.    PHYSICAL EXAM: VS:  BP (!) 156/90   Pulse 66   Ht 5\' 6"  (1.676 m)   Wt (!) 377 lb 12.8 oz (171.4 kg)   SpO2 95%   BMI 60.98 kg/m  , BMI Body mass index is 60.98 kg/m. GENERAL:  Well appearing NECK:  No jugular venous distention, waveform within normal limits, carotid upstroke brisk and symmetric, no bruits, no thyromegaly LUNGS:  Clear to auscultation bilaterally CHEST:  Unremarkable HEART:  PMI not displaced or sustained,S1 and S2 within normal limits, no S3, no S4, no clicks, no rubs, no murmurs ABD:  Flat, positive bowel sounds normal in frequency in pitch, no bruits, no rebound, no guarding, no midline pulsatile mass, no hepatomegaly, no splenomegaly, obese EXT:  2 plus pulses throughout, no edema, no cyanosis no clubbing    EKG:  EKG is not ordered today.    Recent Labs: 04/03/2017: ALT 43;  B Natriuretic Peptide 90.0 04/05/2017: Hemoglobin 14.1; Magnesium 1.9; Platelets 265 04/10/2017: BUN 15; Creatinine, Ser 0.66; Potassium 4.0; Sodium 141    Lipid Panel    Component Value Date/Time   CHOL 133 09/13/2016 0958   TRIG 109.0 09/13/2016 0958   HDL 40.30 09/13/2016 0958   CHOLHDL 3 09/13/2016 0958   VLDL 21.8 09/13/2016 0958   LDLCALC 70 09/13/2016 0958      Wt Readings from Last 3 Encounters:  05/13/17 (!) 377 lb 12.8 oz (171.4 kg)  04/10/17 (!) 388 lb (176 kg)  04/05/17 (!) 402 lb (182.3 kg)      Other studies Reviewed: Additional studies/ records that were reviewed today include: Hospital records. Review of the above records demonstrates:  Please see elsewhere in the note.     ASSESSMENT AND PLAN:  ACUTE ON CHRONIC DIASTOLIC DYSFUNCTION:   She seems to be euvolemic.  At this point she will continue the meds as listed.  I will check a comprehensive metabolic profile.  SLEEP APNEA:  It was suggested that she be evaluated for this when she was in the hospital.  I will arrange this.   OBESITY:  I am proud of her weight loss and I encourage more of the same.   HTN:  She is going to get a cuff and keep a diary.  I will review these results.  I think that her pressure will go down as she loses weight.   DM:  A1c was 7.7.  I will defer to Terressa KoyanagiKim, Hannah R, DO  Current medicines are reviewed at length with the patient today.  The patient does not have concerns regarding medicines.  The following changes have been made:  no change  Labs/ tests ordered today include:    Orders Placed This Encounter  Procedures  . Comprehensive Metabolic Panel (CMET)  . Split night study     Disposition:   FU with me in 6 months.     Signed, Rollene RotundaJames Jeptha Hinnenkamp, MD  05/13/2017 9:41 AM    [facility]

## 2017-05-12 ENCOUNTER — Other Ambulatory Visit: Payer: Self-pay | Admitting: Gynecology

## 2017-05-12 DIAGNOSIS — Z139 Encounter for screening, unspecified: Secondary | ICD-10-CM

## 2017-05-13 ENCOUNTER — Ambulatory Visit: Payer: BC Managed Care – PPO | Admitting: Cardiology

## 2017-05-13 ENCOUNTER — Encounter: Payer: Self-pay | Admitting: Cardiology

## 2017-05-13 ENCOUNTER — Ambulatory Visit
Admission: RE | Admit: 2017-05-13 | Discharge: 2017-05-13 | Disposition: A | Discharge status: Home / Self Care | Payer: BC Managed Care – PPO | Source: Ambulatory Visit | Attending: Gynecology | Admitting: Gynecology

## 2017-05-13 VITALS — BP 156/90 | HR 66 | Ht 66.0 in | Wt 377.8 lb

## 2017-05-13 DIAGNOSIS — Z139 Encounter for screening, unspecified: Secondary | ICD-10-CM

## 2017-05-13 DIAGNOSIS — G4730 Sleep apnea, unspecified: Secondary | ICD-10-CM

## 2017-05-13 DIAGNOSIS — R0683 Snoring: Secondary | ICD-10-CM

## 2017-05-13 DIAGNOSIS — G4733 Obstructive sleep apnea (adult) (pediatric): Secondary | ICD-10-CM | POA: Insufficient documentation

## 2017-05-13 DIAGNOSIS — I5033 Acute on chronic diastolic (congestive) heart failure: Secondary | ICD-10-CM | POA: Diagnosis not present

## 2017-05-13 DIAGNOSIS — Z79899 Other long term (current) drug therapy: Secondary | ICD-10-CM

## 2017-05-13 NOTE — Patient Instructions (Addendum)
Medication Instructions:  Continue current medications  If you need a refill on your cardiac medications before your next appointment, please call your pharmacy.  Labwork: CMP Today HERE IN OUR OFFICE AT [facility]  Take the provided lab slips for you to take with you to the lab for you blood draw.   You will NOT need to fast   Testing/Procedures: Your physician has recommended that you have a sleep study. This test records several body functions during sleep, including: brain activity, eye movement, oxygen and carbon dioxide blood levels, heart rate and rhythm, breathing rate and rhythm, the flow of air through your mouth and nose, snoring, body muscle movements, and chest and belly movement.   Special Instructions: [employer] Blood Pressure cuff  Follow-Up: Your physician wants you to follow-up in: 6 Months. You should receive a reminder letter in the mail two months in advance. If you do not receive a letter, please call our office [phone].     Thank you for choosing [facility]!!

## 2017-05-14 ENCOUNTER — Telehealth: Payer: Self-pay | Admitting: *Deleted

## 2017-05-14 LAB — COMPREHENSIVE METABOLIC PANEL
ALBUMIN: 4 g/dL (ref 3.5–5.5)
ALT: 24 IU/L (ref 0–32)
AST: 24 IU/L (ref 0–40)
Albumin/Globulin Ratio: 1.3 (ref 1.2–2.2)
Alkaline Phosphatase: 117 IU/L (ref 39–117)
BUN/Creatinine Ratio: 22 (ref 9–23)
BUN: 14 mg/dL (ref 6–24)
Bilirubin Total: 0.5 mg/dL (ref 0.0–1.2)
CALCIUM: 8.9 mg/dL (ref 8.7–10.2)
CHLORIDE: 99 mmol/L (ref 96–106)
CO2: 29 mmol/L (ref 20–29)
CREATININE: 0.64 mg/dL (ref 0.57–1.00)
GFR calc Af Amer: 119 mL/min/{1.73_m2} (ref >59)
GFR, EST NON AFRICAN AMERICAN: 104 mL/min/{1.73_m2} (ref >59)
Globulin, Total: 3 g/dL (ref 1.5–4.5)
Glucose: 97 mg/dL (ref 65–99)
Potassium: 3.9 mmol/L (ref 3.5–5.2)
Sodium: 146 mmol/L — ABNORMAL HIGH (ref 134–144)
Total Protein: 7 g/dL (ref 6.0–8.5)

## 2017-05-14 NOTE — Telephone Encounter (Signed)
Left sleep study appointment information along with [facility] contact information on voicemail in case she has questions and/or needs to change the appointment.

## 2017-05-15 ENCOUNTER — Ambulatory Visit: Payer: BC Managed Care – PPO | Admitting: Cardiology

## 2017-05-15 ENCOUNTER — Other Ambulatory Visit: Payer: BC Managed Care – PPO

## 2017-05-19 ENCOUNTER — Other Ambulatory Visit: Payer: Self-pay | Admitting: Family Medicine

## 2017-05-27 ENCOUNTER — Other Ambulatory Visit: Payer: Self-pay | Admitting: Family Medicine

## 2017-06-01 ENCOUNTER — Other Ambulatory Visit: Payer: Self-pay | Admitting: Family Medicine

## 2017-06-04 ENCOUNTER — Encounter (HOSPITAL_BASED_OUTPATIENT_CLINIC_OR_DEPARTMENT_OTHER): Payer: BC Managed Care – PPO

## 2017-06-07 ENCOUNTER — Ambulatory Visit (HOSPITAL_BASED_OUTPATIENT_CLINIC_OR_DEPARTMENT_OTHER): Payer: BC Managed Care – PPO

## 2017-06-10 ENCOUNTER — Encounter: Payer: BC Managed Care – PPO | Admitting: Family Medicine

## 2017-06-25 ENCOUNTER — Other Ambulatory Visit: Payer: Self-pay | Admitting: Family Medicine

## 2017-07-05 ENCOUNTER — Ambulatory Visit (HOSPITAL_BASED_OUTPATIENT_CLINIC_OR_DEPARTMENT_OTHER): Payer: BC Managed Care – PPO | Attending: Cardiology | Admitting: Cardiovascular Disease

## 2017-07-05 VITALS — Ht 66.0 in | Wt 372.0 lb

## 2017-07-05 DIAGNOSIS — G4733 Obstructive sleep apnea (adult) (pediatric): Secondary | ICD-10-CM

## 2017-07-11 ENCOUNTER — Encounter (HOSPITAL_BASED_OUTPATIENT_CLINIC_OR_DEPARTMENT_OTHER): Payer: Self-pay | Admitting: Cardiovascular Disease

## 2017-07-11 NOTE — Procedures (Signed)
Patient Name: [name], [place] Study Date: 07/05/2017 Gender: Female D.O.B: 1965/04/25 Age (years): 51 Referring Provider: Rollene RotundaJames Hochrein Height (inches): 66 Interpreting Physician: Nicki Guadalajarahomas Kelly MD, ABSM Weight (lbs): 372 RPSGT: Rolene ArbourMcConnico, Yvonne BMI: 60 MRN: [id number] Neck Size: 17.50  CLINICAL INFORMATION Sleep Study Type: NPSG  Indication for sleep study: snoring, nonrestorative sleep, super morbid obesity  Epworth Sleepiness Score: 9  SLEEP STUDY TECHNIQUE As per the AASM Manual for the Scoring of Sleep and Associated Events v2.3 (April 2016) with a hypopnea requiring 4% desaturations.  The channels recorded and monitored were frontal, central and occipital EEG, electrooculogram (EOG), submentalis EMG (chin), nasal and oral airflow, thoracic and abdominal wall motion, anterior tibialis EMG, snore microphone, electrocardiogram, and pulse oximetry.  MEDICATIONS     acetaminophen (TYLENOL) 500 MG tablet         Ascorbic Acid (VITAMIN C PO)         Chlorphen-Phenyleph-ASA (ALKA-SELTZER PLUS COLD PO)         Cholecalciferol (VITAMIN D3 PO)         CVS ASPIRIN LOW STRENGTH 81 MG EC tablet         fluticasone (FLONASE) 50 MCG/ACT nasal spray         furosemide (LASIX) 40 MG tablet         ibuprofen (ADVIL,MOTRIN) 200 MG tablet         levonorgestrel (MIRENA) 20 MCG/24HR IUD         losartan (COZAAR) 50 MG tablet         metFORMIN (GLUCOPHAGE) 1000 MG tablet         Multiple Vitamins-Minerals (WOMENS DAILY FORMULA PO)         naproxen sodium (ALEVE) 220 MG tablet         rosuvastatin (CRESTOR) 5 MG tablet      Medications self-administered by patient taken the night of the study : N/A  SLEEP ARCHITECTURE The study was initiated at 10:44:00 PM and ended at 12:43:26 AM.  Sleep onset time was 10.3 minutes and the sleep efficiency was 42.7%%. The total sleep time was 51 minutes.  Stage REM latency was N/A minutes. Reduced sleep efficiency at 42.7%.  The study ended  early due to the patient having allergy symptoms.  The patient spent 2.9%% of the night in stage N1 sleep, 97.1%% in stage N2 sleep, 0.0%% in stage N3 and 0.00% in REM.  Alpha intrusion was absent.  Supine sleep was 0.00%.  RESPIRATORY PARAMETERS The overall apnea/hypopnea index (AHI) was 97.6 per hour. There were 44 total apneas, including 44 obstructive, 0 central and 0 mixed apneas. There were 39 hypopneas and 0 RERAs.  The AHI during Stage REM sleep was N/A per hour.  AHI while supine was N/A per hour.  The mean oxygen saturation was 72.4%. The minimum SpO2 during sleep was 64.0%.  Soft snoring was noted during this study.  CARDIAC DATA The 2 lead EKG demonstrated sinus rhythm. The mean heart rate was 66.0 beats per minute. Other EKG findings include: None.  LEG MOVEMENT DATA The total PLMS were 0 with a resulting PLMS index of 0.0. Associated arousal with leg movement index was 0.0 .  IMPRESSIONS - Severe obstructive sleep apnea  (AHI 97.6/h). - No significant central sleep apnea occurred during this study (CAI = 0.0/h). - Severe oxygen desaturation to a nadir of 64%. - Abnormal sleep architecture with absence of slow wave and REM sleep. - The patient snored with soft snoring volume. - No  cardiac abnormalities were noted during this study. - Clinically significant periodic limb movements did not occur during sleep. No significant associated arousals.  DIAGNOSIS - Obstructive Sleep Apnea (327.23 [G47.33 ICD-10]) - Nocturnal Hypoxemia (327.26 [G47.36 ICD-10])  RECOMMENDATIONS - Recommend expeditious therapeutic CPAP titration to determine optimal pressure required to alleviate sleep disordered breathing. - Efforts should be made to optimize nasal and oropharyngeal patency. - Avoid alcohol, sedatives and other CNS depressants that may worsen sleep apnea and disrupt normal sleep architecture. - Sleep hygiene should be reviewed to assess factors that may improve sleep  quality. - Weight management (BMI 60) and regular exercise should be initiated or continued if appropriate.  [Electronically signed] 07/11/2017 03:31 PM  Nicki Guadalajara MD, [facility], ABSM Diplomate, American Board of Sleep Medicine   NPI: [id number] [facility] PH: [phone]   FX: [phone] ACCREDITED BY THE AMERICAN ACADEMY OF SLEEP MEDICINE

## 2017-07-13 NOTE — Progress Notes (Signed)
HPI:  Using dictation device. Unfortunately this device frequently misinterprets words/phrases.  Here for CPE:  -Concerns and/or follow up today:  Chronic medical problems summarized below were reviewed for changes.  Sees dermatologist yearly. Sees Dr. Audie BoxFontaine yearly for paps/gyn exam In process of trying to get sleep study done - sees Dr. Antoine PocheHochrein Reports not doing as well with diet recently. Trying to be more active at work. Completed cologuard and mailed last summer  Morbid Obesity/HLD/DM: -metformin, crestor -see above  HTN, CHF, Cardiomyopathy: -seeing cardiology for management -meds: lasix, losartan, statin   -Diabetes and Dyslipidemia Screening: not fasting -Vaccines: see vaccine section EPIC -pap history: sees gyn -FDLMP: see nursing notes -sexual activity: yes, female partner, no new partners -wants STI testing (Hep C if born [id number]): no -FH breast, colon or ovarian ca: see FH Last mammogram: sees gyn Last colon cancer screening: reports did cologuard last summer (2018) Breast Ca Risk Assessment: see family history and pt history DEXA (>/= 10965): n/a  -Alcohol, Tobacco, drug use: see social history  Review of Systems - no fevers, unintentional weight loss, vision loss, hearing loss, chest pain, sob, hemoptysis, melena, hematochezia, hematuria, genital discharge, changing or concerning skin lesions, bleeding, bruising, loc, thoughts of self harm or SI  Past Medical History:  Diagnosis Date  . Allergy   . Diabetes (HCC)   . GERD (gastroesophageal reflux disease)   . Hyperlipemia   . Hypertension   . Obesity   . Venous insufficiency     Past Surgical History:  Procedure Laterality Date  . Birth Loraine LericheMark removed    . CHOLECYSTECTOMY    . INTRAUTERINE DEVICE INSERTION  12/31/2013   Mirena    Family History  Problem Relation Age of Onset  . Hypertension Father   . Diabetes Father   . Cancer Paternal Grandfather        Stomach  . Cancer Maternal Uncle         Pancreatic  . Cancer Paternal Uncle        Kidney  . Hypertension Mother     Social History   Socioeconomic History  . Marital status: Married    Spouse name: Not on file  . Number of children: Not on file  . Years of education: Not on file  . Highest education level: Not on file  Occupational History  . Not on file  Social Needs  . Financial resource strain: Not on file  . Food insecurity:    Worry: Not on file    Inability: Not on file  . Transportation needs:    Medical: Not on file    Non-medical: Not on file  Tobacco Use  . Smoking status: Never Smoker  . Smokeless tobacco: Never Used  Substance and Sexual Activity  . Alcohol use: Yes    Alcohol/week: 0.0 oz    Comment: Rare  . Drug use: No  . Sexual activity: Yes    Birth control/protection: IUD    Comment: Mirena 12/31/2013-1st intercourse 52 yo-Fewer than 5 partners  Lifestyle  . Physical activity:    Days per week: Not on file    Minutes per session: Not on file  . Stress: Not on file  Relationships  . Social connections:    Talks on phone: Not on file    Gets together: Not on file    Attends religious service: Not on file    Active member of club or organization: Not on file    Attends meetings of clubs or organizations:  Not on file    Relationship status: Not on file  Other Topics Concern  . 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  Ascorbic Acid (VITAMIN C PO), Take by mouth., Disp: , Rfl:  .  Cholecalciferol (VITAMIN D3 PO), Take by mouth., Disp: , Rfl:  .  CVS ASPIRIN LOW STRENGTH 81 MG EC tablet, TAKE 1 TABLET BY MOUTH EVERY DAY, Disp: 30 tablet, Rfl: 5 .  fluticasone (FLONASE) 50 MCG/ACT nasal  spray, Place 1 spray into both nostrils 2 (two) times daily. , Disp: , Rfl:  .  furosemide (LASIX) 40 MG tablet, TAKE 1 TABLET BY MOUTH EVERY DAY, Disp: 30 tablet, Rfl: 3 .  levonorgestrel (MIRENA) 20 MCG/24HR IUD, 1 each by Intrauterine route once., Disp: , Rfl:  .  losartan (COZAAR) 50 MG tablet, Take 1 tablet (50 mg total) by mouth daily., Disp: 90 tablet, Rfl: 3 .  metFORMIN (GLUCOPHAGE) 1000 MG tablet, TAKE 1 TABLET (1,000 MG TOTAL) BY MOUTH 2 (TWO) TIMES DAILY WITH A MEAL., Disp: 180 tablet, Rfl: 1 .  Multiple Vitamins-Minerals (WOMENS DAILY FORMULA PO), Take 1 tablet by mouth daily., Disp: , Rfl:  .  rosuvastatin (CRESTOR) 5 MG tablet, TAKE 1 TABLET (5 MG TOTAL) BY MOUTH DAILY., Disp: 90 tablet, Rfl: 1  Current Facility-Administered Medications:  .  levonorgestrel (MIRENA) 20 MCG/24HR IUD, , Intrauterine, Once, [name], Timothy P, MD  EXAM:  Vitals:   07/14/17 1108  BP: 120/84  Pulse: 61  Temp: 97.8 F (36.6 C)    GENERAL: vitals reviewed and listed below, alert, oriented, appears well hydrated and in no acute distress  HEENT: head atraumatic, PERRLA, normal appearance of eyes, ears, nose and mouth. moist mucus membranes.  NECK: supple, no masses or lymphadenopathy  LUNGS: clear to auscultation bilaterally, no rales, rhonchi or wheeze  CV: HRRR, no peripheral edema or cyanosis, normal pedal pulses  ABDOMEN: bowel sounds normal, soft, non tender to palpation, no masses, no rebound or guarding  GU/BREAST: sees gyn, declined here  SKIN: sees dermatologist  MS: normal gait, moves all extremities normally  NEURO: normal gait, speech and thought processing grossly intact, muscle tone grossly intact throughout  PSYCH: normal affect, pleasant and cooperative  ASSESSMENT AND PLAN:  Discussed the following assessment and plan:  PREVENTIVE EXAM: -Discussed and advised all [place] preventive services health task force level A and B recommendations for age, sex and  risks. -Advised at least 150 minutes of exercise per week and a healthy diet with avoidance of (less then 1 serving per week) processed foods, white starches, red meat, fast foods and sweets and consisting of: * 5-9 servings of fresh fruits and vegetables (not corn or potatoes) *nuts and seeds, beans *olives and olive oil *lean meats such as fish and white chicken  *whole grains -labs, studies and vaccines per orders this encounter  2. Screening for depression -see phq9  3. Type 2 diabetes mellitus without complication, without long-term current use of insulin (HCC) - Hemoglobin A1c  4. Acute congestive heart failure, unspecified heart failure type [facility]) -sees cardiology  5. Morbid obesity (HCC) -advised wt reduction and  offered help -advised healthy low sugar diet and regular exercise -advised of [facility] weight management program and she agrees to consider - will have assistant provide information  Advised assistant to follow up on cologuard and reorder if needed. Advised assistant to obtain and abstract eye exam.   Patient Instructions  BEFORE YOU LEAVE: -obtain eye exam report -check on cologuard and notify company patient completed - reorder if needed -information on Dr. Dalbert Garnet weight management clinic -labs -follow up: 4 months  Complete your gynecology exam.  Make sure to get results or redo colon cancer screening with the cologuard company and Ronnald Collum to assist.  We have ordered labs or studies at this visit. It can take up to 1-2 weeks for results and processing. IF results require follow up or explanation, we will call you with instructions. Clinically stable results will be released to your [facility]. If you have not heard from [place] or cannot find your results in [facility] in 2 weeks please contact our office at [phone].  If you are not yet signed up for [facility], please consider signing up.   We recommend the following healthy lifestyle for LIFE: 1) Small portions.  But, make sure to get regular (at least 3 per day), healthy meals and small healthy snacks if needed.  2) Eat a healthy clean diet.   TRY TO EAT: -at least 5-7 servings of low sugar, colorful, and nutrient rich vegetables per day (not corn, potatoes or bananas.) -berries are the best choice if you wish to eat fruit (only eat small amounts if trying to reduce weight)  -lean meets (fish, white meat of chicken or [place]) -vegan proteins for some meals - beans or tofu, whole grains, nuts and seeds -Replace bad fats with good fats - good fats include: fish, nuts and seeds, canola oil, olive oil -small amounts of low fat or non fat dairy -small amounts of100 % whole grains - check the lables -drink plenty of water  AVOID: -SUGAR, sweets, anything with added sugar, corn syrup or sweeteners - must read labels as even foods advertised as "healthy" often are loaded with sugar -if you must have a sweetener, small amounts of stevia may be best -sweetened beverages and artificially sweetened beverages -simple starches (rice, bread, potatoes, pasta, chips, etc - small amounts of 100% whole grains are ok) -red meat, pork, butter -fried foods, fast food, processed food, excessive dairy, eggs and coconut.  3)Get at least 150 minutes of sweaty aerobic exercise per week.  4)Reduce stress - consider counseling, meditation and relaxation to balance other aspects of your life.           No follow-ups on file.  Terressa Koyanagi, DO

## 2017-07-14 ENCOUNTER — Telehealth: Payer: Self-pay | Admitting: *Deleted

## 2017-07-14 ENCOUNTER — Other Ambulatory Visit: Payer: Self-pay | Admitting: Cardiovascular Disease

## 2017-07-14 ENCOUNTER — Ambulatory Visit (INDEPENDENT_AMBULATORY_CARE_PROVIDER_SITE_OTHER): Payer: BC Managed Care – PPO | Admitting: Family Medicine

## 2017-07-14 ENCOUNTER — Encounter: Payer: Self-pay | Admitting: Family Medicine

## 2017-07-14 VITALS — BP 120/84 | HR 61 | Temp 97.8°F | Ht 64.5 in | Wt 377.4 lb

## 2017-07-14 DIAGNOSIS — E119 Type 2 diabetes mellitus without complications: Secondary | ICD-10-CM

## 2017-07-14 DIAGNOSIS — Z0000 Encounter for general adult medical examination without abnormal findings: Secondary | ICD-10-CM | POA: Diagnosis not present

## 2017-07-14 DIAGNOSIS — I509 Heart failure, unspecified: Secondary | ICD-10-CM

## 2017-07-14 DIAGNOSIS — Z1331 Encounter for screening for depression: Secondary | ICD-10-CM | POA: Diagnosis not present

## 2017-07-14 DIAGNOSIS — G4733 Obstructive sleep apnea (adult) (pediatric): Secondary | ICD-10-CM

## 2017-07-14 LAB — HEMOGLOBIN A1C: HEMOGLOBIN A1C: 7.3 % — AB (ref 4.6–6.5)

## 2017-07-14 NOTE — Patient Instructions (Signed)
BEFORE YOU LEAVE: -obtain eye exam report -check on cologuard and notify company patient completed - reorder if needed -information on Dr. [facility] weight management clinic -labs -follow up: 4 months  Complete your gynecology exam.  Make sure to get results or redo colon cancer screening with the cologuard company and Ronnald CollumJo Anne to assist.  We have ordered labs or studies at this visit. It can take up to 1-2 weeks for results and processing. IF results require follow up or explanation, we will call you with instructions. Clinically stable results will be released to your [facility]. If you have not heard from us or cannot find your results in [facility] in 2 weeks please contact our office at [phone].  If you are not yet signed up for [facility], please consider signing up.   We recommend the following healthy lifestyle for LIFE: 1) Small portions. But, make sure to get regular (at least 3 per day), healthy meals and small healthy snacks if needed.  2) Eat a healthy clean diet.   TRY TO EAT: -at least 5-7 servings of low sugar, colorful, and nutrient rich vegetables per day (not corn, potatoes or bananas.) -berries are the best choice if you wish to eat fruit (only eat small amounts if trying to reduce weight)  -lean meets (fish, white meat of chicken or [place]) -vegan proteins for some meals - beans or tofu, whole grains, nuts and seeds -Replace bad fats with good fats - good fats include: fish, nuts and seeds, canola oil, olive oil -small amounts of low fat or non fat dairy -small amounts of100 % whole grains - check the lables -drink plenty of water  AVOID: -SUGAR, sweets, anything with added sugar, corn syrup or sweeteners - must read labels as even foods advertised as "healthy" often are loaded with sugar -if you must have a sweetener, small amounts of stevia may be best -sweetened beverages and artificially sweetened beverages -simple starches (rice, bread, potatoes, pasta, chips, etc  - small amounts of 100% whole grains are ok) -red meat, pork, butter -fried foods, fast food, processed food, excessive dairy, eggs and coconut.  3)Get at least 150 minutes of sweaty aerobic exercise per week.  4)Reduce stress - consider counseling, meditation and relaxation to balance other aspects of your life.

## 2017-07-14 NOTE — Telephone Encounter (Signed)
Patient stated she completed the Cologuard testing and sent this in last summer.  I called [employer] and spoke with Velna Hatchet and she stated the kit was received and could not be processed.  She stated a message was left for the pt on 8/27 at 10am to contact them and they did not receive a call back.  I informed the pt of this and she was given the number to contact [employer] for repeat testing info.

## 2017-07-14 NOTE — Telephone Encounter (Signed)
Patient informed of sleep study results and recommendations.  

## 2017-07-15 ENCOUNTER — Telehealth: Payer: Self-pay | Admitting: *Deleted

## 2017-07-15 NOTE — Telephone Encounter (Signed)
Patient notified of CPAP titration appointment [facility] for [facility]03/17 [facility].

## 2017-07-17 ENCOUNTER — Encounter: Payer: Self-pay | Admitting: Family Medicine

## 2017-08-03 ENCOUNTER — Ambulatory Visit (HOSPITAL_BASED_OUTPATIENT_CLINIC_OR_DEPARTMENT_OTHER): Payer: BC Managed Care – PPO | Attending: Cardiovascular Disease

## 2017-08-03 NOTE — Procedures (Unsigned)
Pt was sick and went home.  Pt had a cold and could not breath.

## 2017-08-03 NOTE — Procedures (Unsigned)
    NAME: Wanda ChurnSuzanne McKenzie Morris DATE OF BIRTH:  12/24/1965 MEDICAL RECORD NUMBER [id number]  LOCATION: [facility]  PHYSICIAN: Armen PickupFord, Ermina Oberman  DATE OF STUDY: 08/03/2017  SLEEP STUDY TYPE: Out of Center Sleep Test                REFERRING PHYSICIAN: Lennette BihariKelly, Thomas A, MD  INDICATION FOR STUDY: ***  EPWORTH SLEEPINESS SCORE:   HEIGHT:    WEIGHT:      There is no height or weight on file to calculate BMI.  NECK SIZE:   in.  MEDICATIONS: ***  IMPRESSION:  ***    RECOMMENDATION:  ***   Armen PickupFord, Clista Rainford Diplomate, American Board of Sleep Medicine  ELECTRONICALLY SIGNED ON:  08/03/2017, 8:26 PM [facility] PH: [phone]   FX: [phone] ACCREDITED BY THE AMERICAN ACADEMY OF SLEEP MEDICINE

## 2017-08-05 LAB — COLOGUARD: Cologuard: NEGATIVE

## 2017-08-10 ENCOUNTER — Other Ambulatory Visit: Payer: Self-pay | Admitting: Family Medicine

## 2017-08-10 NOTE — Progress Notes (Unsigned)
This encounter was created in error - please disregard.  This encounter was created in error - please disregard.

## 2017-08-11 ENCOUNTER — Other Ambulatory Visit: Payer: Self-pay

## 2017-08-11 MED ORDER — ATORVASTATIN CALCIUM 20 MG PO TABS: 20.0000 mg | ORAL_TABLET | Freq: Every day | ORAL | 3 refills | Status: DC

## 2017-08-11 NOTE — Telephone Encounter (Signed)
Discussed options with the patient. Patient stated it was ok to sent in the Atorvastatin 20mg . [facility]. Patient is aware.

## 2017-08-11 NOTE — Telephone Encounter (Signed)
[facility]atin [facility]mg  [facility] with patients permission. Patient is aware.

## 2017-08-11 NOTE — Telephone Encounter (Signed)
If wants to do lovastatin - send 20mg  once daily. Atorvastatin would be my preference -20mg .  Once daily. # 90, 3 rf. Delete crestor off list.

## 2017-08-18 ENCOUNTER — Encounter: Payer: Self-pay | Admitting: Family Medicine

## 2017-08-21 ENCOUNTER — Encounter: Payer: Self-pay | Admitting: Family Medicine

## 2017-09-08 ENCOUNTER — Encounter: Payer: BLUE CROSS/BLUE SHIELD | Admitting: Gynecology

## 2017-09-25 ENCOUNTER — Ambulatory Visit (HOSPITAL_BASED_OUTPATIENT_CLINIC_OR_DEPARTMENT_OTHER): Payer: BC Managed Care – PPO | Attending: Cardiovascular Disease | Admitting: Cardiovascular Disease

## 2017-09-25 ENCOUNTER — Other Ambulatory Visit: Payer: Self-pay | Admitting: Family Medicine

## 2017-09-25 VITALS — Ht 64.5 in | Wt 377.0 lb

## 2017-09-25 DIAGNOSIS — G4733 Obstructive sleep apnea (adult) (pediatric): Secondary | ICD-10-CM | POA: Diagnosis present

## 2017-10-09 ENCOUNTER — Encounter (INDEPENDENT_AMBULATORY_CARE_PROVIDER_SITE_OTHER): Payer: BLUE CROSS/BLUE SHIELD

## 2017-10-12 ENCOUNTER — Encounter (HOSPITAL_BASED_OUTPATIENT_CLINIC_OR_DEPARTMENT_OTHER): Payer: Self-pay | Admitting: Cardiovascular Disease

## 2017-10-12 NOTE — Procedures (Signed)
Patient Name: [name], [name] Date: 09/25/2017 Gender: Female D.O.B: 03-11-1965 Age (years): 54 Referring Provider: Nicki Guadalajara MD, ABSM Height (inches): 66 Interpreting Physician: Nicki Guadalajara MD, ABSM Weight (lbs): 372 RPSGT: Heugly, [place] BMI: 60 MRN: [id number] Neck Size: 17.50  CLINICAL INFORMATION The patient is referred for a BiPAP titration to treat sleep apnea.  Date of NPSG:  Jul 05 2017:  AHI 97.6/h; absent REM and supine sleep; O2 desaturation to 64%.  SLEEP STUDY TECHNIQUE As per the AASM Manual for the Scoring of Sleep and Associated Events v2.3 (April 2016) with a hypopnea requiring 4% desaturations.  The channels recorded and monitored were frontal, central and occipital EEG, electrooculogram (EOG), submentalis EMG (chin), nasal and oral airflow, thoracic and abdominal wall motion, anterior tibialis EMG, snore microphone, electrocardiogram, and pulse oximetry. Bilevel positive airway pressure (BPAP) was initiated at the beginning of the study and titrated to treat sleep-disordered breathing.  MEDICATIONS     Ascorbic Acid (VITAMIN C PO)         atorvastatin (LIPITOR) 20 MG tablet         Cholecalciferol (VITAMIN D3 PO)         CVS ASPIRIN LOW STRENGTH 81 MG EC tablet         fluticasone (FLONASE) 50 MCG/ACT nasal spray         furosemide (LASIX) 40 MG tablet         levonorgestrel (MIRENA) 20 MCG/24HR IUD         losartan (COZAAR) 50 MG tablet         metFORMIN (GLUCOPHAGE) 1000 MG tablet         Multiple Vitamins-Minerals (WOMENS DAILY FORMULA PO)      Medications self-administered by patient taken the night of the study : N/A  RESPIRATORY PARAMETERS Optimal IPAP Pressure (cm): 23 AHI at Optimal Pressure (/hr) 22.0 Optimal EPAP Pressure (cm): 15   Overall Minimal O2 (%): 69.0 Minimal O2 at Optimal Pressure (%): 87.0  SLEEP ARCHITECTURE Start Time: 11:08:12 PM Stop Time: 5:07:07 AM Total Time (min): 358.9 Total Sleep Time  (min): 295.5 Sleep Latency (min): 19.3 Sleep Efficiency (%): 82.3% REM Latency (min): 42.0 WASO (min): 44.1 Stage N1 (%): 4.6% Stage N2 (%): 61.6% Stage N3 (%): 0.8% Stage R (%): 33 Supine (%): 0.00 Arousal Index (/hr): 15.6    CARDIAC DATA The 2 lead EKG demonstrated sinus rhythm. The mean heart rate was 59.8 beats per minute. Other EKG findings include: PVCs.  LEG MOVEMENT DATA The total Periodic Limb Movements of Sleep (PLMS) were 0. The PLMS index was 0.0. A PLMS index of <15 is considered normal in adults.  IMPRESSIONS - CPAP was initiated at 5 cm and was titrated up to 18 cm water pressure.  BiPAP was started at 18/14 and was titrated to a maximum of 25/15.  Optimal PA pressure was felt to be 23/15.  due to continued low oxygen saturations, she was started on supplemental oxygen and required titration up to 3 L/m. - Central sleep apnea was not noted during this titration (CAI = 3.7/h). - Severe oxygen desaturations to a nadir of 69.0%. - The patient snored with soft snoring volume. - 2-lead EKG demonstrated: PVCs - Clinically significant periodic limb movements were not noted during this study. Arousals associated with PLMs were rare.  DIAGNOSIS - Obstructive Sleep Apnea (327.23 [G47.33 ICD-10]) - Nocturnal oxygen desaturation  RECOMMENDATIONS - Recommend an initial trial of [place] BiPAP therapy with BiFlex of 3 and SmartRamp  at 23/15 cm H2O with 3 L/m of supplemental oxygen and heated humidification. A Medium size Fisher[employer]Paykel Full Face Mask Simplus mask was used for the titration. - Effort should be made to optimize nasal and oral pharyngeal patency. - Avoid alcohol, sedatives and other CNS depressants that may worsen sleep apnea and disrupt normal sleep architecture. - Sleep hygiene should be reviewed to assess factors that may improve sleep quality. - Weight management and regular exercise should be initiated or continued. - Recommend a download be obtained in 30 days  and sleep clinic evaluation after 4 weeks of therapy.  [Electronically signed] 10/12/2017 02:46 PM  Nicki Guadalajara MD, [facility], ABSM Diplomate, American Board of Sleep Medicine   NPI: [id number] [place] [facility] PH: [phone]   FX: [phone] ACCREDITED BY THE AMERICAN ACADEMY OF SLEEP MEDICINE

## 2017-10-16 ENCOUNTER — Ambulatory Visit: Payer: BC Managed Care – PPO | Admitting: Gynecology

## 2017-10-16 ENCOUNTER — Encounter: Payer: Self-pay | Admitting: Gynecology

## 2017-10-16 VITALS — BP 140/90 | Ht 65.5 in | Wt 377.0 lb

## 2017-10-16 DIAGNOSIS — N951 Menopausal and female climacteric states: Secondary | ICD-10-CM | POA: Diagnosis not present

## 2017-10-16 DIAGNOSIS — Z01419 Encounter for gynecological examination (general) (routine) without abnormal findings: Secondary | ICD-10-CM

## 2017-10-16 DIAGNOSIS — Z30431 Encounter for routine checking of intrauterine contraceptive device: Secondary | ICD-10-CM | POA: Diagnosis not present

## 2017-10-16 NOTE — Progress Notes (Signed)
    [name] [birth year] [id number]        52 y.o.  G2P2 for annual gynecologic exam.  Doing well without gynecologic complaints.  She remains amenorrheic with her Mirena IUD.  She is having some hot flushes but not significant.  Past medical history,surgical history, problem list, medications, allergies, family history and social history were all reviewed and documented as reviewed in the EPIC chart.  ROS:  Performed with pertinent positives and negatives included in the history, assessment and plan.   Additional significant findings : None   Exam: [name] assistant Vitals:   10/16/17 0757  BP: 140/90  Weight: (!) 377 lb (171 kg)  Height: 5' 5.5" (1.664 m)   Body mass index is 61.78 kg/m.  General appearance:  Normal affect, orientation and appearance. Skin: Grossly normal HEENT: Without gross lesions.  No cervical or supraclavicular adenopathy. Thyroid normal.  Lungs:  Clear without wheezing, rales or rhonchi Cardiac: RR, without RMG Abdominal:  Soft, nontender, without masses, guarding, rebound, organomegaly or hernia Breasts:  Examined lying and sitting without masses, retractions, discharge or axillary adenopathy. Pelvic:  Ext, BUS, Vagina: Normal  Cervix: Normal.  IUD string visualized  Uterus: Unable to palpate but no gross masses or tenderness  Adnexa: Without gross masses or tenderness    Anus and perineum: Normal   Rectovaginal: Normal sphincter tone without palpated masses or tenderness.    Assessment/Plan:  52 y.o. G2P2 female for annual gynecologic exam.   1. Mirena IUD 12/2013.  Doing well without menses.  IUD string visualized.  Due to be replaced next year at 5-year interval. 2. Amenorrheic with some hot flushes.  Will check baseline FSH to see where she stands.  At this point her symptoms are not significant to warrant treatment.  We will follow-up if her menopausal symptoms worsen. 3. Pap smear/HPV 12/2013 no Pap smear done today.  No history  of significant abnormal Pap smears.  Plan repeat Pap smear next year at 5-year interval per current screening guidelines. 4. Mammography 05/2017.  Continue with annual mammography when due.  Breast exam normal today. 5. Colonoscopy never.  Is doing Cologuard through her primary physician's office.  She will continue to follow-up with her primary physician for colon screening recommendations. 6. Health maintenance.  No routine blood work drawn as she has an appointment Monday with her primary physician's office.  She will follow-up also for her blood pressure recheck noting blood pressure today 140/90.  She is followed for hypertension.  Follow-up in 1 year, sooner as needed.   Dara Lords MD, 8:24 AM 10/16/2017

## 2017-10-16 NOTE — Patient Instructions (Signed)
Follow-up in 1 year for annual exam, sooner of any issues.

## 2017-10-17 ENCOUNTER — Encounter: Payer: Self-pay | Admitting: Gynecology

## 2017-10-17 ENCOUNTER — Telehealth: Payer: Self-pay | Admitting: *Deleted

## 2017-10-17 LAB — FOLLICLE STIMULATING HORMONE: FSH: 21.7 m[IU]/mL

## 2017-10-17 NOTE — Telephone Encounter (Signed)
Patient notified sleep study has been completed. Referral for a BIPAP machine with O2 has been sent to [facility]. Once they receive her insurance benefits, they will contact her for set up. Patient advised that if she hasn't heard from [facility] within a week she is to call me back so that I can check the status. She voiced verbal understanding.

## 2017-10-17 NOTE — Telephone Encounter (Signed)
-----   Message from Lennette Bihari, MD sent at 10/12/2017  2:53 PM EDT ----- Burna Mortimer, difficult BiPAP titration.  Set up with DME company.  I have suggested a dream Station BiPAP device with smart ramp.  The patientwas titrated up to 23/15 and required 3 L of supplemental oxygen per minute to overcome significant hypoxemia. Obtain download her set up for sleep clinic evaluation.  May ultimately need overnight oximetry on supplemental oxygen to make certain hypoxemia is corrected.

## 2017-10-20 ENCOUNTER — Encounter (INDEPENDENT_AMBULATORY_CARE_PROVIDER_SITE_OTHER): Payer: Self-pay | Admitting: Family Medicine

## 2017-10-20 ENCOUNTER — Ambulatory Visit (INDEPENDENT_AMBULATORY_CARE_PROVIDER_SITE_OTHER): Payer: BC Managed Care – PPO | Admitting: Family Medicine

## 2017-10-20 ENCOUNTER — Other Ambulatory Visit (INDEPENDENT_AMBULATORY_CARE_PROVIDER_SITE_OTHER): Payer: Self-pay | Admitting: Family Medicine

## 2017-10-20 VITALS — BP 148/74 | HR 69 | Ht 65.0 in | Wt 378.0 lb

## 2017-10-20 DIAGNOSIS — R0602 Shortness of breath: Secondary | ICD-10-CM

## 2017-10-20 DIAGNOSIS — E119 Type 2 diabetes mellitus without complications: Secondary | ICD-10-CM

## 2017-10-20 DIAGNOSIS — Z1331 Encounter for screening for depression: Secondary | ICD-10-CM

## 2017-10-20 DIAGNOSIS — Z9189 Other specified personal risk factors, not elsewhere classified: Secondary | ICD-10-CM

## 2017-10-20 DIAGNOSIS — I10 Essential (primary) hypertension: Secondary | ICD-10-CM

## 2017-10-20 DIAGNOSIS — Z6841 Body Mass Index (BMI) 40.0 and over, adult: Secondary | ICD-10-CM

## 2017-10-20 DIAGNOSIS — R5383 Other fatigue: Secondary | ICD-10-CM | POA: Diagnosis not present

## 2017-10-20 DIAGNOSIS — Z0289 Encounter for other administrative examinations: Secondary | ICD-10-CM

## 2017-10-20 MED ORDER — ACCU-CHEK SAFE-T PRO LANCETS MISC: 0 refills | Status: DC

## 2017-10-20 MED ORDER — GLUCOSE BLOOD VI STRP: ORAL_STRIP | 0 refills | Status: DC

## 2017-10-20 MED ORDER — BLOOD GLUCOSE METER KIT: PACK | 0 refills | Status: DC

## 2017-10-20 MED ORDER — BLOOD GLUCOSE MONITOR KIT: PACK | 0 refills | Status: DC

## 2017-10-21 NOTE — Progress Notes (Signed)
Office: [phone]  /  Fax: [phone]   Dear Dr. Cousins,   Thank you for referring [name] to our clinic. The following note includes my evaluation and treatment recommendations.  HPI:   Chief Complaint: OBESITY    [name] has been referred by Sheronette A. Cousins, MD for consultation regarding her obesity and obesity related comorbidities.    [name] (MR# [id number]) is a 52 y.o. female who presents on 10/21/2017 for obesity evaluation and treatment. Current BMI is Body mass index is 62.9 kg/m.. [name] has been struggling with her weight for many years and has been unsuccessful in either losing weight, maintaining weight loss, or reaching her healthy weight goal.     [name] attended our information session and states she is currently in the action stage of change and ready to dedicate time achieving and maintaining a healthier weight. [name] is interested in becoming our patient and working on intensive lifestyle modifications including (but not limited to) diet, exercise and weight loss.    [name] states her family eats meals together she thinks her family will eat healthier with  her she struggles with family and or coworkers weight loss sabotage her desired weight is <200 lbs she has been heavy most of  her life she started gaining weight after marriage and children her heaviest weight ever was 384 lbs. she has significant food cravings issues  she is frequently drinking liquids with calories she frequently makes poor food choices she frequently eats larger portions than normal  she struggles with emotional eating    [name] feels her energy is lower than it should be. This has worsened with weight gain and has not worsened recently. [name] admits to daytime somnolence and admits to waking up still tired. Patient is at risk for obstructive sleep apnea. Patent has a history of symptoms of daytime fatigue, morning fatigue  and hypertension. Patient generally gets 9 hours of sleep per night, and states they generally have restless sleep. Snoring is present. Apneic episodes are present. Epworth Sleepiness Score is 9  Dyspnea on exertion [name] notes increasing shortness of breath with exercising and seems to be worsening over time with weight gain. She notes getting out of breath sooner with activity than she used to. This has not gotten worse recently. [name] denies orthopnea.  Hypertension Kadra McKenzie Morris is a 52 y.o. female with hypertension. Her blood pressure I s elevated today and she didn't take her medications this morning due to fasting. [name] denies chest pain. She is attempting to work on weight loss to help control her blood pressure with the goal of decreasing her risk of heart attack and stroke. Suzannes blood pressure is not currently controlled.  Diabetes II [name] has a diagnosis of diabetes type II. [name] doesn't check her blood sugar regularly and her last A1c was at 7.3. [name] admits polyphagia and denies any hypoglycemic episodes. She is attempting to work on intensive lifestyle modifications including diet, exercise, and weight loss to help control her blood glucose levels.  At risk for cardiovascular disease [name] is at a higher than average risk for cardiovascular disease due to obesity, hypertension and diabetes. She currently denies any chest pain.  Depression Screen Modest had a positive depression screen with significant feelings of guilt and feeling out of control. She suspects that she is addicted to certain foods and she likes simple carbohydrates.  [name]'s Food and Mood (modified PHQ-9) score was  Depression screen PHQ 2/9 10/20/2017    Decreased Interest 3  Down, Depressed, Hopeless 2  PHQ - 2 Score 5  Altered sleeping 3  Tired, decreased energy 3  Change in appetite 3  Feeling bad or failure about yourself  2  Trouble concentrating 1  Moving  slowly or fidgety/restless 3  Suicidal thoughts 0  PHQ-9 Score 20  Difficult doing work/chores Somewhat difficult    ALLERGIES: No Known Allergies  MEDICATIONS: Current Outpatient Medications on File Prior to Visit  Medication Sig Dispense Refill  . acetaminophen (TYLENOL) 500 MG tablet Take 500 mg by mouth every 6 (six) hours as needed.    . Ascorbic Acid (VITAMIN C PO) Take by mouth.    [name] atorvastatin (LIPITOR) 20 MG tablet Take 1 tablet (20 mg total) by mouth daily. 90 tablet 3  . Cholecalciferol (VITAMIN D3 PO) Take by mouth.    . [employer] ASPIRIN LOW STRENGTH 81 MG EC tablet TAKE 1 TABLET BY MOUTH EVERY DAY 30 tablet 5  . fluticasone (FLONASE) 50 MCG/ACT nasal spray Place 1 spray into both nostrils 2 (two) times daily.     . furosemide (LASIX) 40 MG tablet TAKE 1 TABLET BY MOUTH EVERY DAY 90 tablet 1  . ibuprofen (ADVIL,MOTRIN) 200 MG tablet Take 200 mg by mouth every 6 (six) hours as needed.    [name] levonorgestrel (MIRENA) 20 MCG/24HR IUD 1 each by Intrauterine route once.    [name] losartan (COZAAR) 50 MG tablet Take 1 tablet (50 mg total) by mouth daily. 90 tablet 3  . metFORMIN (GLUCOPHAGE) 1000 MG tablet TAKE 1 TABLET (1,000 MG TOTAL) BY MOUTH 2 (TWO) TIMES DAILY WITH A MEAL. 180 tablet 1  . Multiple Vitamins-Minerals (WOMENS DAILY FORMULA PO) Take 1 tablet by mouth daily.    [name]-Propyl Glycol (SYSTANE) 0.4-0.3 % SOLN Apply to eye.    . Pseudoephedrine-Acetaminophen (ALKA-SELTZER PLUS COLD/SINUS PO) Take by mouth.     Current Facility-Administered Medications on File Prior to Visit  Medication Dose Route Frequency Provider Last Rate Last Dose  . levonorgestrel (MIRENA) 20 MCG/24HR IUD   Intrauterine Once [name], [name], MD        PAST MEDICAL HISTORY: Past Medical History:  Diagnosis Date  . Allergy   . Back pain   . CHF (congestive heart failure) ([place])   . Diabetes ([place])   . Dyspnea   . Gallbladder problem   . GERD (gastroesophageal reflux disease)   . Hip  pain   . Hyperlipemia   . Hypertension   . Knee pain   . Leg edema   . Obesity   . Sleep apnea   . Venous insufficiency     PAST SURGICAL HISTORY: Past Surgical History:  Procedure Laterality Date  . Birth [place]ta [place] removed    . CHOLECYSTECTOMY    . INTRAUTERINE DEVICE INSERTION  12/31/2013   Mirena    SOCIAL HISTORY: Social History   Tobacco Use  . Smoking status: Never Smoker  . Smokeless tobacco: Never Used  Substance Use Topics  . Alcohol use: Yes    Alcohol/week: 0.0 standard drinks    Comment: Rare  . Drug use: No    FAMILY HISTORY: Family History  Problem Relation Age of Onset  . Hypertension Father   . Diabetes Father   . Obesity Father   . Cancer Paternal Grandfather        Stomach  . Cancer Maternal Uncle        Pancreatic  . Cancer Paternal Uncle  Kidney  . Hypertension Mother   . Anxiety disorder Mother     ROS: Review of Systems  Constitutional: Positive for malaise/fatigue.  HENT: Positive for congestion (nasal stuffiness) and sinus pain.        Hay Fever   Eyes:       Dry Eyes Wear Glasses or Contacts  Respiratory: Positive for shortness of breath (with activity).   Cardiovascular: Negative for chest pain and orthopnea.  Musculoskeletal: Positive for back pain.       Muscle or Joint Pain  Skin:       Dryness Nail Changes  Endo/Heme/Allergies:       Positive for polyphagia Negative for hypoglycemia  Psychiatric/Behavioral:       Stress    PHYSICAL EXAM: Blood pressure (!) 148/74, pulse 69, height 5' 5" (1.651 m), weight (!) 378 lb (171.5 kg), SpO2 94 %. Body mass index is 62.9 kg/m. Physical Exam  Constitutional: She is oriented to person, place, and time. She appears well-developed and well-nourished.  HENT:  Head: Normocephalic and atraumatic.  Nose: Nose normal.  Eyes: EOM are normal. No scleral icterus.  Neck: Normal range of motion. Neck supple. No thyromegaly present.  Cardiovascular: Normal rate and regular  rhythm.  Murmur heard.  Systolic (holosystolic) murmur is present with a grade of 3/6. Pulmonary/Chest: Effort normal. No respiratory distress.  Abdominal: Soft. There is no tenderness.  + obesity  Musculoskeletal: Normal range of motion.  Range of Motion normal in all 4 extremities  Neurological: She is alert and oriented to person, place, and time. Coordination normal.  Skin: Skin is warm and dry.  Psychiatric: She has a normal mood and affect. Her behavior is normal.  Vitals reviewed.   RECENT LABS AND TESTS: BMET    Component Value Date/Time   NA 142 10/20/2017 0940   K 4.1 10/20/2017 0940   CL 100 10/20/2017 0940   CO2 28 10/20/2017 0940   GLUCOSE 101 (H) 10/20/2017 0940   GLUCOSE 115 (H) 04/10/2017 1656   BUN 11 10/20/2017 0940   CREATININE 0.56 (L) 10/20/2017 0940   CALCIUM 9.2 10/20/2017 0940   GFRNONAA 108 10/20/2017 0940   GFRAA 124 10/20/2017 0940   Lab Results  Component Value Date   HGBA1C 7.2 (H) 10/20/2017   Lab Results  Component Value Date   INSULIN 9.1 10/20/2017   CBC    Component Value Date/Time   WBC 7.9 10/20/2017 0940   WBC 10.6 (H) 04/05/2017 0443   RBC 5.35 (H) 10/20/2017 0940   RBC 5.16 (H) 04/05/2017 0443   HGB 15.0 10/20/2017 0940   HCT 48.0 (H) 10/20/2017 0940   PLT 265 04/05/2017 0443   MCV 90 10/20/2017 0940   MCH 28.0 10/20/2017 0940   MCH 27.3 04/05/2017 0443   MCHC 31.3 (L) 10/20/2017 0940   MCHC 29.4 (L) 04/05/2017 0443   RDW 16.0 (H) 10/20/2017 0940   LYMPHSABS 1.2 10/20/2017 0940   MONOABS 0.7 04/05/2017 0443   EOSABS 0.1 10/20/2017 0940   BASOSABS 0.1 10/20/2017 0940   Iron/TIBC/Ferritin/ %Sat No results found for: IRON, TIBC, FERRITIN, IRONPCTSAT Lipid Panel     Component Value Date/Time   CHOL 127 10/20/2017 0940   TRIG 91 10/20/2017 0940   HDL 47 10/20/2017 0940   CHOLHDL 3 09/13/2016 0958   VLDL 21.8 09/13/2016 0958   LDLCALC 62 10/20/2017 0940   Hepatic Function Panel     Component Value Date/Time    PROT 7.1 10/20/2017 0940   ALBUMIN  4.1 10/20/2017 0940   AST 22 10/20/2017 0940   ALT 27 10/20/2017 0940   ALKPHOS 130 (H) 10/20/2017 0940   BILITOT 0.6 10/20/2017 0940      Component Value Date/Time   TSH 2.790 10/20/2017 0940    ECG  shows NSR with a rate of 71 BPM INDIRECT CALORIMETER done today shows a VO2 of 337 and a REE of 2344.  Her calculated basal metabolic rate is 2013 thus her basal metabolic rate is better than expected.    ASSESSMENT AND PLAN: Other fatigue - Plan: EKG 12-Lead, CBC With Differential, Vitamin B12, T3, T4, free, TSH, VITAMIN D 25 Hydroxy (Vit-D Deficiency, Fractures), Folate  Shortness of breath on exertion - Plan: CBC With Differential  Essential hypertension - Plan: Lipid Panel With LDL/HDL Ratio  Type 2 diabetes mellitus without complication, without long-term current use of insulin (HCC) - Plan: Comprehensive metabolic panel, Hemoglobin A1c, Insulin, random, Microalbumin / creatinine urine ratio, Lancets (ACCU-CHEK SAFE-T PRO) lancets, blood glucose meter kit and supplies, DISCONTINUED: glucose blood (ACCU-CHEK AVIVA) test strip, DISCONTINUED: blood glucose meter kit and supplies KIT, DISCONTINUED: blood glucose meter kit and supplies KIT  Positive depression screening  At risk for heart disease  Class 3 severe obesity with serious comorbidity and body mass index (BMI) of 60.0 to 69.9 in adult, unspecified obesity type (HCC)  PLAN: Fatigue Jasmain was informed that her fatigue may be related to obesity, depression or many other causes. Labs will be ordered, and in the meanwhile Devan has agreed to work on diet, exercise and weight loss to help with fatigue. Proper sleep hygiene was discussed including the need for 7-8 hours of quality sleep each night. A sleep study was not ordered based on symptoms and Epworth score.  Dyspnea on exertion Charrisse's shortness of breath appears to be obesity related and exercise induced. She has agreed to work on  weight loss and gradually increase exercise to treat her exercise induced shortness of breath. If Curtis follows our instructions and loses weight without improvement of her shortness of breath, we will plan to refer to pulmonology. We will monitor this condition regularly. Frayda agrees to this plan.  Hypertension We discussed sodium restriction, working on healthy weight loss, and a regular exercise program as the means to achieve improved blood pressure control. Karys agreed with this plan and agreed to follow up as directed. We will continue to monitor her blood pressure as well as her progress with the above lifestyle modifications. She will start diet and we will check labs. She will continue her medications as prescribed and will watch for signs of hypotension as she continues her lifestyle modifications.  Diabetes II Brieonna has been given extensive diabetes education by myself today including ideal fasting and post-prandial blood glucose readings, individual ideal Hgb A1c goals and hypoglycemia prevention. We discussed the importance of good blood sugar control to decrease the likelihood of diabetic complications such as nephropathy, neuropathy, limb loss, blindness, coronary artery disease, and death. We discussed the importance of intensive lifestyle modification including diet, exercise and weight loss as the first line treatment for diabetes. Laneah agrees to continue metformin and will follow up at the agreed upon time. We will start glucometer strips and lancets and will check labs. Crissa agrees to start diet.   Cardiovascular risk counseling Geneal was given extended (15 minutes) coronary artery disease prevention counseling today. She is 52 y.o. female and has risk factors for heart disease including obesity, hypertension and diabetes. We discussed intensive   lifestyle modifications today with an emphasis on specific weight loss instructions and strategies. Pt was also informed of  the importance of increasing exercise and decreasing saturated fats to help prevent heart disease.  Depression Screen Gretna had a strongly positive depression screening. Depression is commonly associated with obesity and often results in emotional eating behaviors. We will monitor this closely and work on CBT to help improve the non-hunger eating patterns. We will refer to Dr. Mallie Mussel our bariatric psychologist.  Obesity Elara is currently in the action stage of change and her goal is to continue with weight loss efforts. I recommend Adler begin the structured treatment plan as follows:  She has agreed to follow the Category 4 plan Adelynn has been instructed to eventually work up to a goal of 150 minutes of combined cardio and strengthening exercise per week for weight loss and overall health benefits. We discussed the following Behavioral Modification Strategies today: increasing lean protein intake, decreasing simple carbohydrates , work on meal planning and easy cooking plans and dealing with family or coworker sabotage   She was informed of the importance of frequent follow up visits to maximize her success with intensive lifestyle modifications for her multiple health conditions. She was informed we would discuss her lab results at her next visit unless there is a critical issue that needs to be addressed sooner. Preston agreed to keep her next visit at the agreed upon time to discuss these results.    OBESITY BEHAVIORAL INTERVENTION VISIT  Today's visit was # 1   Starting weight: 378 lbs Starting date: 10/20/17 Today's weight : 378 lbs  Today's date: 10/20/2017 Total lbs lost to date: 0   ASK: We discussed the diagnosis of obesity with [name] today and Dennisha agreed to give [place] permission to discuss obesity behavioral modification therapy today.  ASSESS: Xzaria has the diagnosis of obesity and her BMI today is 62.9 Carrigan is in the action stage of change    ADVISE: Laresha was educated on the multiple health risks of obesity as well as the benefit of weight loss to improve her health. She was advised of the need for long term treatment and the importance of lifestyle modifications to improve her current health and to decrease her risk of future health problems.  AGREE: Multiple dietary modification options and treatment options were discussed and  Ferne agreed to follow the recommendations documented in the above note.  ARRANGE: Simmie was educated on the importance of frequent visits to treat obesity as outlined per CMS and USPSTF guidelines and agreed to schedule her next follow up appointment today.  I, Doreene Nest, am acting as transcriptionist for Dennard Nip, MD   I have reviewed the above documentation for accuracy and completeness, and I agree with the above. -Dennard Nip, MD

## 2017-10-23 LAB — T3: T3, Total: 121 ng/dL (ref 71–180)

## 2017-10-23 LAB — TSH: TSH: 2.79 u[IU]/mL (ref 0.450–4.500)

## 2017-10-23 LAB — CBC WITH DIFFERENTIAL
BASOS ABS: 0.1 10*3/uL (ref 0.0–0.2)
Basos: 1 %
EOS (ABSOLUTE): 0.1 10*3/uL (ref 0.0–0.4)
Eos: 1 %
HEMOGLOBIN: 15 g/dL (ref 11.1–15.9)
Hematocrit: 48 % — ABNORMAL HIGH (ref 34.0–46.6)
Immature Grans (Abs): 0 10*3/uL (ref 0.0–0.1)
Immature Granulocytes: 0 %
Lymphocytes Absolute: 1.2 10*3/uL (ref 0.7–3.1)
Lymphs: 16 %
MCH: 28 pg (ref 26.6–33.0)
MCHC: 31.3 g/dL — AB (ref 31.5–35.7)
MCV: 90 fL (ref 79–97)
MONOCYTES: 6 %
Monocytes Absolute: 0.4 10*3/uL (ref 0.1–0.9)
Neutrophils Absolute: 6.1 10*3/uL (ref 1.4–7.0)
Neutrophils: 76 %
RBC: 5.35 x10E6/uL — ABNORMAL HIGH (ref 3.77–5.28)
RDW: 16 % — AB (ref 12.3–15.4)
WBC: 7.9 10*3/uL (ref 3.4–10.8)

## 2017-10-23 LAB — HEMOGLOBIN A1C
ESTIMATED AVERAGE GLUCOSE: 160 mg/dL
Hgb A1c MFr Bld: 7.2 % — ABNORMAL HIGH (ref 4.8–5.6)

## 2017-10-23 LAB — VITAMIN B12: Vitamin B-12: 372 pg/mL (ref 232–1245)

## 2017-10-23 LAB — COMPREHENSIVE METABOLIC PANEL
ALK PHOS: 130 IU/L — AB (ref 39–117)
ALT: 27 IU/L (ref 0–32)
AST: 22 IU/L (ref 0–40)
Albumin/Globulin Ratio: 1.4 (ref 1.2–2.2)
Albumin: 4.1 g/dL (ref 3.5–5.5)
BILIRUBIN TOTAL: 0.6 mg/dL (ref 0.0–1.2)
BUN/Creatinine Ratio: 20 (ref 9–23)
BUN: 11 mg/dL (ref 6–24)
CHLORIDE: 100 mmol/L (ref 96–106)
CO2: 28 mmol/L (ref 20–29)
Calcium: 9.2 mg/dL (ref 8.7–10.2)
Creatinine, Ser: 0.56 mg/dL — ABNORMAL LOW (ref 0.57–1.00)
GFR calc Af Amer: 124 mL/min/{1.73_m2} (ref >59)
GFR calc non Af Amer: 108 mL/min/{1.73_m2} (ref >59)
GLUCOSE: 101 mg/dL — AB (ref 65–99)
Globulin, Total: 3 g/dL (ref 1.5–4.5)
Potassium: 4.1 mmol/L (ref 3.5–5.2)
Sodium: 142 mmol/L (ref 134–144)
Total Protein: 7.1 g/dL (ref 6.0–8.5)

## 2017-10-23 LAB — LIPID PANEL WITH LDL/HDL RATIO
CHOLESTEROL TOTAL: 127 mg/dL (ref 100–199)
HDL: 47 mg/dL (ref >39)
LDL CALC: 62 mg/dL (ref 0–99)
LDl/HDL Ratio: 1.3 ratio (ref 0.0–3.2)
TRIGLYCERIDES: 91 mg/dL (ref 0–149)
VLDL Cholesterol Cal: 18 mg/dL (ref 5–40)

## 2017-10-23 LAB — VITAMIN D 25 HYDROXY (VIT D DEFICIENCY, FRACTURES): Vit D, 25-Hydroxy: 39 ng/mL (ref 30.0–100.0)

## 2017-10-23 LAB — INSULIN, RANDOM: INSULIN: 9.1 u[IU]/mL (ref 2.6–24.9)

## 2017-10-23 LAB — FOLATE: FOLATE: 19.8 ng/mL (ref >3.0)

## 2017-10-23 LAB — MICROALBUMIN / CREATININE URINE RATIO
Creatinine, Urine: 151.6 mg/dL
MICROALBUM., U, RANDOM: 33.9 ug/mL
Microalb/Creat Ratio: 22.4 mg/g creat (ref 0.0–30.0)

## 2017-10-23 LAB — T4, FREE: FREE T4: 1.33 ng/dL (ref 0.82–1.77)

## 2017-11-05 ENCOUNTER — Ambulatory Visit (INDEPENDENT_AMBULATORY_CARE_PROVIDER_SITE_OTHER): Payer: BC Managed Care – PPO | Admitting: Family Medicine

## 2017-11-05 ENCOUNTER — Ambulatory Visit (INDEPENDENT_AMBULATORY_CARE_PROVIDER_SITE_OTHER): Payer: BC Managed Care – PPO | Admitting: Psychology

## 2017-11-05 VITALS — BP 157/75 | HR 68 | Temp 98.0°F | Ht 65.0 in | Wt 359.0 lb

## 2017-11-05 DIAGNOSIS — F3289 Other specified depressive episodes: Secondary | ICD-10-CM | POA: Diagnosis not present

## 2017-11-05 DIAGNOSIS — E119 Type 2 diabetes mellitus without complications: Secondary | ICD-10-CM | POA: Diagnosis not present

## 2017-11-05 DIAGNOSIS — I10 Essential (primary) hypertension: Secondary | ICD-10-CM | POA: Diagnosis not present

## 2017-11-05 DIAGNOSIS — E559 Vitamin D deficiency, unspecified: Secondary | ICD-10-CM | POA: Diagnosis not present

## 2017-11-05 DIAGNOSIS — Z9189 Other specified personal risk factors, not elsewhere classified: Secondary | ICD-10-CM

## 2017-11-05 DIAGNOSIS — Z6841 Body Mass Index (BMI) 40.0 and over, adult: Secondary | ICD-10-CM

## 2017-11-05 MED ORDER — METOPROLOL SUCCINATE ER 50 MG PO TB24: 50.0000 mg | ORAL_TABLET | Freq: Every day | ORAL | 0 refills | Status: DC

## 2017-11-05 MED ORDER — VITAMIN D (ERGOCALCIFEROL) 1.25 MG (50000 UNIT) PO CAPS: 50000.0000 [IU] | ORAL_CAPSULE | ORAL | 0 refills | Status: DC

## 2017-11-05 NOTE — Progress Notes (Addendum)
Office: [phone]  /  Fax: [phone] Date: November 05, 2017 Time Seen: 3:02pm Duration: 50 minutes Provider: Glennie Isle, PsyD Type of Session: Intake for Individual Therapy   Informed Consent:The provider's role was explained to Wanda [name]. The provider reviewed and discussed issues of confidentiality, privacy, and limits therein. Since the clinic is not a 24/7 crisis [name], mental health emergency resources were shared and a handout was provided. Wanda [name] verbally acknowledged understanding, and agreed to use mental health emergency resources discussed if needed. In addition to written consent, verbal informed consent for psychological services was obtained from Wanda [name] prior to the initial intake interview. Moreover, Wanda [name] agreed information may be shared with other [facility] providers as needed for coordination of care. Written consent was also provided for this provider to coordinate care with other providers at [facility].   Chief Complaint: Wanda [name] was referred by Wanda [name]. Per the note for the initial visit with Wanda [name] on October 20, 2017, "Wanda [name] had a positive depression screen with significant feelings of guilt and feeling out of control. She suspects that she is addicted to certain foods and she likes simple carbohydrates."  During today's appointment, Wanda [name] reported, "Dr. Leafy Morris wanted me to come as part of the initial process." She discussed struggling with her weight her whole life and she noted, "I don't want to try surgery." Wanda [name] reported she needs someone to hold her accountable. Regarding emotional eating, Wanda [name] noted she has not engaged in emotional eating since starting with the clinic, as she has a plan.   Wanda [name] was asked to complete a questionnaire assessing various behaviors related to emotional eating. Wanda [name] endorsed the following: overeat when you are celebrating, eat certain  foods when you are anxious, stressed, depressed, or your feelings are hurt, use food to help you cope with emotional situations, find food is comforting to you, overeat when you are angry or upset, overeat when you are worried about something, overeat frequently when you are bored or lonely and overeat when you are alone, but eat much less when you are with other people.  HPI: Per the note for the initial visit with Wanda [name] on October 20, 2017, Wanda [name] has been heavy most of her life and she started gaining weight after marriage and children. Her heaviest weight ever was 384 pounds.  During her initial appointment with Dr. Leafy Morris, Wanda [name] reported experiencing the following; significant food craving issues; frequently making poor food choices; frequently eating larger portions than normal; and struggling with emotional eating. During today' appointment, Wanda [name] reported emotional eating "probably" started when she was a teenager or young adult. She explained food was always a part of her family. Wanda [name] discussed going out to eat every Thursday night. She denied a history of binge eating. Wanda [name] denied a history of purging and engagement in other compensatory strategies. She denied ever being diagnosed with an eating disorder.   Mental Status Examination: Wanda [name] arrived on time for the appointment; however, the appointment was initiated two minutes late due to this provider. She presented as appropriately dressed and groomed. Wanda [name] appeared her stated age and demonstrated adequate orientation to time, place, person, and purpose of the appointment. She also demonstrated appropriate eye contact. No psychomotor abnormalities or behavioral peculiarities noted. Her mood was euthymic with congruent affect. Her thought processes were logical, linear, and goal-directed. No hallucinations, delusions, bizarre thinking or behavior reported or observed. Judgment, insight, and impulse control appeared to be  grossly intact.  There was no evidence of paraphasias (i.e., errors in speech, gross mispronunciations, and word substitutions), repetition deficits, or disturbances in volume or prosody (i.e., rhythm and intonation). There was no evidence of attention or memory impairments. [name] denied current suicidal and homicidal ideation, plan, and intent.   The Mini-Mental State Examination, Second Edition (MMSE-2) was administered. The MMSE-2 briefly screens for cognitive dysfunction and overall mental status and assesses different cognitive domains: orientation, registration, attention and calculation, recall, and language and praxis. [name] received 30 out of 30 points possible on the MMSE-2, which is noted in the normal range.   Family & Psychosocial History: Wanda [name] reported she has been married for 23 years and she has two children (ages 54 and 2). 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 She noted she has a bachelor's degree. Wanda [name] reported her social support system consists of her family, a few friends, and her dogs and cats. She identifies with Christianity.   Medical History:  Past Medical History:  Diagnosis Date  . Allergy   . Back pain   . CHF (congestive heart failure) ([place])   . Diabetes ([place])   . Dyspnea   . Gallbladder problem   . GERD (gastroesophageal reflux disease)   . Hip pain   . Hyperlipemia   . Hypertension   . Knee pain   . Leg edema   . Obesity   . Sleep apnea   . Venous insufficiency    Past Surgical History:  Procedure Laterality Date  . Birth Wanda [name] removed    . CHOLECYSTECTOMY    . INTRAUTERINE DEVICE INSERTION  12/31/2013   Mirena   Current Outpatient Medications on File Prior to Visit  Medication Sig Dispense Refill  . acetaminophen (TYLENOL) 500 MG tablet Take 500 mg by mouth every 6 (six) hours as needed.    . Ascorbic  Acid (VITAMIN C PO) Take by mouth.    [name] atorvastatin (LIPITOR) 20 MG tablet Take 1 tablet (20 mg total) by mouth daily. 90 tablet 3  . blood glucose meter kit and supplies Dispense based on patient and insurance preference. Use up to twice daily as directed. (FOR ICD-10 E10.9, E11.9). 1 each 0  . Cholecalciferol (VITAMIN D3 PO) Take by mouth.    . [employer] ASPIRIN LOW STRENGTH 81 MG EC tablet TAKE 1 TABLET BY MOUTH EVERY DAY 30 tablet 5  . fluticasone (FLONASE) 50 MCG/ACT nasal spray Place 1 spray into both nostrils 2 (two) times daily.     . furosemide (LASIX) 40 MG tablet TAKE 1 TABLET BY MOUTH EVERY DAY 90 tablet 1  . ibuprofen (ADVIL,MOTRIN) 200 MG tablet Take 200 mg by mouth every 6 (six) hours as needed.    . Lancets (ACCU-CHEK SAFE-T PRO) lancets Use as instructed 100 each 0  . levonorgestrel (MIRENA) 20 MCG/24HR IUD 1 each by Intrauterine route once.    [name] losartan (COZAAR) 50 MG tablet Take 1 tablet (50 mg total) by mouth daily. 90 tablet 3  . metFORMIN (GLUCOPHAGE) 1000 MG tablet TAKE 1 TABLET (1,000 MG TOTAL) BY MOUTH 2 (TWO) TIMES DAILY WITH A MEAL. 180 tablet 1  . Multiple Vitamins-Minerals (WOMENS DAILY FORMULA PO) Take 1 tablet by mouth daily.    [name]-Propyl Glycol (SYSTANE) 0.4-0.3 % SOLN Apply to eye.    . Pseudoephedrine-Acetaminophen (ALKA-SELTZER PLUS COLD/SINUS PO) Take by mouth.     Current  Facility-Administered Medications on File Prior to Visit  Medication Dose Route Frequency Provider Last Rate Last Dose  . levonorgestrel (MIRENA) 20 MCG/24HR IUD   Intrauterine Once [name], [name], MD      Shenaya denied a history of head injuries and loss of consciousness.   Mental Health History: Anndrea reported she saw a counselor one time after the birth of her second child due to "baby blues" and her weight. She denied a history of hospitalization for psychiatric concerns and has never seen a psychiatrist. She denied ever being prescribed any psychotropic medications.  Soo reported her oldest daughter has been diagnosed with Generalized Anxiety Disorder. She denied a trauma history, including sexual, psychological, and physical abuse, as well as neglect.  Roseline reported experiencing the following: stress at work and trouble relaxing. She denied experiencing the following: sleep difficulties; appetite changes; substance use; obsessions and compulsions; mania; hallucinations and delusions; attention and concentration issues; fatigue; history of and engagement in self-harm; and history of and current suicidal and homicidal ideation, plan, and intent. Evalee reported an increase in her energy [name] this week.   Structured Assessment Results: The Patient Health Questionnaire-9 (PHQ-9) is a self-report measure that assesses symptoms and severity of depression over the course of the last two weeks. Emmamarie obtained a score of zero. Depression screen [name] 2/9 11/05/2017  Decreased Interest 0  Down, Depressed, Hopeless 0  PHQ - 2 Score 0  Altered sleeping 0  Tired, decreased energy 0  Change in appetite 0  Feeling bad or failure about yourself  0  Trouble concentrating 0  Moving slowly or fidgety/restless 0  Suicidal thoughts 0  PHQ-9 Score 0  Difficult doing work/chores -   The Generalized Anxiety Disorder-7 (GAD-7) is a brief self-report measure that assesses symptoms of anxiety over the course of the last two weeks. Maniah obtained a score of two suggesting minimal anxiety. GAD 7 : Generalized Anxiety Score 11/05/2017  Nervous, Anxious, on Edge 1  Control/stop worrying 0  Worry too much - different things 0  Trouble relaxing 1  Restless 0  Easily annoyed or irritable 0  Afraid - awful might happen 0  Total GAD 7 Score 2  Anxiety Difficulty Not difficult at all   Interventions: A chart review was conducted prior to the clinical intake interview. The MMSE-2, PHQ-9, and GAD-7 were administered and a clinical intake interview was completed. In addition,  Mardi was asked to complete a Mood and Food questionnaire to assess various behaviors related to emotional eating. Throughout session, empathic reflections and validation was provided. Continuing treatment with this provider was discussed and a treatment goal was established. At this time, Fannye feels she has control over her emotional eating as she has been following the prescribed meal plan; however, she discussed in the past returning back to emotional eating due to decreased accountability. As such, this provider and Sharyl discussed scheduling an appointment in one month to conduct a check-in and continue building awareness surrounding emotional eating and discussing coping skills.   Psychoeducation regarding emotional versus physical hunger was provided. Royalti was given a handout to utilize between now and the next appointment to increase awareness of hunger patterns and subsequent eating. In addition, psychoeducation regarding triggers for emotional eating was provided. Gudelia was provided a handout, and encouraged to utilize the handout between now and the next appointment to increase awareness of triggers and frequency. Wanda [name] agreed.   Provisional DSM-5 Diagnosis: 311 (F32.8) Other Specified Depressive Disorder, Emotional Eating  Plan: Akayla  expressed understanding and agreement with the initial treatment plan of care. She appears able and willing to participate as evidenced by collaboration on a treatment goal, engagement in reciprocal conversation, and asking questions as needed for clarification. The next appointment will be scheduled in one month. The following treatment goal was established: decrease emotional eating.

## 2017-11-06 NOTE — Progress Notes (Signed)
Office: [phone]  /  Fax: [phone]   HPI:   Chief Complaint: OBESITY [name] is here to discuss her progress with her obesity treatment plan. She is on the Category 4 plan and is following her eating plan approximately 100 % of the time. She states she is exercising 0 minutes 0 times per week. [name] has done exceptionally well with weight loss on her Category 4 plan. She states she ate all of her food and her hunger was controlled. She notes cravings were not a challenge.  Her weight is (!) 359 lb (162.8 kg) today and has had a weight loss of 19 pounds over a period of 2 to 3 weeks since her last visit. She has lost 19 lbs since starting treatment with [place].  Vitamin D Deficiency [name] has a diagnosis of vitamin D deficiency. She is on OTC Vit D, but her level is not yet at goal. She denies nausea, vomiting or muscle weakness.  Diabetes II [name] has a diagnosis of diabetes type II. [name]'s last A1c was at 7.2, not yet controlled on metformin. Since starting her diet prescription her fasting BGs range mostly between 100 and 115, and 2 hour post prandials range between 96 and 145 which has improved. She denies any hypoglycemic episodes. She has been working on intensive lifestyle modifications including diet, exercise, and weight loss to help control her blood glucose levels.  Hypertension [name] is a 52 y.o. female with hypertension. [name] has had 3 blood pressure readings in a row, which were elevated. She is on Losartan and Lasix and denies chest pain or headache. She is working weight loss to help control her blood pressure with the goal of decreasing her risk of heart attack and stroke. [name]'s blood pressure is not currently controlled.  At risk for cardiovascular disease [name] is at a higher than average risk for cardiovascular disease due to obesity, diabetes II, and hypertension. She currently denies any chest pain.  ALLERGIES: No Known  Allergies  MEDICATIONS: Current Outpatient Medications on File Prior to Visit  Medication Sig Dispense Refill  . acetaminophen (TYLENOL) 500 MG tablet Take 500 mg by mouth every 6 (six) hours as needed.    . Ascorbic Acid (VITAMIN C PO) Take by mouth.    [name] atorvastatin (LIPITOR) 20 MG tablet Take 1 tablet (20 mg total) by mouth daily. 90 tablet 3  . blood glucose meter kit and supplies Dispense based on patient and insurance preference. Use up to twice daily as directed. (FOR ICD-10 E10.9, E11.9). 1 each 0  . Cholecalciferol (VITAMIN D3 PO) Take by mouth.    . [facility] ASPIRIN LOW STRENGTH 81 MG EC tablet TAKE 1 TABLET BY MOUTH EVERY DAY 30 tablet 5  . fluticasone (FLONASE) 50 MCG/ACT nasal spray Place 1 spray into both nostrils 2 (two) times daily.     . furosemide (LASIX) 40 MG tablet TAKE 1 TABLET BY MOUTH EVERY DAY 90 tablet 1  . ibuprofen (ADVIL,MOTRIN) 200 MG tablet Take 200 mg by mouth every 6 (six) hours as needed.    . Lancets (ACCU-CHEK SAFE-T PRO) lancets Use as instructed 100 each 0  . levonorgestrel (MIRENA) 20 MCG/24HR IUD 1 each by Intrauterine route once.    [name] losartan (COZAAR) 50 MG tablet Take 1 tablet (50 mg total) by mouth daily. 90 tablet 3  . metFORMIN (GLUCOPHAGE) 1000 MG tablet TAKE 1 TABLET (1,000 MG TOTAL) BY MOUTH 2 (TWO) TIMES DAILY WITH A MEAL. 180 tablet 1  .  Multiple Vitamins-Minerals (WOMENS DAILY FORMULA PO) Take 1 tablet by mouth daily.    [name] Glycol-Propyl Glycol (SYSTANE) 0.4-0.3 % SOLN Apply to eye.    . Pseudoephedrine-Acetaminophen (ALKA-SELTZER PLUS COLD/SINUS PO) Take by mouth.     Current Facility-Administered Medications on File Prior to Visit  Medication Dose Route Frequency Provider Last Rate Last Dose  . levonorgestrel (MIRENA) 20 MCG/24HR IUD   Intrauterine Once [name], [name], MD        PAST MEDICAL HISTORY: Past Medical History:  Diagnosis Date  . Allergy   . Back pain   . CHF (congestive heart failure) ([place])   . Diabetes ([place])    . Dyspnea   . Gallbladder problem   . GERD (gastroesophageal reflux disease)   . Hip pain   . Hyperlipemia   . Hypertension   . Knee pain   . Leg edema   . Obesity   . Sleep apnea   . Venous insufficiency     PAST SURGICAL HISTORY: Past Surgical History:  Procedure Laterality Date  . Birth Elta [place] removed    . CHOLECYSTECTOMY    . INTRAUTERINE DEVICE INSERTION  12/31/2013   Mirena    SOCIAL HISTORY: Social History   Tobacco Use  . Smoking status: Never Smoker  . Smokeless tobacco: Never Used  Substance Use Topics  . Alcohol use: Yes    Alcohol/week: 0.0 standard drinks    Comment: Rare  . Drug use: No    FAMILY HISTORY: Family History  Problem Relation Age of Onset  . Hypertension Father   . Diabetes Father   . Obesity Father   . Cancer Paternal Grandfather        Stomach  . Cancer Maternal Uncle        Pancreatic  . Cancer Paternal Uncle        Kidney  . Hypertension Mother   . Anxiety disorder Mother     ROS: Review of Systems  Constitutional: Positive for weight loss.  Cardiovascular: Negative for chest pain.  Gastrointestinal: Negative for nausea and vomiting.  Musculoskeletal:       Negative muscle weakness  Neurological: Negative for headaches.  Endo/Heme/Allergies:       Negative hypoglycemia    PHYSICAL EXAM: Blood pressure (!) 157/75, pulse 68, temperature 98 F (36.7 C), temperature source Oral, height '5\' 5"'  (1.651 m), weight (!) 359 lb (162.8 kg), SpO2 96 %. Body mass index is 59.74 kg/m. Physical Exam  Constitutional: She is oriented to person, place, and time. She appears well-developed and well-nourished.  Cardiovascular: Normal rate.  Pulmonary/Chest: Effort normal.  Musculoskeletal: Normal range of motion.  Neurological: She is oriented to person, place, and time.  Skin: Skin is warm and dry.  Psychiatric: She has a normal mood and affect. Her behavior is normal.  Vitals reviewed.   RECENT LABS AND TESTS: BMET     Component Value Date/Time   NA 142 10/20/2017 0940   K 4.1 10/20/2017 0940   CL 100 10/20/2017 0940   CO2 28 10/20/2017 0940   GLUCOSE 101 (H) 10/20/2017 0940   GLUCOSE 115 (H) 04/10/2017 1656   BUN 11 10/20/2017 0940   CREATININE 0.56 (L) 10/20/2017 0940   CALCIUM 9.2 10/20/2017 0940   GFRNONAA 108 10/20/2017 0940   GFRAA 124 10/20/2017 0940   Lab Results  Component Value Date   HGBA1C 7.2 (H) 10/20/2017   HGBA1C 7.3 (H) 07/14/2017   HGBA1C 7.7 (H) 04/03/2017   HGBA1C 7.4 (H) 09/13/2016  HGBA1C 7.8 (H) 04/12/2016   Lab Results  Component Value Date   INSULIN 9.1 10/20/2017   CBC    Component Value Date/Time   WBC 7.9 10/20/2017 0940   WBC 10.6 (H) 04/05/2017 0443   RBC 5.35 (H) 10/20/2017 0940   RBC 5.16 (H) 04/05/2017 0443   HGB 15.0 10/20/2017 0940   HCT 48.0 (H) 10/20/2017 0940   PLT 265 04/05/2017 0443   MCV 90 10/20/2017 0940   MCH 28.0 10/20/2017 0940   MCH 27.3 04/05/2017 0443   MCHC 31.3 (L) 10/20/2017 0940   MCHC 29.4 (L) 04/05/2017 0443   RDW 16.0 (H) 10/20/2017 0940   LYMPHSABS 1.2 10/20/2017 0940   MONOABS 0.7 04/05/2017 0443   EOSABS 0.1 10/20/2017 0940   BASOSABS 0.1 10/20/2017 0940   Iron/TIBC/Ferritin/ %Sat No results found for: IRON, TIBC, FERRITIN, IRONPCTSAT Lipid Panel     Component Value Date/Time   CHOL 127 10/20/2017 0940   TRIG 91 10/20/2017 0940   HDL 47 10/20/2017 0940   CHOLHDL 3 09/13/2016 0958   VLDL 21.8 09/13/2016 0958   LDLCALC 62 10/20/2017 0940   Hepatic Function Panel     Component Value Date/Time   PROT 7.1 10/20/2017 0940   ALBUMIN 4.1 10/20/2017 0940   AST 22 10/20/2017 0940   ALT 27 10/20/2017 0940   ALKPHOS 130 (H) 10/20/2017 0940   BILITOT 0.6 10/20/2017 0940      Component Value Date/Time   TSH 2.790 10/20/2017 0940  Results for NAYDELIN, ZIEGLER (MRN [id number]) as of 11/06/2017 10:47  Ref. Range 10/20/2017 09:40  Vitamin D, 25-Hydroxy Latest Ref Range: 30.0 - 100.0 ng/mL 39.0    ASSESSMENT AND  PLAN: Vitamin D deficiency - Plan: Vitamin D, Ergocalciferol, (DRISDOL) 50000 units CAPS capsule  Type 2 diabetes mellitus without complication, without long-term current use of insulin (HCC)  Essential hypertension - Plan: metoprolol succinate (TOPROL-XL) 50 MG 24 hr tablet  At risk for heart disease  Class 3 severe obesity with serious comorbidity and body mass index (BMI) of 50.0 to 59.9 in adult, unspecified obesity type ([place])  PLAN:  Vitamin D Deficiency [name] was informed that low vitamin D levels contributes to fatigue and are associated with obesity, breast, and colon cancer. [name] agrees to discontinue OTC Vit D, and she agrees to start prescription Vit D '@50' ,000 IU every week #4 with no refills. She will follow up for routine testing of vitamin D, at least 2-3 times per year. She was informed of the risk of over-replacement of vitamin D and agrees to not increase her dose unless she discusses this with [place] first. [name] agrees to follow up with our clinic in 2 weeks.  Diabetes II [name] has been given extensive diabetes education by myself today including ideal fasting and post-prandial blood glucose readings, individual ideal Hgb A1c goals and hypoglycemia prevention. We discussed the importance of good blood sugar control to decrease the likelihood of diabetic complications such as nephropathy, neuropathy, limb loss, blindness, coronary artery disease, and death. We discussed the importance of intensive lifestyle modification including diet, exercise and weight loss as the first line treatment for diabetes. [name] agrees to continue taking metformin and diet, and we will recheck labs in 3 months. [name] agrees to follow up with our clinic in 2 weeks.  Hypertension We discussed sodium restriction, working on healthy weight loss, and a regular exercise program as the means to achieve improved blood pressure control. [name] agreed with this plan and agreed to follow  up as  directed. We will continue to monitor her blood pressure as well as her progress with the above lifestyle modifications. [name] agrees to continue taking Losartan as is, and she agrees to start metoprolol ER 50 mg qhs #30 with no refills. She will watch for signs of hypotension as she continues her lifestyle modifications. [name] agrees to follow up with our clinic in 2 weeks.  Cardiovascular risk counselling [name] was given extended (30 minutes) coronary artery disease prevention counseling today. She is 52 y.o. female and has risk factors for heart disease including obesity, diabetes II, and hypertension. We discussed intensive lifestyle modifications today with an emphasis on specific weight loss instructions and strategies. Pt was also informed of the importance of increasing exercise and decreasing saturated fats to help prevent heart disease.  Obesity [name] is currently in the action stage of change. As such, her goal is to continue with weight loss efforts She has agreed to follow the Category 4 plan + 100-200 specific calories [name] has been instructed to work up to a goal of 150 minutes of combined cardio and strengthening exercise per week for weight loss and overall health benefits. We discussed the following Behavioral Modification Strategies today: increasing lean protein intake, decreasing simple carbohydrates  and work on meal planning and easy cooking plans   [name] has agreed to follow up with our clinic in 2 weeks. She was informed of the importance of frequent follow up visits to maximize her success with intensive lifestyle modifications for her multiple health conditions.   OBESITY BEHAVIORAL INTERVENTION VISIT  Today's visit was # 2   Starting weight: 378 lbs Starting date: 10/20/17 Today's weight : 359 lbs Today's date: 11/05/2017 Total lbs lost to date: 63    ASK: We discussed the diagnosis of obesity with [name] today and [name] agreed to  give [place] permission to discuss obesity behavioral modification therapy today.  ASSESS: [name] has the diagnosis of obesity and her BMI today is 59.74 [name] is in the action stage of change   ADVISE: Maidie was educated on the multiple health risks of obesity as well as the benefit of weight loss to improve her health. She was advised of the need for long term treatment and the importance of lifestyle modifications to improve her current health and to decrease her risk of future health problems.  AGREE: Multiple dietary modification options and treatment options were discussed and  Twylla agreed to follow the recommendations documented in the above note.  ARRANGE: Sansa was educated on the importance of frequent visits to treat obesity as outlined per CMS and USPSTF guidelines and agreed to schedule her next follow up appointment today.  I, Trixie Dredge, am acting as transcriptionist for Dennard Nip, MD  I have reviewed the above documentation for accuracy and completeness, and I agree with the above. -Dennard Nip, MD

## 2017-11-11 NOTE — Progress Notes (Signed)
HPI:  Using dictation device. Unfortunately this device frequently misinterprets words/phrases.  [name] is a pleasant 52 y.o. here for follow up. Chronic medical problems summarized below were reviewed for changes and stability and were updated as needed below. These issues and their treatment remain stable for the most part.  She had all of her labs done recently with the [facility].  Seems they have been managing most of her problems recently.Denies CP, SOB, DOE, treatment intolerance or new symptoms. She is due for flu shot, colon cancer screening. Doing well on CPAP.   She sees Dr. Phineas Real in gynecology for her women's health.  She had her last visit just recently in September.  Per GYN notes she is up-to-date on her Pap smear and not due.  He plans to do a Pap smear at 5-year intervals.  Her GYN notes her mammogram is up-to-date.    Morbid obesity: -seeing Dr. Leafy Ro at the [facility] -has lost some weight recently, 19 lbs -wt 386 (9/16) --> 383 (8/18) --> 357 (9/19)  MDD, recurrent: -long standing, recurrent depressed mood -see phq9 -seeing Dr. Mallie Mussel in psychiatry for managment  HTN w/ diabetes: -meds: losartan, metoprolol recently added by Dr. Leafy Ro, lasix  Hyperlip with diabetes: -meds: atorvastatin  OSA: -tested in 2019, now on CPAP  Diabetes: -with hyperglycemia and hyperlipemia and hypertension -meds: metformin -working on diet with obesity clinic  Vit D def: -seeing Dr. Leafy Ro for this and taking vit D  ROS: See pertinent positives and negatives per HPI.  Past Medical History:  Diagnosis Date  . Allergy   . Back pain   . CHF (congestive heart failure) ([place])   . Diabetes ([place])   . Dyspnea   . Gallbladder problem   . GERD (gastroesophageal reflux disease)   . Hip pain   . Hyperlipemia   . Hypertension   . Knee pain   . Leg edema   . Obesity   . Sleep apnea   . Venous insufficiency      Past Surgical History:  Procedure Laterality Date  . Birth Elta [place] removed    . CHOLECYSTECTOMY    . INTRAUTERINE DEVICE INSERTION  12/31/2013   Mirena    Family History  Problem Relation Age of Onset  . Hypertension Father   . Diabetes Father   . Obesity Father   . Cancer Paternal Grandfather        Stomach  . Cancer Maternal Uncle        Pancreatic  . Cancer Paternal Uncle        Kidney  . Hypertension Mother   . Anxiety disorder Mother     SOCIAL HX: see hpi   Current Outpatient Medications:  .  Ascorbic Acid (VITAMIN C PO), Take by mouth., Disp: , Rfl:  .  atorvastatin (LIPITOR) 20 MG tablet, Take 1 tablet (20 mg total) by mouth daily., Disp: 90 tablet, Rfl: 3 .  blood glucose meter kit and supplies, Dispense based on patient and insurance preference. Use up to twice daily as directed. (FOR ICD-10 E10.9, E11.9)., Disp: 1 each, Rfl: 0 .  [facility] ASPIRIN LOW STRENGTH 81 MG EC tablet, TAKE 1 TABLET BY MOUTH EVERY DAY, Disp: 30 tablet, Rfl: 5 .  fluticasone (FLONASE) 50 MCG/ACT nasal spray, Place 1 spray into both nostrils 2 (two) times daily. , Disp: , Rfl:  .  furosemide (LASIX) 40 MG tablet, TAKE 1 TABLET BY MOUTH EVERY DAY, Disp: 90 tablet, Rfl:  1 .  ibuprofen (ADVIL,MOTRIN) 200 MG tablet, Take 200 mg by mouth every 6 (six) hours as needed., Disp: , Rfl:  .  Lancets (ACCU-CHEK SAFE-T PRO) lancets, Use as instructed, Disp: 100 each, Rfl: 0 .  levonorgestrel (MIRENA) 20 MCG/24HR IUD, 1 each by Intrauterine route once., Disp: , Rfl:  .  losartan (COZAAR) 50 MG tablet, Take 1 tablet (50 mg total) by mouth daily., Disp: 90 tablet, Rfl: 3 .  metFORMIN (GLUCOPHAGE) 1000 MG tablet, TAKE 1 TABLET (1,000 MG TOTAL) BY MOUTH 2 (TWO) TIMES DAILY WITH A MEAL., Disp: 180 tablet, Rfl: 1 .  metoprolol succinate (TOPROL-XL) 50 MG 24 hr tablet, Take 1 tablet (50 mg total) by mouth daily. Take with or immediately following a meal., Disp: 30 tablet, Rfl: 0 .  Multiple Vitamins-Minerals (WOMENS  DAILY FORMULA PO), Take 1 tablet by mouth daily., Disp: , Rfl:  .  Polyethyl Glycol-Propyl Glycol (SYSTANE) 0.4-0.3 % SOLN, Apply to eye., Disp: , Rfl:  .  Pseudoephedrine-Acetaminophen (ALKA-SELTZER PLUS COLD/SINUS PO), Take by mouth., Disp: , Rfl:  .  Vitamin D, Ergocalciferol, (DRISDOL) 50000 units CAPS capsule, Take 1 capsule (50,000 Units total) by mouth every 7 (seven) days., Disp: 4 capsule, Rfl: 0  Current Facility-Administered Medications:  .  levonorgestrel (MIRENA) 20 MCG/24HR IUD, , Intrauterine, Once, [name], Timothy P, MD  EXAM:  Vitals:   11/13/17 0727  BP: 116/70  Pulse: 71  Temp: 98.4 F (36.9 C)    Body mass index is 59.56 kg/m.  GENERAL: vitals reviewed and listed above, alert, oriented, appears well hydrated and in no acute distress  HEENT: atraumatic, conjunttiva clear, no obvious abnormalities on inspection of external nose and ears  NECK: no obvious masses on inspection  LUNGS: clear to auscultation bilaterally, no wheezes, rales or rhonchi, good air movement  CV: HRRR, no peripheral edema  MS: moves all extremities without noticeable abnormality  PSYCH: pleasant and cooperative, no obvious depression or anxiety  ASSESSMENT AND PLAN:  Discussed the following assessment and plan:  Essential hypertension  Type 2 diabetes mellitus without complication, without long-term current use of insulin (HCC)  Acute congestive heart failure, unspecified heart failure type ([place])  Sleep apnea, unspecified type  Morbid obesity ([place])  -congratulated on changes -doing great -on CPAP now -flu shot today -colon cancer screening updated -see PHQ9 -Patient advised to return or notify a doctor immediately if symptoms worsen or persist or new concerns arise.  Patient Instructions  BEFORE YOU LEAVE: -PHQ9 -flu shot -follow up: 3-4 months  Keep up the great work!    Lucretia Kern, DO

## 2017-11-13 ENCOUNTER — Ambulatory Visit: Payer: BC Managed Care – PPO | Admitting: Family Medicine

## 2017-11-13 ENCOUNTER — Encounter: Payer: Self-pay | Admitting: Family Medicine

## 2017-11-13 VITALS — BP 116/70 | HR 71 | Temp 98.4°F | Ht 65.0 in | Wt 357.9 lb

## 2017-11-13 DIAGNOSIS — I10 Essential (primary) hypertension: Secondary | ICD-10-CM

## 2017-11-13 DIAGNOSIS — G4730 Sleep apnea, unspecified: Secondary | ICD-10-CM | POA: Diagnosis not present

## 2017-11-13 DIAGNOSIS — E119 Type 2 diabetes mellitus without complications: Secondary | ICD-10-CM | POA: Diagnosis not present

## 2017-11-13 DIAGNOSIS — I509 Heart failure, unspecified: Secondary | ICD-10-CM | POA: Diagnosis not present

## 2017-11-13 DIAGNOSIS — Z23 Encounter for immunization: Secondary | ICD-10-CM

## 2017-11-13 NOTE — Patient Instructions (Addendum)
BEFORE YOU LEAVE: -PHQ9 -flu shot -follow up: 3-4 months  Keep up the great work!

## 2017-11-13 NOTE — Addendum Note (Signed)
Addended by: Johnella Moloney on: 11/13/2017 08:03 AM   Modules accepted: Orders

## 2017-11-20 ENCOUNTER — Other Ambulatory Visit: Payer: Self-pay | Admitting: Family Medicine

## 2017-11-24 ENCOUNTER — Ambulatory Visit (INDEPENDENT_AMBULATORY_CARE_PROVIDER_SITE_OTHER): Payer: BC Managed Care – PPO | Admitting: Family Medicine

## 2017-11-24 VITALS — BP 124/75 | HR 73 | Temp 98.0°F | Ht 65.0 in | Wt 350.0 lb

## 2017-11-24 DIAGNOSIS — E119 Type 2 diabetes mellitus without complications: Secondary | ICD-10-CM | POA: Diagnosis not present

## 2017-11-24 DIAGNOSIS — I10 Essential (primary) hypertension: Secondary | ICD-10-CM

## 2017-11-24 DIAGNOSIS — E559 Vitamin D deficiency, unspecified: Secondary | ICD-10-CM

## 2017-11-24 DIAGNOSIS — Z6841 Body Mass Index (BMI) 40.0 and over, adult: Secondary | ICD-10-CM

## 2017-11-24 MED ORDER — VITAMIN D (ERGOCALCIFEROL) 1.25 MG (50000 UNIT) PO CAPS: 50000.0000 [IU] | ORAL_CAPSULE | ORAL | 0 refills | Status: DC

## 2017-11-24 MED ORDER — METOPROLOL SUCCINATE ER 50 MG PO TB24: 50.0000 mg | ORAL_TABLET | Freq: Every day | ORAL | 0 refills | Status: DC

## 2017-11-26 NOTE — Progress Notes (Signed)
Office: [phone]  /  Fax: [phone]   HPI:   Chief Complaint: OBESITY [name] is here to discuss her progress with her obesity treatment plan. She is on the Category 4 plan + 100-200 extra calories and is following her eating plan approximately 98 % of the time. She states she is exercising 0 minutes 0 times per week. [name] continues to do well with weight loss on her eating plan. Her hunger is controlled and she is doing well with avoiding temptations.  Her weight is (!) 350 lb (158.8 kg) today and has had a weight loss of 9 pounds over a period of 2 to 3 weeks since her last visit. She has lost 28 lbs since starting treatment with [place].  Diabetes II [name] has a diagnosis of diabetes type II. [name] states fasting BGs range between 98 and 135, and last A1c was 7.2. She is doing better with diet and medications. She denies any hypoglycemic episodes. She has been working on intensive lifestyle modifications including diet, exercise, and weight loss to help control her blood glucose levels.  Hypertension [name] is a 52 y.o. female with hypertension. [name]'s blood pressure is controlled on medications. She denies chest pain or headaches. She is doing well on diet and working on weight loss to help control her blood pressure with the goal of decreasing her risk of heart attack and stroke.   Vitamin D Deficiency [name] has a diagnosis of vitamin D deficiency. She is stable on prescription Vit D, but level is not yet at goal. She denies nausea, vomiting or muscle weakness.  ALLERGIES: No Known Allergies  MEDICATIONS: Current Outpatient Medications on File Prior to Visit  Medication Sig Dispense Refill  . Ascorbic Acid (VITAMIN C PO) Take by mouth.    [name] atorvastatin (LIPITOR) 20 MG tablet Take 1 tablet (20 mg total) by mouth daily. 90 tablet 3  . blood glucose meter kit and supplies Dispense based on patient and insurance preference. Use up to twice daily as  directed. (FOR ICD-10 E10.9, E11.9). 1 each 0  . [facility] ASPIRIN LOW STRENGTH 81 MG EC tablet TAKE 1 TABLET BY MOUTH EVERY DAY 90 tablet 1  . fluticasone (FLONASE) 50 MCG/ACT nasal spray Place 1 spray into both nostrils 2 (two) times daily.     . furosemide (LASIX) 40 MG tablet TAKE 1 TABLET BY MOUTH EVERY DAY 90 tablet 1  . ibuprofen (ADVIL,MOTRIN) 200 MG tablet Take 200 mg by mouth every 6 (six) hours as needed.    . Lancets (ACCU-CHEK SAFE-T PRO) lancets Use as instructed 100 each 0  . levonorgestrel (MIRENA) 20 MCG/24HR IUD 1 each by Intrauterine route once.    [name] losartan (COZAAR) 50 MG tablet Take 1 tablet (50 mg total) by mouth daily. 90 tablet 3  . metFORMIN (GLUCOPHAGE) 1000 MG tablet TAKE 1 TABLET (1,000 MG TOTAL) BY MOUTH 2 (TWO) TIMES DAILY WITH A MEAL. 180 tablet 1  . Multiple Vitamins-Minerals (WOMENS DAILY FORMULA PO) Take 1 tablet by mouth daily.    [name] Glycol-Propyl Glycol (SYSTANE) 0.4-0.3 % SOLN Apply to eye.    . Pseudoephedrine-Acetaminophen (ALKA-SELTZER PLUS COLD/SINUS PO) Take by mouth.     Current Facility-Administered Medications on File Prior to Visit  Medication Dose Route Frequency Provider Last Rate Last Dose  . levonorgestrel (MIRENA) 20 MCG/24HR IUD   Intrauterine Once [name], [name], MD        PAST MEDICAL HISTORY: Past Medical History:  Diagnosis Date  .  Allergy   . Back pain   . CHF (congestive heart failure) ([place])   . Diabetes ([place])   . Dyspnea   . Gallbladder problem   . GERD (gastroesophageal reflux disease)   . Hip pain   . Hyperlipemia   . Hypertension   . Knee pain   . Leg edema   . Obesity   . Sleep apnea   . Venous insufficiency     PAST SURGICAL HISTORY: Past Surgical History:  Procedure Laterality Date  . Birth [place]ta [place] removed    . CHOLECYSTECTOMY    . INTRAUTERINE DEVICE INSERTION  12/31/2013   Mirena    SOCIAL HISTORY: Social History   Tobacco Use  . Smoking status: Never Smoker  . Smokeless tobacco: Never Used    Substance Use Topics  . Alcohol use: Yes    Alcohol/week: 0.0 standard drinks    Comment: Rare  . Drug use: No    FAMILY HISTORY: Family History  Problem Relation Age of Onset  . Hypertension Father   . Diabetes Father   . Obesity Father   . Cancer Paternal Grandfather        Stomach  . Cancer Maternal Uncle        Pancreatic  . Cancer Paternal Uncle        Kidney  . Hypertension Mother   . Anxiety disorder Mother     ROS: Review of Systems  Constitutional: Positive for weight loss.  Cardiovascular: Negative for chest pain.  Gastrointestinal: Negative for nausea and vomiting.  Musculoskeletal:       Negative muscle weakness  Neurological: Negative for headaches.  Endo/Heme/Allergies:       Negative hypoglycemia    PHYSICAL EXAM: Blood pressure 124/75, pulse 73, temperature 98 F (36.7 C), temperature source Oral, height _0  (1.651 m), weight (!) 350 lb (158.8 kg), SpO2 97 %. Body mass index is 58.24 kg/m. Physical Exam  Constitutional: She is oriented to person, place, and time. She appears well-developed and well-nourished.  Cardiovascular: Normal rate.  Pulmonary/Chest: Effort normal.  Musculoskeletal: Normal range of motion.  Neurological: She is oriented to person, place, and time.  Skin: Skin is warm and dry.  Psychiatric: She has a normal mood and affect. Her behavior is normal.  Vitals reviewed.   RECENT LABS AND TESTS: BMET    Component Value Date/Time   NA 142 10/20/2017 0940   K 4.1 10/20/2017 0940   CL 100 10/20/2017 0940   CO2 28 10/20/2017 0940   GLUCOSE 101 (H) 10/20/2017 0940   GLUCOSE 115 (H) 04/10/2017 1656   BUN 11 10/20/2017 0940   CREATININE 0.56 (L) 10/20/2017 0940   CALCIUM 9.2 10/20/2017 0940   GFRNONAA 108 10/20/2017 0940   GFRAA 124 10/20/2017 0940   Lab Results  Component Value Date   HGBA1C 7.2 (H) 10/20/2017   HGBA1C 7.3 (H) 07/14/2017   HGBA1C 7.7 (H) 04/03/2017   HGBA1C 7.4 (H) 09/13/2016   HGBA1C 7.8 (H)  04/12/2016   Lab Results  Component Value Date   INSULIN 9.1 10/20/2017   CBC    Component Value Date/Time   WBC 7.9 10/20/2017 0940   WBC 10.6 (H) 04/05/2017 0443   RBC 5.35 (H) 10/20/2017 0940   RBC 5.16 (H) 04/05/2017 0443   HGB 15.0 10/20/2017 0940   HCT 48.0 (H) 10/20/2017 0940   PLT 265 04/05/2017 0443   MCV 90 10/20/2017 0940   MCH 28.0 10/20/2017 0940   MCH 27.3 04/05/2017 0443  MCHC 31.3 (L) 10/20/2017 0940   MCHC 29.4 (L) 04/05/2017 0443   RDW 16.0 (H) 10/20/2017 0940   LYMPHSABS 1.2 10/20/2017 0940   MONOABS 0.7 04/05/2017 0443   EOSABS 0.1 10/20/2017 0940   BASOSABS 0.1 10/20/2017 0940   Iron/TIBC/Ferritin/ %Sat No results found for: IRON, TIBC, FERRITIN, IRONPCTSAT Lipid Panel     Component Value Date/Time   CHOL 127 10/20/2017 0940   TRIG 91 10/20/2017 0940   HDL 47 10/20/2017 0940   CHOLHDL 3 09/13/2016 0958   VLDL 21.8 09/13/2016 0958   LDLCALC 62 10/20/2017 0940   Hepatic Function Panel     Component Value Date/Time   PROT 7.1 10/20/2017 0940   ALBUMIN 4.1 10/20/2017 0940   AST 22 10/20/2017 0940   ALT 27 10/20/2017 0940   ALKPHOS 130 (H) 10/20/2017 0940   BILITOT 0.6 10/20/2017 0940      Component Value Date/Time   TSH 2.790 10/20/2017 0940  Results for AUTYMN, OMLOR (MRN [id number]) as of 11/26/2017 12:09  Ref. Range 10/20/2017 09:40  Vitamin D, 25-Hydroxy Latest Ref Range: 30.0 - 100.0 ng/mL 39.0    ASSESSMENT AND PLAN: Type 2 diabetes mellitus without complication, without long-term current use of insulin (HCC)  Essential hypertension - Plan: metoprolol succinate (TOPROL-XL) 50 MG 24 hr tablet  Vitamin D deficiency - Plan: Vitamin D, Ergocalciferol, (DRISDOL) 50000 units CAPS capsule  Class 3 severe obesity with serious comorbidity and body mass index (BMI) of 50.0 to 59.9 in adult, unspecified obesity type ([place])  PLAN:  Diabetes II Tamea has been given extensive diabetes education by myself today including ideal  fasting and post-prandial blood glucose readings, individual ideal Hgb A1c goals and hypoglycemia prevention. We discussed the importance of good blood sugar control to decrease the likelihood of diabetic complications such as nephropathy, neuropathy, limb loss, blindness, coronary artery disease, and death. We discussed the importance of intensive lifestyle modification including diet, exercise and weight loss as the first line treatment for diabetes. Lenor agrees to continue her diabetes medications, diet, and exercise, and she agrees to follow up with our clinic in 2 to 3 weeks.  Hypertension We discussed sodium restriction, working on healthy weight loss, and a regular exercise program as the means to achieve improved blood pressure control. Adalynd agreed with this plan and agreed to follow up as directed. We will continue to monitor her blood pressure as well as her progress with the above lifestyle modifications. Annette agrees to continue taking metoprolol 50 mg qd #30 and we will refill for 1 month. She will continue with diet and exercise, and will watch for signs of hypotension as she continues her lifestyle modifications. Adaline agrees to follow up with our clinic in 2 to 3 weeks.  Vitamin D Deficiency Devetta was informed that low vitamin D levels contributes to fatigue and are associated with obesity, breast, and colon cancer. Aubrielle agrees to continue taking prescription Vit D _0 ,000 IU every week #4 and we will refill for 1 month. She will follow up for routine testing of vitamin D, at least 2-3 times per year. She was informed of the risk of over-replacement of vitamin D and agrees to not increase her dose unless she discusses this with [place] first. We will recheck labs in 2 months. Callista agrees to follow up with our clinic in 2 to 3 weeks.  Obesity Onica is currently in the action stage of change. As such, her goal is to continue with weight loss efforts She has  agreed to follow  the Category 4 plan + 100-200 extra calories Lylee has been instructed to work up to a goal of 150 minutes of combined cardio and strengthening exercise per week for weight loss and overall health benefits. We discussed the following Behavioral Modification Strategies today: increasing lean protein intake, decreasing simple carbohydrates, increasing vegetables, work on meal planning and easy cooking plans, dealing with family or coworker sabotage, and travel eating strategies    Bindu has agreed to follow up with our clinic in 2 to 3 weeks. She was informed of the importance of frequent follow up visits to maximize her success with intensive lifestyle modifications for her multiple health conditions.   OBESITY BEHAVIORAL INTERVENTION VISIT  Today's visit was # 4   Starting weight: 378 lbs Starting date: 10/20/17 Today's weight : 350 lbs Today's date: 11/24/2017 Total lbs lost to date: 48    ASK: We discussed the diagnosis of obesity with [name] today and Julianny agreed to give [place] permission to discuss obesity behavioral modification therapy today.  ASSESS: Deserae has the diagnosis of obesity and her BMI today is 58.24 Malcolm is in the action stage of change   ADVISE: Ondrea was educated on the multiple health risks of obesity as well as the benefit of weight loss to improve her health. She was advised of the need for long term treatment and the importance of lifestyle modifications to improve her current health and to decrease her risk of future health problems.  AGREE: Multiple dietary modification options and treatment options were discussed and  Darilyn agreed to follow the recommendations documented in the above note.  ARRANGE: Haislee was educated on the importance of frequent visits to treat obesity as outlined per CMS and USPSTF guidelines and agreed to schedule her next follow up appointment today.  I, Trixie Dredge, am acting as transcriptionist for  Dennard Nip, MD  I have reviewed the above documentation for accuracy and completeness, and I agree with the above. -Dennard Nip, MD

## 2017-11-27 ENCOUNTER — Other Ambulatory Visit (INDEPENDENT_AMBULATORY_CARE_PROVIDER_SITE_OTHER): Payer: Self-pay | Admitting: Family Medicine

## 2017-11-27 DIAGNOSIS — I10 Essential (primary) hypertension: Secondary | ICD-10-CM

## 2017-11-27 DIAGNOSIS — E559 Vitamin D deficiency, unspecified: Secondary | ICD-10-CM

## 2017-11-30 ENCOUNTER — Encounter (INDEPENDENT_AMBULATORY_CARE_PROVIDER_SITE_OTHER): Payer: Self-pay | Admitting: Family Medicine

## 2017-12-01 ENCOUNTER — Other Ambulatory Visit (INDEPENDENT_AMBULATORY_CARE_PROVIDER_SITE_OTHER): Payer: Self-pay | Admitting: Family Medicine

## 2017-12-02 ENCOUNTER — Other Ambulatory Visit (INDEPENDENT_AMBULATORY_CARE_PROVIDER_SITE_OTHER): Payer: Self-pay | Admitting: Family Medicine

## 2017-12-02 DIAGNOSIS — I10 Essential (primary) hypertension: Secondary | ICD-10-CM

## 2017-12-02 DIAGNOSIS — E559 Vitamin D deficiency, unspecified: Secondary | ICD-10-CM

## 2017-12-03 ENCOUNTER — Encounter: Payer: Self-pay | Admitting: Family Medicine

## 2017-12-04 ENCOUNTER — Ambulatory Visit (INDEPENDENT_AMBULATORY_CARE_PROVIDER_SITE_OTHER): Payer: Self-pay | Admitting: Psychology

## 2017-12-08 ENCOUNTER — Other Ambulatory Visit: Payer: Self-pay | Admitting: Family Medicine

## 2017-12-09 ENCOUNTER — Other Ambulatory Visit (INDEPENDENT_AMBULATORY_CARE_PROVIDER_SITE_OTHER): Payer: Self-pay | Admitting: Family Medicine

## 2017-12-09 DIAGNOSIS — E119 Type 2 diabetes mellitus without complications: Secondary | ICD-10-CM

## 2017-12-15 ENCOUNTER — Ambulatory Visit (INDEPENDENT_AMBULATORY_CARE_PROVIDER_SITE_OTHER): Payer: BC Managed Care – PPO | Admitting: Family Medicine

## 2017-12-15 ENCOUNTER — Ambulatory Visit: Payer: BC Managed Care – PPO | Admitting: Cardiology

## 2017-12-15 VITALS — BP 131/76 | HR 66 | Temp 97.9°F | Ht 65.0 in | Wt 336.0 lb

## 2017-12-15 DIAGNOSIS — E119 Type 2 diabetes mellitus without complications: Secondary | ICD-10-CM

## 2017-12-15 DIAGNOSIS — E559 Vitamin D deficiency, unspecified: Secondary | ICD-10-CM | POA: Diagnosis not present

## 2017-12-15 DIAGNOSIS — Z9189 Other specified personal risk factors, not elsewhere classified: Secondary | ICD-10-CM | POA: Diagnosis not present

## 2017-12-15 DIAGNOSIS — Z6841 Body Mass Index (BMI) 40.0 and over, adult: Secondary | ICD-10-CM

## 2017-12-15 MED ORDER — VITAMIN D (ERGOCALCIFEROL) 1.25 MG (50000 UNIT) PO CAPS: 50000.0000 [IU] | ORAL_CAPSULE | ORAL | 0 refills | Status: DC

## 2017-12-15 NOTE — Progress Notes (Signed)
Office: [phone]  /  Fax: [phone]   HPI:   Chief Complaint: OBESITY [name] is here to discuss her progress with her obesity treatment plan. She is on the Category 4 plan and is following her eating plan approximately 98 to 99 % of the time. She states she is exercising 0 minutes 0 times per week. [name] continues to do very well on her Category 4 (1800 to 1900 calories) plan. Her hunger is controlled and she is doing well with meal planning and temptations.  Her weight is (!) 336 lb (152.4 kg) today and has had a weight loss of 14 pounds over a period of 2 weeks since her last visit. She has lost 42 lbs since starting treatment with [place].  Vitamin D deficiency [name] has a diagnosis of vitamin D deficiency. She is currently stable, but not yet at goal. She is taking vit D and denies nausea, vomiting, or muscle weakness.  Diabetes II [name] has a diagnosis of diabetes type II. [name] had a spike in her blood glucoses after a steroid injection, up to 195, when normally her fasting and 2 hour postprandial BG range is 90 to 140's with her diet. Her last A1c was 7.2 on 10/20/17. She has been working on intensive lifestyle modifications including diet, exercise, and weight loss to help control her blood glucose levels.  At risk for cardiovascular disease [name] is at a higher than average risk for cardiovascular disease due to diabetes and obesity.   ALLERGIES: No Known Allergies  MEDICATIONS: Current Outpatient Medications on File Prior to Visit  Medication Sig Dispense Refill  . Ascorbic Acid (VITAMIN C PO) Take by mouth.    [name] atorvastatin (LIPITOR) 20 MG tablet Take 1 tablet (20 mg total) by mouth daily. 90 tablet 3  . blood glucose meter kit and supplies Dispense based on patient and insurance preference. Use up to twice daily as directed. (FOR ICD-10 E10.9, E11.9). 1 each 0  . [facility] ASPIRIN LOW STRENGTH 81 MG EC tablet TAKE 1 TABLET BY MOUTH EVERY DAY 90 tablet 1  .  fluticasone (FLONASE) 50 MCG/ACT nasal spray Place 1 spray into both nostrils 2 (two) times daily.     . furosemide (LASIX) 40 MG tablet TAKE 1 TABLET BY MOUTH EVERY DAY 90 tablet 1  . ibuprofen (ADVIL,MOTRIN) 200 MG tablet Take 200 mg by mouth every 6 (six) hours as needed.    [name] levonorgestrel (MIRENA) 20 MCG/24HR IUD 1 each by Intrauterine route once.    [name] losartan (COZAAR) 50 MG tablet Take 1 tablet (50 mg total) by mouth daily. 90 tablet 3  . metFORMIN (GLUCOPHAGE) 1000 MG tablet TAKE 1 TABLET (1,000 MG TOTAL) BY MOUTH 2 (TWO) TIMES DAILY WITH A MEAL. 180 tablet 1  . metoprolol succinate (TOPROL-XL) 50 MG 24 hr tablet Take 1 tablet (50 mg total) by mouth daily. Take with or immediately following a meal. 30 tablet 0  . Multiple Vitamins-Minerals (WOMENS DAILY FORMULA PO) Take 1 tablet by mouth daily.    . ONE TOUCH ULTRA TEST test strip USE TO TEST BLOOD GLUCOSE 2 TIMES DAILY AS DIRECTED 100 each 0  . ONETOUCH DELICA LANCETS 73S MISC USE TO TEST BLOOD GLUCOSE UP TO 2 TIMES DAILY AS DIRECTED 100 each 0  . Polyethyl Glycol-Propyl Glycol (SYSTANE) 0.4-0.3 % SOLN Apply to eye.    . Pseudoephedrine-Acetaminophen (ALKA-SELTZER PLUS COLD/SINUS PO) Take by mouth.     Current Facility-Administered Medications on File Prior to Visit  Medication Dose  Route Frequency Provider Last Rate Last Dose  . levonorgestrel (MIRENA) 20 MCG/24HR IUD   Intrauterine Once [name], [name], MD        PAST MEDICAL HISTORY: Past Medical History:  Diagnosis Date  . Allergy   . Back pain   . CHF (congestive heart failure) ([place])   . Diabetes ([place])   . Dyspnea   . Gallbladder problem   . GERD (gastroesophageal reflux disease)   . Hip pain   . Hyperlipemia   . Hypertension   . Knee pain   . Leg edema   . Obesity   . Sleep apnea   . Venous insufficiency     PAST SURGICAL HISTORY: Past Surgical History:  Procedure Laterality Date  . Birth [name] removed    . CHOLECYSTECTOMY    . INTRAUTERINE DEVICE  INSERTION  12/31/2013   Mirena    SOCIAL HISTORY: Social History   Tobacco Use  . Smoking status: Never Smoker  . Smokeless tobacco: Never Used  Substance Use Topics  . Alcohol use: Yes    Alcohol/week: 0.0 standard drinks    Comment: Rare  . Drug use: No    FAMILY HISTORY: Family History  Problem Relation Age of Onset  . Hypertension Father   . Diabetes Father   . Obesity Father   . Cancer Paternal Grandfather        Stomach  . Cancer Maternal Uncle        Pancreatic  . Cancer Paternal Uncle        Kidney  . Hypertension Mother   . Anxiety disorder Mother     ROS: Review of Systems  Constitutional: Positive for weight loss.  Gastrointestinal: Negative for nausea and vomiting.  Musculoskeletal:       Negative for muscle weakness.    PHYSICAL EXAM: Blood pressure 131/76, pulse 66, temperature 97.9 F (36.6 C), temperature source Oral, height '5\' 5"'  (1.651 m), weight (!) 336 lb (152.4 kg), SpO2 97 %. Body mass index is 55.91 kg/m. Physical Exam  Constitutional: She is oriented to person, place, and time. She appears well-developed and well-nourished.  Cardiovascular: Normal rate.  Pulmonary/Chest: Effort normal.  Musculoskeletal: Normal range of motion.  Neurological: She is oriented to person, place, and time.  Skin: Skin is warm and dry.  Psychiatric: She has a normal mood and affect. Her behavior is normal.  Vitals reviewed.   RECENT LABS AND TESTS: BMET    Component Value Date/Time   NA 142 10/20/2017 0940   K 4.1 10/20/2017 0940   CL 100 10/20/2017 0940   CO2 28 10/20/2017 0940   GLUCOSE 101 (H) 10/20/2017 0940   GLUCOSE 115 (H) 04/10/2017 1656   BUN 11 10/20/2017 0940   CREATININE 0.56 (L) 10/20/2017 0940   CALCIUM 9.2 10/20/2017 0940   GFRNONAA 108 10/20/2017 0940   GFRAA 124 10/20/2017 0940   Lab Results  Component Value Date   HGBA1C 7.2 (H) 10/20/2017   HGBA1C 7.3 (H) 07/14/2017   HGBA1C 7.7 (H) 04/03/2017   HGBA1C 7.4 (H)  09/13/2016   HGBA1C 7.8 (H) 04/12/2016   Lab Results  Component Value Date   INSULIN 9.1 10/20/2017   CBC    Component Value Date/Time   WBC 7.9 10/20/2017 0940   WBC 10.6 (H) 04/05/2017 0443   RBC 5.35 (H) 10/20/2017 0940   RBC 5.16 (H) 04/05/2017 0443   HGB 15.0 10/20/2017 0940   HCT 48.0 (H) 10/20/2017 0940   PLT 265 04/05/2017 0443  MCV 90 10/20/2017 0940   MCH 28.0 10/20/2017 0940   MCH 27.3 04/05/2017 0443   MCHC 31.3 (L) 10/20/2017 0940   MCHC 29.4 (L) 04/05/2017 0443   RDW 16.0 (H) 10/20/2017 0940   LYMPHSABS 1.2 10/20/2017 0940   MONOABS 0.7 04/05/2017 0443   EOSABS 0.1 10/20/2017 0940   BASOSABS 0.1 10/20/2017 0940   Iron/TIBC/Ferritin/ %Sat No results found for: IRON, TIBC, FERRITIN, IRONPCTSAT Lipid Panel     Component Value Date/Time   CHOL 127 10/20/2017 0940   TRIG 91 10/20/2017 0940   HDL 47 10/20/2017 0940   CHOLHDL 3 09/13/2016 0958   VLDL 21.8 09/13/2016 0958   LDLCALC 62 10/20/2017 0940   Hepatic Function Panel     Component Value Date/Time   PROT 7.1 10/20/2017 0940   ALBUMIN 4.1 10/20/2017 0940   AST 22 10/20/2017 0940   ALT 27 10/20/2017 0940   ALKPHOS 130 (H) 10/20/2017 0940   BILITOT 0.6 10/20/2017 0940      Component Value Date/Time   TSH 2.790 10/20/2017 0940   Results for GAYLEN, PEREIRA (MRN [id number]) as of 12/15/2017 16:15  Ref. Range 10/20/2017 09:40  Vitamin D, 25-Hydroxy Latest Ref Range: 30.0 - 100.0 ng/mL 39.0   ASSESSMENT AND PLAN: Vitamin D deficiency - Plan: Vitamin D, Ergocalciferol, (DRISDOL) 50000 units CAPS capsule  Type 2 diabetes mellitus without complication, without long-term current use of insulin (HCC)  At risk for heart disease  Class 3 severe obesity with serious comorbidity and body mass index (BMI) of 50.0 to 59.9 in adult, unspecified obesity type ([place])  PLAN:  Vitamin D Deficiency [name] was informed that low vitamin D levels contributes to fatigue and are associated with obesity,  breast, and colon cancer. She agrees to continue to take prescription Vit D '@50' ,000 IU every week #4 with no refills and will follow up for routine testing of vitamin D, at least 2-3 times per year. She was informed of the risk of over-replacement of vitamin D and agrees to not increase her dose unless she discusses this with [place] first. [name] agrees to follow up in 2 to 3 weeks.  Diabetes II [name] has been given extensive diabetes education by myself today including ideal fasting and post-prandial blood glucose readings, individual ideal Hgb A1c goals, and hypoglycemia prevention. We discussed the importance of good blood sugar control to decrease the likelihood of diabetic complications such as nephropathy, neuropathy, limb loss, blindness, coronary artery disease, and death. We discussed the importance of intensive lifestyle modification including diet, exercise and weight loss as the first line treatment for diabetes. We will recheck labs in 1 month. [name] agrees to continue her diet, exercise, and diabetes medications as directed. She agrees that she will follow up at the agreed upon time.  Cardiovascular risk counseling [name] was given extended (15 minutes) coronary artery disease prevention counseling today. She is 52 y.o. female and has risk factors for heart disease including diabetes and obesity. We discussed intensive lifestyle modifications today with an emphasis on specific weight loss instructions and strategies. Pt was also informed of the importance of increasing exercise and decreasing saturated fats to help prevent heart disease.  Obesity [name] is currently in the action stage of change. As such, her goal is to continue with weight loss efforts. She has agreed to follow the Category 4 plan + 100 calories. [name] has been instructed to work up to a goal of 150 minutes of combined cardio and strengthening exercise per week for weight  loss and overall health benefits. We  discussed the following Behavioral Modification Strategies today: increasing lean protein intake, work on meal planning and easy cooking plans, and holiday eating strategies.   [name] has agreed to follow up with our clinic in 2 to 3 weeks. She was informed of the importance of frequent follow up visits to maximize her success with intensive lifestyle modifications for her multiple health conditions.   OBESITY BEHAVIORAL INTERVENTION VISIT  Today's visit was # 4   Starting weight: 378 lbs Starting date: 10/20/17 Today's weight : Weight: (!) 336 lb (152.4 kg)  Today's date: 12/15/2017 Total lbs lost to date: 14  ASK: We discussed the diagnosis of obesity with [name] today and [name] agreed to give [place] permission to discuss obesity behavioral modification therapy today.  ASSESS: [name] has the diagnosis of obesity and her BMI today is 55.91. [name] is in the action stage of change.   ADVISE: [name] was educated on the multiple health risks of obesity as well as the benefit of weight loss to improve her health. She was advised of the need for long term treatment and the importance of lifestyle modifications to improve her current health and to decrease her risk of future health problems.  AGREE: Multiple dietary modification options and treatment options were discussed and [name] agreed to follow the recommendations documented in the above note.  ARRANGE: [name] was educated on the importance of frequent visits to treat obesity as outlined per CMS and USPSTF guidelines and agreed to schedule her next follow up appointment today.  Lenward Chancellor, am acting as Location manager for Starlyn Skeans, MD

## 2017-12-16 ENCOUNTER — Encounter (INDEPENDENT_AMBULATORY_CARE_PROVIDER_SITE_OTHER): Payer: Self-pay | Admitting: Family Medicine

## 2017-12-28 ENCOUNTER — Other Ambulatory Visit (INDEPENDENT_AMBULATORY_CARE_PROVIDER_SITE_OTHER): Payer: Self-pay | Admitting: Family Medicine

## 2017-12-28 DIAGNOSIS — E559 Vitamin D deficiency, unspecified: Secondary | ICD-10-CM

## 2017-12-28 DIAGNOSIS — I10 Essential (primary) hypertension: Secondary | ICD-10-CM

## 2017-12-28 NOTE — Progress Notes (Addendum)
Cardiology Office Note   Date:  12/30/2017   ID:  [name], DOB 08/03/1965, MRN [id number]  PCP:  Wanda Kern, DO  Cardiologist:   No primary care provider on file.   Chief Complaint  Patient presents with  . Shortness of Breath     History of Present Illness: [name] is a 52 y.o. female who presents for follow up after a hospitalization for SOB.  She was managed for diastolic dysfunction.   She was discharged on a small dose of Lasix.  She has lost about 50 pounds or so.  She is working with medical weight loss clinic.  She feels much better.  She is not having any acute shortness of breath that she was having.  She works full-time.  She denies any cardiovascular symptoms such as chest pressure, neck or arm discomfort.  She is had no new palpitations, presyncope or syncope.  She is had no weight gain or edema.   Past Medical History:  Diagnosis Date  . Allergy   . Back pain   . CHF (congestive heart failure) ([place])   . Diabetes ([place])   . Dyspnea   . Gallbladder problem   . GERD (gastroesophageal reflux disease)   . Hip pain   . Hyperlipemia   . Hypertension   . Knee pain   . Leg edema   . Obesity   . Sleep apnea   . Venous insufficiency     Past Surgical History:  Procedure Laterality Date  . Birth Elta [place] removed    . CHOLECYSTECTOMY    . INTRAUTERINE DEVICE INSERTION  12/31/2013   Mirena     Current Outpatient Medications  Medication Sig Dispense Refill  . Ascorbic Acid (VITAMIN C PO) Take by mouth.    [name] atorvastatin (LIPITOR) 20 MG tablet Take 1 tablet (20 mg total) by mouth daily. 90 tablet 3  . blood glucose meter kit and supplies Dispense based on patient and insurance preference. Use up to twice daily as directed. (FOR ICD-10 E10.9, E11.9). 1 each 0  . [facility] ASPIRIN LOW STRENGTH 81 MG EC tablet TAKE 1 TABLET BY MOUTH EVERY DAY 90 tablet 1  . furosemide (LASIX) 40 MG tablet TAKE 1 TABLET BY MOUTH EVERY DAY 90 tablet 1  .  ibuprofen (ADVIL,MOTRIN) 200 MG tablet Take 200 mg by mouth every 6 (six) hours as needed.    [name] levonorgestrel (MIRENA) 20 MCG/24HR IUD 1 each by Intrauterine route once.    [name] losartan (COZAAR) 50 MG tablet Take 1 tablet (50 mg total) by mouth daily. 90 tablet 3  . metFORMIN (GLUCOPHAGE) 1000 MG tablet TAKE 1 TABLET (1,000 MG TOTAL) BY MOUTH 2 (TWO) TIMES DAILY WITH A MEAL. 180 tablet 1  . metoprolol succinate (TOPROL-XL) 50 MG 24 hr tablet Take 1 tablet (50 mg total) by mouth daily. Take with or immediately following a meal. 30 tablet 0  . Multiple Vitamins-Minerals (WOMENS DAILY FORMULA PO) Take 1 tablet by mouth daily.    . ONE TOUCH ULTRA TEST test strip USE TO TEST BLOOD GLUCOSE 2 TIMES DAILY AS DIRECTED 100 each 0  . ONETOUCH DELICA LANCETS 96P MISC USE TO TEST BLOOD GLUCOSE UP TO 2 TIMES DAILY AS DIRECTED 100 each 0  . Polyethyl Glycol-Propyl Glycol (SYSTANE) 0.4-0.3 % SOLN Apply to eye.    . Pseudoephedrine-Acetaminophen (ALKA-SELTZER PLUS COLD/SINUS PO) Take by mouth.    . Vitamin D, Ergocalciferol, (DRISDOL) 50000 units CAPS capsule Take 1 capsule (  50,000 Units total) by mouth every 7 (seven) days. 4 capsule 0   Current Facility-Administered Medications  Medication Dose Route Frequency Provider Last Rate Last Dose  . levonorgestrel (MIRENA) 20 MCG/24HR IUD   Intrauterine Once [name], [name], MD        Allergies:   Patient has no known allergies.     ROS:  Please see the history of present illness.   Otherwise, review of systems are positive for none.   All other systems are reviewed and negative.    PHYSICAL EXAM: VS:  BP (!) 148/86   Pulse 81   Ht '5\' 5"'  (1.651 m)   Wt (!) 335 lb 3.2 oz (152 kg)   BMI 55.78 kg/m  , BMI Body mass index is 55.78 kg/m. GENERAL:  Well appearing NECK:  No jugular venous distention, waveform within normal limits, carotid upstroke brisk and symmetric, no bruits, no thyromegaly LUNGS:  Clear to auscultation bilaterally CHEST:   Unremarkable HEART:  PMI not displaced or sustained,S1 and S2 within normal limits, no S3, no S4, no clicks, no rubs, 2 out of 6 left upper sternal border slight systolic murmur heard also at the right upper sternal border and no change with Valsalva, no diastolic murmurs ABD:  Flat, positive bowel sounds normal in frequency in pitch, no bruits, no rebound, no guarding, no midline pulsatile mass, no hepatomegaly, no splenomegaly EXT:  2 plus pulses throughout, mild bilateral edema, no cyanosis no clubbing    EKG:  EKG not ordered today.    Recent Labs: 04/03/2017: B Natriuretic Peptide 90.0 04/05/2017: Magnesium 1.9; Platelets 265 10/20/2017: ALT 27; BUN 11; Creatinine, Ser 0.56; Hemoglobin 15.0; Potassium 4.1; Sodium 142; TSH 2.790    Lipid Panel    Component Value Date/Time   CHOL 127 10/20/2017 0940   TRIG 91 10/20/2017 0940   HDL 47 10/20/2017 0940   CHOLHDL 3 09/13/2016 0958   VLDL 21.8 09/13/2016 0958   LDLCALC 62 10/20/2017 0940      Wt Readings from Last 3 Encounters:  12/30/17 (!) 335 lb 3.2 oz (152 kg)  12/15/17 (!) 336 lb (152.4 kg)  11/24/17 (!) 350 lb (158.8 kg)      Other studies Reviewed: Additional studies/ records that were reviewed today include: None Review of the above records demonstrates:      ASSESSMENT AND PLAN:  ACUTE ON CHRONIC DIASTOLIC DYSFUNCTION:     This is controlled with weight loss and diet and fluid management.  SLEEP APNEA:  She was found to have sleep apnea after the last visit.  She is wearing her CPAP.  She hates it but she uses it.  She is 100% compliant.  The patient is benefiting from her therapy.  OBESITY:   I am very proud of her weight loss and encouraged more of the same.  MURMUR: I see no structural disease on her echo.  There is no evidence of hypertrophic cardiomyopathy.  I will follow this again clinically in 1 year.  HTN:   She was to keep a diary after the last visit.  Her BP has been much better controlled even though  it is up a little bit today.  This is getting come down with diet and weight loss.  I will not change her meds.  DM:  A1c was 7.2.  This is improved I will defer to Wanda Kern, DO  Current medicines are reviewed at length with the patient today.  The patient does not have concerns regarding  medicines.  The following changes have been made:  None  Labs/ tests ordered today include:   None   No orders of the defined types were placed in this encounter.    Disposition:   FU with me in 12 months.     Signed, Minus Breeding, MD  12/30/2017 5:36 PM    [facility]

## 2017-12-30 ENCOUNTER — Ambulatory Visit: Payer: BC Managed Care – PPO | Admitting: Cardiology

## 2017-12-30 ENCOUNTER — Encounter: Payer: Self-pay | Admitting: Cardiology

## 2017-12-30 ENCOUNTER — Other Ambulatory Visit: Payer: Self-pay | Admitting: Family Medicine

## 2017-12-30 VITALS — BP 148/86 | HR 81 | Ht 65.0 in | Wt 335.2 lb

## 2017-12-30 DIAGNOSIS — I5032 Chronic diastolic (congestive) heart failure: Secondary | ICD-10-CM | POA: Diagnosis not present

## 2017-12-30 NOTE — Patient Instructions (Signed)

## 2017-12-31 ENCOUNTER — Ambulatory Visit (INDEPENDENT_AMBULATORY_CARE_PROVIDER_SITE_OTHER): Payer: BC Managed Care – PPO | Admitting: Family Medicine

## 2017-12-31 ENCOUNTER — Encounter (INDEPENDENT_AMBULATORY_CARE_PROVIDER_SITE_OTHER): Payer: Self-pay | Admitting: Family Medicine

## 2017-12-31 VITALS — BP 140/80 | HR 66 | Temp 98.3°F | Ht 65.0 in | Wt 334.0 lb

## 2017-12-31 DIAGNOSIS — E559 Vitamin D deficiency, unspecified: Secondary | ICD-10-CM | POA: Diagnosis not present

## 2017-12-31 DIAGNOSIS — Z6841 Body Mass Index (BMI) 40.0 and over, adult: Secondary | ICD-10-CM

## 2017-12-31 MED ORDER — VITAMIN D (ERGOCALCIFEROL) 1.25 MG (50000 UNIT) PO CAPS: 50000.0000 [IU] | ORAL_CAPSULE | ORAL | 0 refills | Status: DC

## 2017-12-31 NOTE — Progress Notes (Signed)
Office: [phone]  /  Fax: [phone]   HPI:   Chief Complaint: OBESITY [name] is here to discuss her progress with her obesity treatment plan. She is on the Category 4 plan and is following her eating plan approximately 96 % of the time. She states she is exercising 0 minutes 0 times per week. [name] continues to do well with weight loss. She has not lost weight as quickly this visit, but this is expected at this stage. She is concerned about holiday eating temptations.  Her weight is (!) 334 lb (151.5 kg) today and has had a weight loss of 2 pounds over a period of 2 weeks since her last visit. She has lost 44 lbs since starting treatment with [place].  Vitamin D Deficiency [name] has a diagnosis of vitamin D deficiency. She is stable on prescription Vit D and denies nausea, vomiting or muscle weakness.  ALLERGIES: No Known Allergies  MEDICATIONS: Current Outpatient Medications on File Prior to Visit  Medication Sig Dispense Refill  . Ascorbic Acid (VITAMIN C PO) Take by mouth.    [name] atorvastatin (LIPITOR) 20 MG tablet Take 1 tablet (20 mg total) by mouth daily. 90 tablet 3  . blood glucose meter kit and supplies Dispense based on patient and insurance preference. Use up to twice daily as directed. (FOR ICD-10 E10.9, E11.9). 1 each 0  . [facility] ASPIRIN LOW STRENGTH 81 MG EC tablet TAKE 1 TABLET BY MOUTH EVERY DAY 90 tablet 1  . furosemide (LASIX) 40 MG tablet TAKE 1 TABLET BY MOUTH EVERY DAY 90 tablet 1  . ibuprofen (ADVIL,MOTRIN) 200 MG tablet Take 200 mg by mouth every 6 (six) hours as needed.    [name] levonorgestrel (MIRENA) 20 MCG/24HR IUD 1 each by Intrauterine route once.    [name] losartan (COZAAR) 50 MG tablet Take 1 tablet (50 mg total) by mouth daily. 90 tablet 3  . metFORMIN (GLUCOPHAGE) 1000 MG tablet TAKE 1 TABLET (1,000 MG TOTAL) BY MOUTH 2 (TWO) TIMES DAILY WITH A MEAL. 180 tablet 1  . metoprolol succinate (TOPROL-XL) 50 MG 24 hr tablet Take 1 tablet (50 mg total) by mouth  daily. Take with or immediately following a meal. 30 tablet 0  . Multiple Vitamins-Minerals (WOMENS DAILY FORMULA PO) Take 1 tablet by mouth daily.    . ONE TOUCH ULTRA TEST test strip USE TO TEST BLOOD GLUCOSE 2 TIMES DAILY AS DIRECTED 100 each 0  . ONETOUCH DELICA LANCETS 57D MISC USE TO TEST BLOOD GLUCOSE UP TO 2 TIMES DAILY AS DIRECTED 100 each 0  . Polyethyl Glycol-Propyl Glycol (SYSTANE) 0.4-0.3 % SOLN Apply to eye.    . Pseudoephedrine-Acetaminophen (ALKA-SELTZER PLUS COLD/SINUS PO) Take by mouth.     Current Facility-Administered Medications on File Prior to Visit  Medication Dose Route Frequency Provider Last Rate Last Dose  . levonorgestrel (MIRENA) 20 MCG/24HR IUD   Intrauterine Once [name], [name], MD        PAST MEDICAL HISTORY: Past Medical History:  Diagnosis Date  . Allergy   . Back pain   . CHF (congestive heart failure) ([place])   . Diabetes ([place])   . Dyspnea   . Gallbladder problem   . GERD (gastroesophageal reflux disease)   . Hip pain   . Hyperlipemia   . Hypertension   . Knee pain   . Leg edema   . Obesity   . Sleep apnea   . Venous insufficiency     PAST SURGICAL HISTORY: Past Surgical History:  Procedure Laterality Date  . Birth Elta [place] removed    . CHOLECYSTECTOMY    . INTRAUTERINE DEVICE INSERTION  12/31/2013   Mirena    SOCIAL HISTORY: Social History   Tobacco Use  . Smoking status: Never Smoker  . Smokeless tobacco: Never Used  Substance Use Topics  . Alcohol use: Yes    Alcohol/week: 0.0 standard drinks    Comment: Rare  . Drug use: No    FAMILY HISTORY: Family History  Problem Relation Age of Onset  . Hypertension Father   . Diabetes Father   . Obesity Father   . Cancer Paternal Grandfather        Stomach  . Cancer Maternal Uncle        Pancreatic  . Cancer Paternal Uncle        Kidney  . Hypertension Mother   . Anxiety disorder Mother     ROS: Review of Systems  Constitutional: Positive for weight loss.    Gastrointestinal: Negative for nausea and vomiting.  Musculoskeletal:       Negative muscle weakness    PHYSICAL EXAM: Blood pressure 140/80, pulse 66, temperature 98.3 F (36.8 C), temperature source Oral, height 5' 5" (1.651 m), weight (!) 334 lb (151.5 kg), SpO2 98 %. Body mass index is 55.58 kg/m. Physical Exam  Constitutional: She is oriented to person, place, and time. She appears well-developed and well-nourished.  Cardiovascular: Normal rate.  Pulmonary/Chest: Effort normal.  Musculoskeletal: Normal range of motion.  Neurological: She is oriented to person, place, and time.  Skin: Skin is warm and dry.  Psychiatric: She has a normal mood and affect. Her behavior is normal.  Vitals reviewed.   RECENT LABS AND TESTS: BMET    Component Value Date/Time   NA 142 10/20/2017 0940   K 4.1 10/20/2017 0940   CL 100 10/20/2017 0940   CO2 28 10/20/2017 0940   GLUCOSE 101 (H) 10/20/2017 0940   GLUCOSE 115 (H) 04/10/2017 1656   BUN 11 10/20/2017 0940   CREATININE 0.56 (L) 10/20/2017 0940   CALCIUM 9.2 10/20/2017 0940   GFRNONAA 108 10/20/2017 0940   GFRAA 124 10/20/2017 0940   Lab Results  Component Value Date   HGBA1C 7.2 (H) 10/20/2017   HGBA1C 7.3 (H) 07/14/2017   HGBA1C 7.7 (H) 04/03/2017   HGBA1C 7.4 (H) 09/13/2016   HGBA1C 7.8 (H) 04/12/2016   Lab Results  Component Value Date   INSULIN 9.1 10/20/2017   CBC    Component Value Date/Time   WBC 7.9 10/20/2017 0940   WBC 10.6 (H) 04/05/2017 0443   RBC 5.35 (H) 10/20/2017 0940   RBC 5.16 (H) 04/05/2017 0443   HGB 15.0 10/20/2017 0940   HCT 48.0 (H) 10/20/2017 0940   PLT 265 04/05/2017 0443   MCV 90 10/20/2017 0940   MCH 28.0 10/20/2017 0940   MCH 27.3 04/05/2017 0443   MCHC 31.3 (L) 10/20/2017 0940   MCHC 29.4 (L) 04/05/2017 0443   RDW 16.0 (H) 10/20/2017 0940   LYMPHSABS 1.2 10/20/2017 0940   MONOABS 0.7 04/05/2017 0443   EOSABS 0.1 10/20/2017 0940   BASOSABS 0.1 10/20/2017 0940    Iron/TIBC/Ferritin/ %Sat No results found for: IRON, TIBC, FERRITIN, IRONPCTSAT Lipid Panel     Component Value Date/Time   CHOL 127 10/20/2017 0940   TRIG 91 10/20/2017 0940   HDL 47 10/20/2017 0940   CHOLHDL 3 09/13/2016 0958   VLDL 21.8 09/13/2016 0958   LDLCALC 62 10/20/2017 0940   Hepatic Function  Panel     Component Value Date/Time   PROT 7.1 10/20/2017 0940   ALBUMIN 4.1 10/20/2017 0940   AST 22 10/20/2017 0940   ALT 27 10/20/2017 0940   ALKPHOS 130 (H) 10/20/2017 0940   BILITOT 0.6 10/20/2017 0940      Component Value Date/Time   TSH 2.790 10/20/2017 0940  Results for KJERSTIN, ABRIGO (MRN [id number]) as of 12/31/2017 17:41  Ref. Range 10/20/2017 09:40  Vitamin D, 25-Hydroxy Latest Ref Range: 30.0 - 100.0 ng/mL 39.0    ASSESSMENT AND PLAN: Vitamin D deficiency - Plan: Vitamin D, Ergocalciferol, (DRISDOL) 1.25 MG (50000 UT) CAPS capsule  Class 3 severe obesity with serious comorbidity and body mass index (BMI) of 50.0 to 59.9 in adult, unspecified obesity type ([place])  PLAN:  Vitamin D Deficiency [name] was informed that low vitamin D levels contributes to fatigue and are associated with obesity, breast, and colon cancer. [name] agrees to continue taking prescription Vit D _0 ,000 IU every week #4 and we will refill for 1 month. She will follow up for routine testing of vitamin D, at least 2-3 times per year. She was informed of the risk of over-replacement of vitamin D and agrees to not increase her dose unless she discusses this with [place] first. [name] agrees to follow up with our clinic in 2 to 3 weeks.  I spent > than 50% of the 25 minute visit on counseling as documented in the note.  Obesity [name] is currently in the action stage of change. As such, her goal is to continue with weight loss efforts She has agreed to follow the Category 4 plan [name] has been instructed to work up to a goal of 150 minutes of combined cardio and strengthening exercise  per week for weight loss and overall health benefits. We discussed the following Behavioral Modification Strategies today: increasing lean protein intake, decreasing simple carbohydrates, work on meal planning and easy cooking plans, holiday eating strategies, and travel eating strategies   [name] has agreed to follow up with our clinic in 2 to 3 weeks. She was informed of the importance of frequent follow up visits to maximize her success with intensive lifestyle modifications for her multiple health conditions.   OBESITY BEHAVIORAL INTERVENTION VISIT  Today's visit was # 6   Starting weight: 378 lbs Starting date: 10/20/17 Today's weight : 334 lbs Today's date: 12/31/2017 Total lbs lost to date: 60    ASK: We discussed the diagnosis of obesity with [name] today and [name] agreed to give [place] permission to discuss obesity behavioral modification therapy today.  ASSESS: [name] has the diagnosis of obesity and her BMI today is 55.58 [name] is in the action stage of change   ADVISE: [name] was educated on the multiple health risks of obesity as well as the benefit of weight loss to improve her health. She was advised of the need for long term treatment and the importance of lifestyle modifications to improve her current health and to decrease her risk of future health problems.  AGREE: Multiple dietary modification options and treatment options were discussed and  [name] agreed to follow the recommendations documented in the above note.  ARRANGE: [name] was educated on the importance of frequent visits to treat obesity as outlined per CMS and USPSTF guidelines and agreed to schedule her next follow up appointment today.  I, Trixie Dredge, am acting as transcriptionist for Dennard Nip, MD  I have reviewed the above documentation for accuracy and completeness, and  I agree with the above. -Dennard Nip, MD

## 2018-01-03 ENCOUNTER — Other Ambulatory Visit (INDEPENDENT_AMBULATORY_CARE_PROVIDER_SITE_OTHER): Payer: Self-pay | Admitting: Family Medicine

## 2018-01-03 DIAGNOSIS — I10 Essential (primary) hypertension: Secondary | ICD-10-CM

## 2018-01-05 MED ORDER — METOPROLOL SUCCINATE ER 50 MG PO TB24: 50.0000 mg | ORAL_TABLET | Freq: Every day | ORAL | 0 refills | Status: DC

## 2018-01-13 ENCOUNTER — Encounter (INDEPENDENT_AMBULATORY_CARE_PROVIDER_SITE_OTHER): Payer: Self-pay | Admitting: Family Medicine

## 2018-01-21 ENCOUNTER — Encounter (INDEPENDENT_AMBULATORY_CARE_PROVIDER_SITE_OTHER): Payer: Self-pay | Admitting: Family Medicine

## 2018-01-21 ENCOUNTER — Ambulatory Visit (INDEPENDENT_AMBULATORY_CARE_PROVIDER_SITE_OTHER): Payer: BC Managed Care – PPO | Admitting: Family Medicine

## 2018-01-21 VITALS — BP 146/73 | HR 70 | Temp 98.6°F | Ht 65.0 in | Wt 323.0 lb

## 2018-01-21 DIAGNOSIS — E559 Vitamin D deficiency, unspecified: Secondary | ICD-10-CM

## 2018-01-21 DIAGNOSIS — I10 Essential (primary) hypertension: Secondary | ICD-10-CM

## 2018-01-21 DIAGNOSIS — Z9189 Other specified personal risk factors, not elsewhere classified: Secondary | ICD-10-CM | POA: Diagnosis not present

## 2018-01-21 DIAGNOSIS — Z6841 Body Mass Index (BMI) 40.0 and over, adult: Secondary | ICD-10-CM

## 2018-01-21 DIAGNOSIS — E119 Type 2 diabetes mellitus without complications: Secondary | ICD-10-CM

## 2018-01-21 MED ORDER — GLUCOSE BLOOD VI STRP: ORAL_STRIP | 0 refills | Status: DC

## 2018-01-21 MED ORDER — ONETOUCH DELICA LANCETS 33G MISC: 0 refills | Status: DC

## 2018-01-21 MED ORDER — METOPROLOL SUCCINATE ER 50 MG PO TB24: 50.0000 mg | ORAL_TABLET | Freq: Every day | ORAL | 0 refills | Status: DC

## 2018-01-21 MED ORDER — VITAMIN D (ERGOCALCIFEROL) 1.25 MG (50000 UNIT) PO CAPS: 50000.0000 [IU] | ORAL_CAPSULE | ORAL | 0 refills | Status: DC

## 2018-01-23 ENCOUNTER — Other Ambulatory Visit (INDEPENDENT_AMBULATORY_CARE_PROVIDER_SITE_OTHER): Payer: Self-pay | Admitting: Family Medicine

## 2018-01-23 DIAGNOSIS — E559 Vitamin D deficiency, unspecified: Secondary | ICD-10-CM

## 2018-01-25 ENCOUNTER — Other Ambulatory Visit: Payer: Self-pay | Admitting: Family Medicine

## 2018-01-26 NOTE — Progress Notes (Signed)
Office: [phone]  /  Fax: [phone]   HPI:   Chief Complaint: OBESITY [name] is here to discuss her progress with her obesity treatment plan. She is on the Category 4 plan and is following her eating plan approximately 85% of the time. She states she is exercising 0 minutes 0 times per week. [name] has adhered to the meal plan very well.  Her weight is (!) 323 lb (146.5 kg) today and has had a weight loss of 11 pounds over a period of 3 to 4 weeks since her last visit. She has lost 55 lbs since starting treatment with [place].  Diabetes II [name] has a diagnosis of diabetes type II. [name] states fasting BGs range between 110 and 122, and 2 hour post prandial range between 111 and 130. She denies nausea, vomiting, diarrhea, or hypoglycemia. Last A1c was 7.2. She has been working on intensive lifestyle modifications including diet and weight loss to help control her blood glucose levels.  Vitamin D Deficiency [name] has a diagnosis of vitamin D deficiency. She is currently taking prescription Vit D, but level is not at goal. Last Vit D was 39 on 10/20/17. She notes fatigue but it has improved and denies nausea, vomiting or muscle weakness.  At risk for osteopenia and osteoporosis [name] is at higher risk of osteopenia and osteoporosis due to vitamin D deficiency.   Hypertension [name] is a 52 y.o. female with hypertension. [name] blood pressure is elevated today and she denies chest pain or shortness of breath. She is working weight loss to help control her blood pressure with the goal of decreasing her risk of heart attack and stroke. [name]'s blood pressure is not currently controlled.  ALLERGIES: No Known Allergies  MEDICATIONS: Current Outpatient Medications on File Prior to Visit  Medication Sig Dispense Refill  . Ascorbic Acid (VITAMIN C PO) Take by mouth.    [name] atorvastatin (LIPITOR) 20 MG tablet Take 1 tablet (20 mg total) by mouth daily. 90 tablet 3    . blood glucose meter kit and supplies Dispense based on patient and insurance preference. Use up to twice daily as directed. (FOR ICD-10 E10.9, E11.9). 1 each 0  . [facility] ASPIRIN LOW STRENGTH 81 MG EC tablet TAKE 1 TABLET BY MOUTH EVERY DAY 90 tablet 1  . furosemide (LASIX) 40 MG tablet TAKE 1 TABLET BY MOUTH EVERY DAY 90 tablet 1  . ibuprofen (ADVIL,MOTRIN) 200 MG tablet Take 200 mg by mouth every 6 (six) hours as needed.    [name] levonorgestrel (MIRENA) 20 MCG/24HR IUD 1 each by Intrauterine route once.    [name] losartan (COZAAR) 25 MG tablet TAKE 2 TABLETS BY MOUTH EVERY DAY 180 tablet 1  . losartan (COZAAR) 50 MG tablet Take 1 tablet (50 mg total) by mouth daily. 90 tablet 3  . metFORMIN (GLUCOPHAGE) 1000 MG tablet TAKE 1 TABLET (1,000 MG TOTAL) BY MOUTH 2 (TWO) TIMES DAILY WITH A MEAL. 180 tablet 1  . Multiple Vitamins-Minerals (WOMENS DAILY FORMULA PO) Take 1 tablet by mouth daily.    [name]-Propyl Glycol (SYSTANE) 0.4-0.3 % SOLN Apply to eye.    . Pseudoephedrine-Acetaminophen (ALKA-SELTZER PLUS COLD/SINUS PO) Take by mouth.     Current Facility-Administered Medications on File Prior to Visit  Medication Dose Route Frequency Provider Last Rate Last Dose  . levonorgestrel (MIRENA) 20 MCG/24HR IUD   Intrauterine Once [name], [name], MD        PAST MEDICAL HISTORY: Past Medical History:  Diagnosis  Date  . Allergy   . Back pain   . CHF (congestive heart failure) ([place])   . Diabetes ([place])   . Dyspnea   . Gallbladder problem   . GERD (gastroesophageal reflux disease)   . Hip pain   . Hyperlipemia   . Hypertension   . Knee pain   . Leg edema   . Obesity   . Sleep apnea   . Venous insufficiency     PAST SURGICAL HISTORY: Past Surgical History:  Procedure Laterality Date  . Birth Elta [place] removed    . CHOLECYSTECTOMY    . INTRAUTERINE DEVICE INSERTION  12/31/2013   Mirena    SOCIAL HISTORY: Social History   Tobacco Use  . Smoking status: Never Smoker  . Smokeless  tobacco: Never Used  Substance Use Topics  . Alcohol use: Yes    Alcohol/week: 0.0 standard drinks    Comment: Rare  . Drug use: No    FAMILY HISTORY: Family History  Problem Relation Age of Onset  . Hypertension Father   . Diabetes Father   . Obesity Father   . Cancer Paternal Grandfather        Stomach  . Cancer Maternal Uncle        Pancreatic  . Cancer Paternal Uncle        Kidney  . Hypertension Mother   . Anxiety disorder Mother     ROS: Review of Systems  Constitutional: Positive for malaise/fatigue and weight loss.  Respiratory: Negative for shortness of breath.   Cardiovascular: Negative for chest pain.  Gastrointestinal: Negative for diarrhea, nausea and vomiting.  Musculoskeletal:       Negative muscle weakness    PHYSICAL EXAM: Blood pressure (!) 146/73, pulse 70, temperature 98.6 F (37 C), temperature source Oral, height '5\' 5"'  (1.651 m), weight (!) 323 lb (146.5 kg), SpO2 99 %. Body mass index is 53.75 kg/m. Physical Exam Vitals signs reviewed.  Constitutional:      Appearance: Normal appearance. She is obese.  Cardiovascular:     Rate and Rhythm: Normal rate.  Pulmonary:     Effort: Pulmonary effort is normal.  Musculoskeletal: Normal range of motion.  Skin:    General: Skin is warm and dry.  Neurological:     Mental Status: She is alert and oriented to person, place, and time.  Psychiatric:        Mood and Affect: Mood normal.        Behavior: Behavior normal.     RECENT LABS AND TESTS: BMET    Component Value Date/Time   NA 142 10/20/2017 0940   K 4.1 10/20/2017 0940   CL 100 10/20/2017 0940   CO2 28 10/20/2017 0940   GLUCOSE 101 (H) 10/20/2017 0940   GLUCOSE 115 (H) 04/10/2017 1656   BUN 11 10/20/2017 0940   CREATININE 0.56 (L) 10/20/2017 0940   CALCIUM 9.2 10/20/2017 0940   GFRNONAA 108 10/20/2017 0940   GFRAA 124 10/20/2017 0940   Lab Results  Component Value Date   HGBA1C 7.2 (H) 10/20/2017   HGBA1C 7.3 (H) 07/14/2017     HGBA1C 7.7 (H) 04/03/2017   HGBA1C 7.4 (H) 09/13/2016   HGBA1C 7.8 (H) 04/12/2016   Lab Results  Component Value Date   INSULIN 9.1 10/20/2017   CBC    Component Value Date/Time   WBC 7.9 10/20/2017 0940   WBC 10.6 (H) 04/05/2017 0443   RBC 5.35 (H) 10/20/2017 0940   RBC 5.16 (H) 04/05/2017 5726  HGB 15.0 10/20/2017 0940   HCT 48.0 (H) 10/20/2017 0940   PLT 265 04/05/2017 0443   MCV 90 10/20/2017 0940   MCH 28.0 10/20/2017 0940   MCH 27.3 04/05/2017 0443   MCHC 31.3 (L) 10/20/2017 0940   MCHC 29.4 (L) 04/05/2017 0443   RDW 16.0 (H) 10/20/2017 0940   LYMPHSABS 1.2 10/20/2017 0940   MONOABS 0.7 04/05/2017 0443   EOSABS 0.1 10/20/2017 0940   BASOSABS 0.1 10/20/2017 0940   Iron/TIBC/Ferritin/ %Sat No results found for: IRON, TIBC, FERRITIN, IRONPCTSAT Lipid Panel     Component Value Date/Time   CHOL 127 10/20/2017 0940   TRIG 91 10/20/2017 0940   HDL 47 10/20/2017 0940   CHOLHDL 3 09/13/2016 0958   VLDL 21.8 09/13/2016 0958   LDLCALC 62 10/20/2017 0940   Hepatic Function Panel     Component Value Date/Time   PROT 7.1 10/20/2017 0940   ALBUMIN 4.1 10/20/2017 0940   AST 22 10/20/2017 0940   ALT 27 10/20/2017 0940   ALKPHOS 130 (H) 10/20/2017 0940   BILITOT 0.6 10/20/2017 0940      Component Value Date/Time   TSH 2.790 10/20/2017 0940  Results for HESTER, JOSLIN (MRN [id number]) as of 01/26/2018 18:13  Ref. Range 10/20/2017 09:40  Vitamin D, 25-Hydroxy Latest Ref Range: 30.0 - 100.0 ng/mL 39.0    ASSESSMENT AND PLAN: Type 2 diabetes mellitus without complication, without long-term current use of insulin (HCC) - Plan: glucose blood (ONE TOUCH ULTRA TEST) test strip, ONETOUCH DELICA LANCETS 26J MISC  Vitamin D deficiency - Plan: Vitamin D, Ergocalciferol, (DRISDOL) 1.25 MG (50000 UT) CAPS capsule  Essential hypertension - Plan: metoprolol succinate (TOPROL-XL) 50 MG 24 hr tablet  At risk for osteoporosis  Class 3 severe obesity with serious  comorbidity and body mass index (BMI) of 50.0 to 59.9 in adult, unspecified obesity type ([place])  PLAN:  Diabetes II Aeriana has been given extensive diabetes education by myself today including ideal fasting and post-prandial blood glucose readings, individual ideal Hgb A1c goals and hypoglycemia prevention. We discussed the importance of good blood sugar control to decrease the likelihood of diabetic complications such as nephropathy, neuropathy, limb loss, blindness, coronary artery disease, and death. We discussed the importance of intensive lifestyle modification including diet, exercise and weight loss as the first line treatment for diabetes. Winslow agrees to continue metformin and we will refill test strips and lancets for BID testing. Jancie agrees to follow up with our clinic in 3 weeks.  Vitamin D Deficiency [name] was informed that low vitamin D levels contributes to fatigue and are associated with obesity, breast, and colon cancer. [name] agrees to continue taking prescription Vit D '@50' ,000 IU every week #4 and we will refill for 1 month. She will follow up for routine testing of vitamin D, at least 2-3 times per year. She was informed of the risk of over-replacement of vitamin D and agrees to not increase her dose unless she discusses this with [place] first. [name] agrees to follow up with our clinic in 3 weeks.  At risk for osteopenia and osteoporosis [name] was given extended (15 minutes) osteoporosis prevention counseling today. [name] is at risk for osteopenia and osteoporsis due to her vitamin D deficiency. She was encouraged to take her vitamin D and follow her higher calcium diet and increase strengthening exercise to help strengthen her bones and decrease her risk of osteopenia and osteoporosis.  Hypertension We discussed sodium restriction, working on healthy weight loss, and a regular  exercise program as the means to achieve improved blood pressure control. [name] agreed with  this plan and agreed to follow up as directed. We will continue to monitor her blood pressure as well as her progress with the above lifestyle modifications. [name] agrees to continue taking metoprolol and losartan. We will refill metoprolol 50 mg 24 hour daily #30 for 1 month.  I may consider increasing losartan if BP does not improve. [name] agrees to follow up with our clinic in 3 weeks.  Obesity Mardene is currently in the action stage of change. As such, her goal is to continue with weight loss efforts She has agreed to follow the keep a journal with 550-700 calories and 45+ grams of protein at supper daily and follow the Category 4 plan [name] has been instructed to start walking for 30 minutes 3 times per week. We discussed the following Behavioral Modification Strategies today: work on meal planning and easy cooking plans, celebration eating strategies, planning for success, and keep a strict food journal  [name] may journal at dinner. "Eating Out" and "Journaling" handouts were provided.  Makailyn has agreed to follow up with our clinic in 3 weeks. She was informed of the importance of frequent follow up visits to maximize her success with intensive lifestyle modifications for her multiple health conditions.   OBESITY BEHAVIORAL INTERVENTION VISIT  Today's visit was # 7  Starting weight: 378 lbs Starting date: 10/20/17 Today's weight : 323 lbs Today's date: 01/21/2018 Total lbs lost to date: 65    ASK: We discussed the diagnosis of obesity with [name] today and [name] agreed to give [place] permission to discuss obesity behavioral modification therapy today.  ASSESS: Shalah has the diagnosis of obesity and her BMI today is 53.75 Nikie is in the action stage of change   ADVISE: Tamina was educated on the multiple health risks of obesity as well as the benefit of weight loss to improve her health. She was advised of the need for long term treatment and the  importance of lifestyle modifications to improve her current health and to decrease her risk of future health problems.  AGREE: Multiple dietary modification options and treatment options were discussed and  Deardra agreed to follow the recommendations documented in the above note.  ARRANGE: Nadira was educated on the importance of frequent visits to treat obesity as outlined per CMS and USPSTF guidelines and agreed to schedule her next follow up appointment today.  Wilhemena Durie, am acting as Location manager for [employer], FNP-C.  I have reviewed the above documentation for accuracy and completeness, and I agree with the above.  - Jazalyn Mondor, FNP-C.

## 2018-01-26 NOTE — Telephone Encounter (Signed)
Not an appropriate alternative. Recommend crestor or lipitor. Please let us know which is cheaper. If neither is affordable at this pharmacy would recommend a printed rx so that she can shop elsewhere or appt to discuss. Thanks.

## 2018-01-27 ENCOUNTER — Encounter (INDEPENDENT_AMBULATORY_CARE_PROVIDER_SITE_OTHER): Payer: Self-pay | Admitting: Family Medicine

## 2018-01-27 NOTE — Telephone Encounter (Signed)
I called [facility], spoke with the pharmacist Devina and informed her of the message below.  She stated Crestor would be the same price as Lipitor which is the Rx the insurance requested to change from to Simvastatin for $13.  Wynonia HazardDevina stated she will let the pt know this and advise her to continue what she is currently taking.  Message sent to Dr Selena BattenKim.

## 2018-01-30 ENCOUNTER — Other Ambulatory Visit (INDEPENDENT_AMBULATORY_CARE_PROVIDER_SITE_OTHER): Payer: Self-pay | Admitting: Family Medicine

## 2018-01-30 DIAGNOSIS — I10 Essential (primary) hypertension: Secondary | ICD-10-CM

## 2018-02-02 ENCOUNTER — Telehealth (INDEPENDENT_AMBULATORY_CARE_PROVIDER_SITE_OTHER): Payer: Self-pay | Admitting: Family Medicine

## 2018-02-02 NOTE — Telephone Encounter (Signed)
Wanda ChessmanSuzanne called in with questions about her refill on Metoprolol.  She was told by [facility] it couldn't be refilled.  She will take her last pill 02/03/18.

## 2018-02-02 NOTE — Telephone Encounter (Signed)
Patient prescription that was sent over on 01/21/18 is getting ready for pick up now and the patient will be contacted for pick up. Ragina Fenter, CMA

## 2018-02-12 ENCOUNTER — Ambulatory Visit (INDEPENDENT_AMBULATORY_CARE_PROVIDER_SITE_OTHER): Payer: BC Managed Care – PPO | Admitting: Family Medicine

## 2018-02-12 ENCOUNTER — Encounter (INDEPENDENT_AMBULATORY_CARE_PROVIDER_SITE_OTHER): Payer: Self-pay | Admitting: Family Medicine

## 2018-02-12 VITALS — BP 148/79 | HR 71 | Temp 97.9°F | Ht 65.0 in | Wt 319.0 lb

## 2018-02-12 DIAGNOSIS — E559 Vitamin D deficiency, unspecified: Secondary | ICD-10-CM

## 2018-02-12 DIAGNOSIS — I10 Essential (primary) hypertension: Secondary | ICD-10-CM | POA: Diagnosis not present

## 2018-02-12 DIAGNOSIS — Z9189 Other specified personal risk factors, not elsewhere classified: Secondary | ICD-10-CM | POA: Diagnosis not present

## 2018-02-12 DIAGNOSIS — Z6841 Body Mass Index (BMI) 40.0 and over, adult: Secondary | ICD-10-CM

## 2018-02-12 DIAGNOSIS — E119 Type 2 diabetes mellitus without complications: Secondary | ICD-10-CM

## 2018-02-12 MED ORDER — LOSARTAN POTASSIUM 100 MG PO TABS: 100.0000 mg | ORAL_TABLET | Freq: Every day | ORAL | 0 refills | Status: DC

## 2018-02-13 LAB — COMPREHENSIVE METABOLIC PANEL
ALT: 65 IU/L — ABNORMAL HIGH (ref 0–32)
AST: 30 IU/L (ref 0–40)
Albumin/Globulin Ratio: 1.5 (ref 1.2–2.2)
Albumin: 4.5 g/dL (ref 3.5–5.5)
Alkaline Phosphatase: 161 IU/L — ABNORMAL HIGH (ref 39–117)
BUN/Creatinine Ratio: 27 — ABNORMAL HIGH (ref 9–23)
BUN: 15 mg/dL (ref 6–24)
Bilirubin Total: 0.7 mg/dL (ref 0.0–1.2)
CO2: 25 mmol/L (ref 20–29)
Calcium: 9.6 mg/dL (ref 8.7–10.2)
Chloride: 99 mmol/L (ref 96–106)
Creatinine, Ser: 0.56 mg/dL — ABNORMAL LOW (ref 0.57–1.00)
GFR calc Af Amer: 124 mL/min/{1.73_m2} (ref >59)
GFR calc non Af Amer: 108 mL/min/{1.73_m2} (ref >59)
Globulin, Total: 3 g/dL (ref 1.5–4.5)
Glucose: 91 mg/dL (ref 65–99)
Potassium: 3.7 mmol/L (ref 3.5–5.2)
Sodium: 140 mmol/L (ref 134–144)
Total Protein: 7.5 g/dL (ref 6.0–8.5)

## 2018-02-13 LAB — HEMOGLOBIN A1C
Est. average glucose Bld gHb Est-mCnc: 126 mg/dL
Hgb A1c MFr Bld: 6 % — ABNORMAL HIGH (ref 4.8–5.6)

## 2018-02-13 LAB — MICROALBUMIN / CREATININE URINE RATIO
Creatinine, Urine: 8.8 mg/dL
Microalbumin, Urine: <3 ug/mL

## 2018-02-13 LAB — INSULIN, RANDOM: INSULIN: 8.9 u[IU]/mL (ref 2.6–24.9)

## 2018-02-13 LAB — VITAMIN D 25 HYDROXY (VIT D DEFICIENCY, FRACTURES): Vit D, 25-Hydroxy: 52.4 ng/mL (ref 30.0–100.0)

## 2018-02-13 LAB — HM DIABETES EYE EXAM

## 2018-02-16 ENCOUNTER — Encounter (INDEPENDENT_AMBULATORY_CARE_PROVIDER_SITE_OTHER): Payer: Self-pay | Admitting: Family Medicine

## 2018-02-16 DIAGNOSIS — E559 Vitamin D deficiency, unspecified: Secondary | ICD-10-CM | POA: Insufficient documentation

## 2018-02-16 DIAGNOSIS — Z6841 Body Mass Index (BMI) 40.0 and over, adult: Secondary | ICD-10-CM

## 2018-02-16 NOTE — Progress Notes (Signed)
Office: [phone]  /  Fax: [phone]   HPI:   Chief Complaint: OBESITY [name] is here to discuss her progress with her obesity treatment plan. She is on the keep a food journal with 550 to 700 calories and 45+ grams of protein at supper daily and the Category 4 plan and is following her eating plan approximately 40 % of the time. She states she is exercising 0 minutes 0 times per week. [name] did well over the holidays, despite holiday parties and travel. Her weight is (!) 319 lb (144.7 kg) today and has had a weight loss of 4 pounds over a period of 3 weeks since her last visit. She has lost 59 lbs since starting treatment with [place].  Vitamin D deficiency [name] has a diagnosis of vitamin D deficiency. [name] is currently taking vit D and denies nausea, vomiting or muscle weakness. Her last level was at 39 on 10/20/17 and is not at goal.  Diabetes II [name] has a diagnosis of diabetes type II. [name] states fasting BGs range between 105 and 128 and 2 hour post prandial BGs range between 96 and 127. She denies any hypoglycemic episodes or polyphagia. Last A1c was at 7.2.  She has been working on intensive lifestyle modifications including diet, exercise, and weight loss to help control her blood glucose levels.  Hypertension [name] is a 53 y.o. female with hypertension. Her home blood pressures are 140/76, 120/74 and 115/55. She is compliant with Losartan 50 mg daily and metoprolol 50 mg daily. [name] denies chest pain or shortness of breath on exertion. She is working weight loss to help control her blood pressure with the goal of decreasing her risk of heart attack and stroke. [name] blood pressure is not well controlled.  At risk for cardiovascular disease [name] is at a higher than average risk for cardiovascular disease due to obesity, diabetes and hypertension. She currently denies any chest pain.  ASSESSMENT AND PLAN:  Vitamin D deficiency -  Plan: VITAMIN D 25 Hydroxy (Vit-D Deficiency, Fractures)  Type 2 diabetes mellitus without complication, without long-term current use of insulin (HCC) - Plan: Comprehensive metabolic panel, Hemoglobin A1c, Insulin, random  Essential hypertension - Plan: losartan (COZAAR) 100 MG tablet  At risk for heart disease  Class 3 severe obesity with serious comorbidity and body mass index (BMI) of 50.0 to 59.9 in adult, unspecified obesity type ([name])  PLAN:  Vitamin D Deficiency [name] was informed that low vitamin D levels contributes to fatigue and are associated with obesity, breast, and colon cancer. She agrees to continue to take prescription Vit D _0 ,000 IU every week and will follow up for routine testing of vitamin D, at least 2-3 times per year. She was informed of the risk of over-replacement of vitamin D and agrees to not increase her dose unless she discusses this with [place] first. We will check vitamin D level today and [name] will follow up at the agreed upon time.  Diabetes II [name] has been given extensive diabetes education by myself today including ideal fasting and post-prandial blood glucose readings, individual ideal Hgb A1c goals and hypoglycemia prevention. We discussed the importance of good blood sugar control to decrease the likelihood of diabetic complications such as nephropathy, neuropathy, limb loss, blindness, coronary artery disease, and death. We discussed the importance of intensive lifestyle modification including diet, exercise and weight loss as the first line treatment for diabetes. We will check A1c, fasting insulin level and fasting glucose today.  Ece agrees to continue metformin and will follow up at the agreed upon time.  Hypertension We discussed sodium restriction, working on healthy weight loss, and a regular exercise program as the means to achieve improved blood pressure control. [name] agreed with this plan and agreed to follow up as directed. We will  continue to monitor her blood pressure as well as her progress with the above lifestyle modifications. She agrees to increase Losartan to 100 mg daily #30 with no refills and will watch for signs of hypotension as she continues her lifestyle modifications.  Cardiovascular risk counseling [name] was given extended (15 minutes) coronary artery disease prevention counseling today. She is 53 y.o. female and has risk factors for heart disease including obesity, diabetes and hypertension. We discussed intensive lifestyle modifications today with an emphasis on specific weight loss instructions and strategies. Pt was also informed of the importance of increasing exercise and decreasing saturated fats to help prevent heart disease.  Obesity [name] is currently in the action stage of change. As such, her goal is to continue with weight loss efforts She has agreed to keep a food journal with 550 to 700 calories and 45 grams of protein and follow the Category 4 plan [name] has been instructed to walk for 30 minutes 3 times per week for weight loss and overall health benefits. We discussed the following Behavioral Modification Strategies today: planning for success  [name] has agreed to follow up with our clinic in 2 weeks. She was informed of the importance of frequent follow up visits to maximize her success with intensive lifestyle modifications for her multiple health conditions.  ALLERGIES: No Known Allergies  MEDICATIONS: Current Outpatient Medications on File Prior to Visit  Medication Sig Dispense Refill  . Ascorbic Acid (VITAMIN C PO) Take by mouth.    [name] atorvastatin (LIPITOR) 20 MG tablet Take 1 tablet (20 mg total) by mouth daily. 90 tablet 3  . blood glucose meter kit and supplies Dispense based on patient and insurance preference. Use up to twice daily as directed. (FOR ICD-10 E10.9, E11.9). 1 each 0  . [facility] ASPIRIN LOW STRENGTH 81 MG EC tablet TAKE 1 TABLET BY MOUTH EVERY DAY 90 tablet 1    . furosemide (LASIX) 40 MG tablet TAKE 1 TABLET BY MOUTH EVERY DAY 90 tablet 1  . glucose blood (ONE TOUCH ULTRA TEST) test strip USE TO TEST BLOOD GLUCOSE 2 TIMES DAILY AS DIRECTED 100 each 0  . ibuprofen (ADVIL,MOTRIN) 200 MG tablet Take 200 mg by mouth every 6 (six) hours as needed.    [name] levonorgestrel (MIRENA) 20 MCG/24HR IUD 1 each by Intrauterine route once.    . metFORMIN (GLUCOPHAGE) 1000 MG tablet TAKE 1 TABLET (1,000 MG TOTAL) BY MOUTH 2 (TWO) TIMES DAILY WITH A MEAL. 180 tablet 1  . metoprolol succinate (TOPROL-XL) 50 MG 24 hr tablet Take 1 tablet (50 mg total) by mouth daily. Take with or immediately following a meal. 30 tablet 0  . Multiple Vitamins-Minerals (WOMENS DAILY FORMULA PO) Take 1 tablet by mouth daily.    Glory Rosebush DELICA LANCETS 82U MISC USE TO TEST BLOOD GLUCOSE UP TO 2 TIMES DAILY AS DIRECTED 100 each 0  . Polyethyl Glycol-Propyl Glycol (SYSTANE) 0.4-0.3 % SOLN Apply to eye.    . Pseudoephedrine-Acetaminophen (ALKA-SELTZER PLUS COLD/SINUS PO) Take by mouth.    . Vitamin D, Ergocalciferol, (DRISDOL) 1.25 MG (50000 UT) CAPS capsule Take 1 capsule (50,000 Units total) by mouth every 7 (seven) days. 4 capsule 0  Current Facility-Administered Medications on File Prior to Visit  Medication Dose Route Frequency Provider Last Rate Last Dose  . levonorgestrel (MIRENA) 20 MCG/24HR IUD   Intrauterine Once [name], [name], MD        PAST MEDICAL HISTORY: Past Medical History:  Diagnosis Date  . Allergy   . Back pain   . CHF (congestive heart failure) ([place])   . Diabetes ([place])   . Dyspnea   . Gallbladder problem   . GERD (gastroesophageal reflux disease)   . Hip pain   . Hyperlipemia   . Hypertension   . Knee pain   . Leg edema   . Obesity   . Sleep apnea   . Venous insufficiency     PAST SURGICAL HISTORY: Past Surgical History:  Procedure Laterality Date  . Birth Elta [place] removed    . CHOLECYSTECTOMY    . INTRAUTERINE DEVICE INSERTION  12/31/2013   Mirena     SOCIAL HISTORY: Social History   Tobacco Use  . Smoking status: Never Smoker  . Smokeless tobacco: Never Used  Substance Use Topics  . Alcohol use: Yes    Alcohol/week: 0.0 standard drinks    Comment: Rare  . Drug use: No    FAMILY HISTORY: Family History  Problem Relation Age of Onset  . Hypertension Father   . Diabetes Father   . Obesity Father   . Cancer Paternal Grandfather        Stomach  . Cancer Maternal Uncle        Pancreatic  . Cancer Paternal Uncle        Kidney  . Hypertension Mother   . Anxiety disorder Mother     ROS: Review of Systems  Constitutional: Positive for weight loss.  Respiratory: Negative for shortness of breath (on exertion).   Cardiovascular: Negative for chest pain.  Gastrointestinal: Negative for nausea and vomiting.  Musculoskeletal:       Negative for muscle weakness  Endo/Heme/Allergies:       Negative for hypoglycemia Negative for polyphagia    PHYSICAL EXAM: Blood pressure (!) 148/79, pulse 71, temperature 97.9 F (36.6 C), temperature source Oral, height _0  (1.651 m), weight (!) 319 lb (144.7 kg), SpO2 99 %. Body mass index is 53.08 kg/m. Physical Exam Vitals signs reviewed.  Constitutional:      Appearance: Normal appearance. She is well-developed. She is obese.  Cardiovascular:     Rate and Rhythm: Normal rate.  Pulmonary:     Effort: Pulmonary effort is normal.  Musculoskeletal: Normal range of motion.  Skin:    General: Skin is warm and dry.  Neurological:     Mental Status: She is alert and oriented to person, place, and time.  Psychiatric:        Mood and Affect: Mood normal.        Behavior: Behavior normal.     RECENT LABS AND TESTS: BMET    Component Value Date/Time   NA 140 02/12/2018 1315   K 3.7 02/12/2018 1315   CL 99 02/12/2018 1315   CO2 25 02/12/2018 1315   GLUCOSE 91 02/12/2018 1315   GLUCOSE 115 (H) 04/10/2017 1656   BUN 15 02/12/2018 1315   CREATININE 0.56 (L) 02/12/2018 1315    CALCIUM 9.6 02/12/2018 1315   GFRNONAA 108 02/12/2018 1315   GFRAA 124 02/12/2018 1315   Lab Results  Component Value Date   HGBA1C 6.0 (H) 02/12/2018   HGBA1C 7.2 (H) 10/20/2017   HGBA1C 7.3 (H) 07/14/2017  HGBA1C 7.7 (H) 04/03/2017   HGBA1C 7.4 (H) 09/13/2016   Lab Results  Component Value Date   INSULIN 8.9 02/12/2018   INSULIN 9.1 10/20/2017   CBC    Component Value Date/Time   WBC 7.9 10/20/2017 0940   WBC 10.6 (H) 04/05/2017 0443   RBC 5.35 (H) 10/20/2017 0940   RBC 5.16 (H) 04/05/2017 0443   HGB 15.0 10/20/2017 0940   HCT 48.0 (H) 10/20/2017 0940   PLT 265 04/05/2017 0443   MCV 90 10/20/2017 0940   MCH 28.0 10/20/2017 0940   MCH 27.3 04/05/2017 0443   MCHC 31.3 (L) 10/20/2017 0940   MCHC 29.4 (L) 04/05/2017 0443   RDW 16.0 (H) 10/20/2017 0940   LYMPHSABS 1.2 10/20/2017 0940   MONOABS 0.7 04/05/2017 0443   EOSABS 0.1 10/20/2017 0940   BASOSABS 0.1 10/20/2017 0940   Iron/TIBC/Ferritin/ %Sat No results found for: IRON, TIBC, FERRITIN, IRONPCTSAT Lipid Panel     Component Value Date/Time   CHOL 127 10/20/2017 0940   TRIG 91 10/20/2017 0940   HDL 47 10/20/2017 0940   CHOLHDL 3 09/13/2016 0958   VLDL 21.8 09/13/2016 0958   LDLCALC 62 10/20/2017 0940   Hepatic Function Panel     Component Value Date/Time   PROT 7.5 02/12/2018 1315   ALBUMIN 4.5 02/12/2018 1315   AST 30 02/12/2018 1315   ALT 65 (H) 02/12/2018 1315   ALKPHOS 161 (H) 02/12/2018 1315   BILITOT 0.7 02/12/2018 1315      Component Value Date/Time   TSH 2.790 10/20/2017 0940   Results for Wanda, Morris (MRN [id number]) as of 02/16/2018 07:42  Ref. Range 10/20/2017 09:40  Vitamin D, 25-Hydroxy Latest Ref Range: 30.0 - 100.0 ng/mL 39.0     OBESITY BEHAVIORAL INTERVENTION VISIT  Today's visit was # 7   Starting weight: 378 lbs Starting date: 10/20/2017 Today's weight : 319 lbs Today's date: 02/12/2018 Total lbs lost to date: 42   ASK: We discussed the diagnosis of obesity  with [name] today and Tayte agreed to give [place] permission to discuss obesity behavioral modification therapy today.  ASSESS: Lovie has the diagnosis of obesity and her BMI today is 53.08 Tameia is in the action stage of change   ADVISE: Mazzie was educated on the multiple health risks of obesity as well as the benefit of weight loss to improve her health. She was advised of the need for long term treatment and the importance of lifestyle modifications to improve her current health and to decrease her risk of future health problems.  AGREE: Multiple dietary modification options and treatment options were discussed and  Avaeh agreed to follow the recommendations documented in the above note.  ARRANGE: Rayshawn was educated on the importance of frequent visits to treat obesity as outlined per CMS and USPSTF guidelines and agreed to schedule her next follow up appointment today.  Corey Skains, am acting as Location manager for [employer], FNP-C.  I have reviewed the above documentation for accuracy and completeness, and I agree with the above.  - Emrah Ariola, FNP-C.

## 2018-02-21 ENCOUNTER — Other Ambulatory Visit (INDEPENDENT_AMBULATORY_CARE_PROVIDER_SITE_OTHER): Payer: Self-pay | Admitting: Family Medicine

## 2018-02-21 DIAGNOSIS — E559 Vitamin D deficiency, unspecified: Secondary | ICD-10-CM

## 2018-02-23 MED ORDER — VITAMIN D (ERGOCALCIFEROL) 1.25 MG (50000 UNIT) PO CAPS: 50000.0000 [IU] | ORAL_CAPSULE | ORAL | 0 refills | Status: DC

## 2018-02-26 ENCOUNTER — Encounter: Payer: Self-pay | Admitting: Family Medicine

## 2018-03-01 DIAGNOSIS — E1169 Type 2 diabetes mellitus with other specified complication: Secondary | ICD-10-CM | POA: Insufficient documentation

## 2018-03-01 DIAGNOSIS — E119 Type 2 diabetes mellitus without complications: Secondary | ICD-10-CM | POA: Insufficient documentation

## 2018-03-01 DIAGNOSIS — F339 Major depressive disorder, recurrent, unspecified: Secondary | ICD-10-CM | POA: Insufficient documentation

## 2018-03-01 DIAGNOSIS — E785 Hyperlipidemia, unspecified: Principal | ICD-10-CM

## 2018-03-01 NOTE — Progress Notes (Signed)
HPI:  Using dictation device. Unfortunately this device frequently misinterprets words/phrases.  [name] is a pleasant 53 y.o. here for follow up. Chronic medical problems summarized below were reviewed for changes and stability and were updated as needed below. These issues and their treatment remain stable for the most part.  Did labs with the weight management clinic recently and her hgba1c was 6.0. She continues to reduce her weight. She has had a mildly elevated Alk phos on recent labs with weight management and vit D is being repleted and was normal recently. ALT was also elevated a little. She reports the [facility] management [facility] provider addressed this with her and will be rechecking. They plan to do [place] if remains elevated. She declined evaluation her. She denies alcohol or tylenol use. She denies abd pain, CP, SOB, bleeding issues.   She sees Dr. Phineas Real in gynecology for her women's health.    Morbid obesity: -seeing Dr. Leafy Ro at the [place] wt management clinic -has lost some weight recently, 19 lbs -wt 386 (9/16) --> 383 (8/18) --> 357 (9/19) --> (311!) 02/2018  MDD, recurrent: -long standing, recurrent depressed mood -see phq9 -seeing Dr. Mallie Mussel in psychiatry for managment  HTN w/ diabetes: -meds: losartan, metoprolol recently added by Dr. Leafy Ro, lasix  Hyperlip with diabetes: -meds: atorvastatin  OSA: -tested in 2019, now on CPAP  Diabetes with complications: -with hyperglycemia and hyperlipemia and hypertension -meds: metformin -working on diet with obesity clinic  Vit D def: -seeing Dr. Leafy Ro for this and taking vit D  ROS: See pertinent positives and negatives per HPI.  Past Medical History:  Diagnosis Date  . Allergy   . Back pain   . CHF (congestive heart failure) ([place])   . Diabetes ([place])   . Dyspnea   . Gallbladder problem   . GERD (gastroesophageal reflux disease)   . Hip pain   . Hyperlipemia   . Hypertension   . Knee  pain   . Leg edema   . Obesity   . Sleep apnea   . Venous insufficiency     Past Surgical History:  Procedure Laterality Date  . Birth Elta [place] removed    . CHOLECYSTECTOMY    . INTRAUTERINE DEVICE INSERTION  12/31/2013   Mirena    Family History  Problem Relation Age of Onset  . Hypertension Father   . Diabetes Father   . Obesity Father   . Cancer Paternal Grandfather        Stomach  . Cancer Maternal Uncle        Pancreatic  . Cancer Paternal Uncle        Kidney  . Hypertension Mother   . Anxiety disorder Mother     SOCIAL HX: see hpi, no alcohol use   Current Outpatient Medications:  .  Ascorbic Acid (VITAMIN C PO), Take by mouth., Disp: , Rfl:  .  atorvastatin (LIPITOR) 20 MG tablet, Take 1 tablet (20 mg total) by mouth daily., Disp: 90 tablet, Rfl: 3 .  blood glucose meter kit and supplies, Dispense based on patient and insurance preference. Use up to twice daily as directed. (FOR ICD-10 E10.9, E11.9)., Disp: 1 each, Rfl: 0 .  [facility] ASPIRIN LOW STRENGTH 81 MG EC tablet, TAKE 1 TABLET BY MOUTH EVERY DAY, Disp: 90 tablet, Rfl: 1 .  furosemide (LASIX) 40 MG tablet, TAKE 1 TABLET BY MOUTH EVERY DAY, Disp: 90 tablet, Rfl: 1 .  glucose blood (ONE TOUCH ULTRA TEST) test strip, USE TO TEST BLOOD  GLUCOSE 2 TIMES DAILY AS DIRECTED, Disp: 100 each, Rfl: 0 .  ibuprofen (ADVIL,MOTRIN) 200 MG tablet, Take 200 mg by mouth every 6 (six) hours as needed., Disp: , Rfl:  .  levonorgestrel (MIRENA) 20 MCG/24HR IUD, 1 each by Intrauterine route once., Disp: , Rfl:  .  losartan (COZAAR) 100 MG tablet, Take 1 tablet (100 mg total) by mouth daily., Disp: 30 tablet, Rfl: 0 .  metFORMIN (GLUCOPHAGE) 1000 MG tablet, TAKE 1 TABLET (1,000 MG TOTAL) BY MOUTH 2 (TWO) TIMES DAILY WITH A MEAL., Disp: 180 tablet, Rfl: 1 .  metoprolol succinate (TOPROL-XL) 50 MG 24 hr tablet, Take 1 tablet (50 mg total) by mouth daily. Take with or immediately following a meal., Disp: 30 tablet, Rfl: 0 .  ONETOUCH DELICA  LANCETS 33G MISC, USE TO TEST BLOOD GLUCOSE UP TO 2 TIMES DAILY AS DIRECTED, Disp: 100 each, Rfl: 0 .  Polyethyl Glycol-Propyl Glycol (SYSTANE) 0.4-0.3 % SOLN, Apply to eye., Disp: , Rfl:  .  Pseudoephedrine-Acetaminophen (ALKA-SELTZER PLUS COLD/SINUS PO), Take by mouth., Disp: , Rfl:  .  Vitamin D, Cholecalciferol, 50 MCG (2000 UT) CAPS, Take 1 capsule by mouth daily., Disp: , Rfl:   Current Facility-Administered Medications:  .  levonorgestrel (MIRENA) 20 MCG/24HR IUD, , Intrauterine, Once, [name], Timothy P, MD  EXAM:  Vitals:   03/03/18 0731  BP: 120/78  Pulse: (!) 58  Temp: (!) 97.4 F (36.3 C)    Body mass index is 51.8 kg/m.  GENERAL: vitals reviewed and listed above, alert, oriented, appears well hydrated and in no acute distress  HEENT: atraumatic, conjunttiva clear, no obvious abnormalities on inspection of external nose and ears  NECK: no obvious masses on inspection  LUNGS: clear to auscultation bilaterally, no wheezes, rales or rhonchi, good air movement  CV: HRRR, no peripheral edema  MS: moves all extremities without noticeable abnormality  PSYCH: pleasant and cooperative, no obvious depression or anxiety  ASSESSMENT AND PLAN:  Discussed the following assessment and plan:  Hyperlipidemia associated with type 2 diabetes mellitus (HCC)  Type 2 diabetes mellitus with other specified complication, without long-term current use of insulin (HCC)  Depression, recurrent (HCC)  Class 3 severe obesity with serious comorbidity and body mass index (BMI) of 50.0 to 59.9 in adult, unspecified obesity type (HCC)  Transaminitis  Hypertension associated with diabetes (HCC)  Chronic diastolic congestive heart failure (HCC)  -reviewed LFTs from wt clinic and implications, potential etiologies - she reports this was discussed and addressed there and that they have a plan to recheck/evaluate and declines evaluation here -discussed current medications and ? Need for  asa therapy - advised to ask her cardiologist -congratulated on amazing weight reduction and encouraged to continue healthy diet and regular exercise -follow up 4-6 months given seeing wt management clinic currently for management, follow up sooner as needed -discussed shingles vaccine and she plans to check on cost  Patient Instructions  BEFORE YOU LEAVE: -follow up: 4-6 months  Continue care with the Weight Management Clinic.   Check with your cardiologist about whether or not you could stop aspirin therapy.  Keep up the great work!   Hannah R Kim, DO   

## 2018-03-02 ENCOUNTER — Encounter (INDEPENDENT_AMBULATORY_CARE_PROVIDER_SITE_OTHER): Payer: Self-pay | Admitting: Family Medicine

## 2018-03-02 ENCOUNTER — Ambulatory Visit (INDEPENDENT_AMBULATORY_CARE_PROVIDER_SITE_OTHER): Payer: BC Managed Care – PPO | Admitting: Family Medicine

## 2018-03-02 VITALS — BP 126/73 | HR 59 | Temp 97.8°F | Ht 65.0 in | Wt 308.0 lb

## 2018-03-02 DIAGNOSIS — R748 Abnormal levels of other serum enzymes: Secondary | ICD-10-CM | POA: Diagnosis not present

## 2018-03-02 DIAGNOSIS — E559 Vitamin D deficiency, unspecified: Secondary | ICD-10-CM | POA: Diagnosis not present

## 2018-03-02 DIAGNOSIS — E119 Type 2 diabetes mellitus without complications: Secondary | ICD-10-CM | POA: Diagnosis not present

## 2018-03-02 DIAGNOSIS — I10 Essential (primary) hypertension: Secondary | ICD-10-CM

## 2018-03-02 DIAGNOSIS — Z6841 Body Mass Index (BMI) 40.0 and over, adult: Secondary | ICD-10-CM

## 2018-03-02 DIAGNOSIS — Z9189 Other specified personal risk factors, not elsewhere classified: Secondary | ICD-10-CM | POA: Diagnosis not present

## 2018-03-02 MED ORDER — METOPROLOL SUCCINATE ER 50 MG PO TB24: 50.0000 mg | ORAL_TABLET | Freq: Every day | ORAL | 0 refills | Status: DC

## 2018-03-03 ENCOUNTER — Ambulatory Visit: Payer: BC Managed Care – PPO | Admitting: Family Medicine

## 2018-03-03 ENCOUNTER — Encounter: Payer: Self-pay | Admitting: Family Medicine

## 2018-03-03 VITALS — BP 120/78 | HR 58 | Temp 97.4°F | Ht 65.0 in | Wt 311.3 lb

## 2018-03-03 DIAGNOSIS — F339 Major depressive disorder, recurrent, unspecified: Secondary | ICD-10-CM

## 2018-03-03 DIAGNOSIS — Z6841 Body Mass Index (BMI) 40.0 and over, adult: Secondary | ICD-10-CM

## 2018-03-03 DIAGNOSIS — E1169 Type 2 diabetes mellitus with other specified complication: Secondary | ICD-10-CM

## 2018-03-03 DIAGNOSIS — E785 Hyperlipidemia, unspecified: Secondary | ICD-10-CM

## 2018-03-03 DIAGNOSIS — R740 Nonspecific elevation of levels of transaminase and lactic acid dehydrogenase [LDH]: Secondary | ICD-10-CM

## 2018-03-03 DIAGNOSIS — I10 Essential (primary) hypertension: Secondary | ICD-10-CM

## 2018-03-03 DIAGNOSIS — E1159 Type 2 diabetes mellitus with other circulatory complications: Secondary | ICD-10-CM

## 2018-03-03 DIAGNOSIS — R7401 Elevation of levels of liver transaminase levels: Secondary | ICD-10-CM

## 2018-03-03 DIAGNOSIS — I5032 Chronic diastolic (congestive) heart failure: Secondary | ICD-10-CM

## 2018-03-03 NOTE — Progress Notes (Signed)
Office: [phone]  /  Fax: [phone]   HPI:   Chief Complaint: OBESITY [name] is here to discuss her progress with her obesity treatment plan. She is on the Category 3 plan and is following her eating plan approximately 95 % of the time. She states she is walking 30 to 45 minutes 3 to 4 times per week. [name] is eating all of her food. She has had a few meals that were off the plan. [name] continues to do well with weight loss. Her weight is (!) 308 lb (139.7 kg) today and has had a weight loss of 11 pounds over a period of 2 to 3 weeks since her last visit. She has lost 70 lbs since starting treatment with [place].  Hypertension [name] is a 53 y.o. female with hypertension. [name] denies chest pain or shortness of breath on exertion. She is working weight loss to help control her blood pressure with the goal of decreasing her risk of heart attack and stroke. [name] blood pressure is well controlled on Metoprolol and Losartan. Her Losartan dose was increased at the last visit.  Diabetes II [name] has a diagnosis of diabetes type II. She is currently taking Metformin. [name] states fasting BGs range between 101 and 122 and 2 hour post prandial BGs range between 106 and 134. [name] denies any hypoglycemic episodes or polyphagia. Last A1c was at 6.0.  She has been working on intensive lifestyle modifications including diet, exercise, and weight loss to help control her blood glucose levels.  At risk for cardiovascular disease [name] is at a higher than average risk for cardiovascular disease due to obesity, hypertension and diabetes. She currently denies any chest pain.  Elevated Alkaline Phosphatase [name] has an elevated alkaline phosphatase of 161 and she denies taking Tylenol. She denies any excessive alcohol intake. Her alkaline phosphatase was elevated from her last check. Her ALT was elevated also. This is likely fatty liver. She is currently on a  statin.  Vitamin D deficiency [name] has a diagnosis of vitamin D deficiency. She is currently taking vit D and her vitamin D level is at goal. [name] denies nausea, vomiting or muscle weakness.  ASSESSMENT AND PLAN:  Essential hypertension - Plan: metoprolol succinate (TOPROL-XL) 50 MG 24 hr tablet  Type 2 diabetes mellitus without complication, without long-term current use of insulin (HCC)  Elevated alkaline phosphatase level  Vitamin D deficiency  At risk for heart disease  Class 3 severe obesity with serious comorbidity and body mass index (BMI) of 50.0 to 59.9 in adult, unspecified obesity type ([place])  PLAN:  Hypertension We discussed sodium restriction, working on healthy weight loss, and a regular exercise program as the means to achieve improved blood pressure control. [name] agreed with this plan and agreed to follow up as directed. We will continue to monitor her blood pressure as well as her progress with the above lifestyle modifications. She agrees to continue Metoprolol 50 mg 24 hr tablet daily and take with food #30 with no refills. She will continue Losartan and metroprolol as prescribed and will watch for signs of hypotension as she continues her lifestyle modifications.  Diabetes II [name] has been given extensive diabetes education by myself today including ideal fasting and post-prandial blood glucose readings, individual ideal Hgb A1c goals and hypoglycemia prevention. We discussed the importance of good blood sugar control to decrease the likelihood of diabetic complications such as nephropathy, neuropathy, limb loss, blindness, coronary artery disease, and death. We discussed  the importance of intensive lifestyle modification including diet, exercise and weight loss as the first line treatment for diabetes. [name] agrees to continue Metformin and will follow up at the agreed upon time.  Cardiovascular risk counseling [name] was given extended (15 minutes)  coronary artery disease prevention counseling today. She is 53 y.o. female and has risk factors for heart disease including obesity, hypertension and diabetes. We discussed intensive lifestyle modifications today with an emphasis on specific weight loss instructions and strategies. Pt was also informed of the importance of increasing exercise and decreasing saturated fats to help prevent heart disease.  Elevated Alkaline Phosphatase I have advised [name] to check with her PCP about her elevated alkaline phosphatase to see if she would like to to further evaluation. . We will recheck her alkaline phosphatase and liver enzymes in 2 months. She will avoid alcohol and Tylenol. [name] will follow up with our clinic in 2 weeks.  Vitamin D Deficiency [name] was informed that low vitamin D levels contributes to fatigue and are associated with obesity, breast, and colon cancer. She agrees to discontinue prescription Vit D '@50' ,000 IU every week and start OTC Vit D 3 2,000 IU daily  and will follow up for routine testing of vitamin D, at least 2-3 times per year. She was informed of the risk of over-replacement of vitamin D and agrees to not increase her dose unless she discusses this with [place] first. [name] agrees to follow up with our clinic in 2 weeks.  Obesity [name] is currently in the action stage of change. As such, her goal is to continue with weight loss efforts She has agreed to follow the Category 4 plan [name] will continue walking for 30 to 45 minutes 3 to 4 times per week for weight loss and overall health benefits. We discussed the following Behavioral Modification Strategies today: planning for success  [name] has agreed to follow up with our clinic in 2 weeks. She was informed of the importance of frequent follow up visits to maximize her success with intensive lifestyle modifications for her multiple health conditions.  ALLERGIES: No Known Allergies  MEDICATIONS: Current Outpatient  Medications on File Prior to Visit  Medication Sig Dispense Refill  . Ascorbic Acid (VITAMIN C PO) Take by mouth.    [name] atorvastatin (LIPITOR) 20 MG tablet Take 1 tablet (20 mg total) by mouth daily. 90 tablet 3  . blood glucose meter kit and supplies Dispense based on patient and insurance preference. Use up to twice daily as directed. (FOR ICD-10 E10.9, E11.9). 1 each 0  . [facility] ASPIRIN LOW STRENGTH 81 MG EC tablet TAKE 1 TABLET BY MOUTH EVERY DAY 90 tablet 1  . furosemide (LASIX) 40 MG tablet TAKE 1 TABLET BY MOUTH EVERY DAY 90 tablet 1  . glucose blood (ONE TOUCH ULTRA TEST) test strip USE TO TEST BLOOD GLUCOSE 2 TIMES DAILY AS DIRECTED 100 each 0  . ibuprofen (ADVIL,MOTRIN) 200 MG tablet Take 200 mg by mouth every 6 (six) hours as needed.    [name] levonorgestrel (MIRENA) 20 MCG/24HR IUD 1 each by Intrauterine route once.    [name] losartan (COZAAR) 100 MG tablet Take 1 tablet (100 mg total) by mouth daily. 30 tablet 0  . metFORMIN (GLUCOPHAGE) 1000 MG tablet TAKE 1 TABLET (1,000 MG TOTAL) BY MOUTH 2 (TWO) TIMES DAILY WITH A MEAL. 180 tablet 1  . ONETOUCH DELICA LANCETS 98X MISC USE TO TEST BLOOD GLUCOSE UP TO 2 TIMES DAILY AS DIRECTED 100 each 0  .  Polyethyl Glycol-Propyl Glycol (SYSTANE) 0.4-0.3 % SOLN Apply to eye.    . Pseudoephedrine-Acetaminophen (ALKA-SELTZER PLUS COLD/SINUS PO) Take by mouth.    . Vitamin D, Cholecalciferol, 50 MCG (2000 UT) CAPS Take 1 capsule by mouth daily.     Current Facility-Administered Medications on File Prior to Visit  Medication Dose Route Frequency Provider Last Rate Last Dose  . levonorgestrel (MIRENA) 20 MCG/24HR IUD   Intrauterine Once [name], [name], MD        PAST MEDICAL HISTORY: Past Medical History:  Diagnosis Date  . Allergy   . Back pain   . CHF (congestive heart failure) ([place])   . Diabetes ([place])   . Dyspnea   . Gallbladder problem   . GERD (gastroesophageal reflux disease)   . Hip pain   . Hyperlipemia   . Hypertension   . Knee pain   .  Leg edema   . Obesity   . Sleep apnea   . Venous insufficiency     PAST SURGICAL HISTORY: Past Surgical History:  Procedure Laterality Date  . Birth [place] removed    . CHOLECYSTECTOMY    . INTRAUTERINE DEVICE INSERTION  12/31/2013   Mirena    SOCIAL HISTORY: Social History   Tobacco Use  . Smoking status: Never Smoker  . Smokeless tobacco: Never Used  Substance Use Topics  . Alcohol use: Yes    Alcohol/week: 0.0 standard drinks    Comment: Rare  . Drug use: No    FAMILY HISTORY: Family History  Problem Relation Age of Onset  . Hypertension Father   . Diabetes Father   . Obesity Father   . Cancer Paternal Grandfather        Stomach  . Cancer Maternal Uncle        Pancreatic  . Cancer Paternal Uncle        Kidney  . Hypertension Mother   . Anxiety disorder Mother     ROS: Review of Systems  Constitutional: Positive for weight loss.  Respiratory: Negative for shortness of breath (on exertion).   Cardiovascular: Negative for chest pain.  Gastrointestinal: Negative for nausea and vomiting.  Musculoskeletal:       Negative for muscle weakness    PHYSICAL EXAM: Blood pressure 126/73, pulse (!) 59, temperature 97.8 F (36.6 C), temperature source Oral, height '5\' 5"'  (1.651 m), weight (!) 308 lb (139.7 kg), SpO2 98 %. Body mass index is 51.25 kg/m. Physical Exam Vitals signs reviewed.  Constitutional:      Appearance: Normal appearance. She is well-developed. She is obese.  Cardiovascular:     Rate and Rhythm: Normal rate.  Pulmonary:     Effort: Pulmonary effort is normal.  Musculoskeletal: Normal range of motion.  Skin:    General: Skin is warm and dry.  Neurological:     Mental Status: She is alert and oriented to person, place, and time.  Psychiatric:        Mood and Affect: Mood normal.        Behavior: Behavior normal.     RECENT LABS AND TESTS: BMET    Component Value Date/Time   NA 140 02/12/2018 1315   K 3.7 02/12/2018 1315   CL 99  02/12/2018 1315   CO2 25 02/12/2018 1315   GLUCOSE 91 02/12/2018 1315   GLUCOSE 115 (H) 04/10/2017 1656   BUN 15 02/12/2018 1315   CREATININE 0.56 (L) 02/12/2018 1315   CALCIUM 9.6 02/12/2018 1315   GFRNONAA 108 02/12/2018 1315  GFRAA 124 02/12/2018 1315   Lab Results  Component Value Date   HGBA1C 6.0 (H) 02/12/2018   HGBA1C 7.2 (H) 10/20/2017   HGBA1C 7.3 (H) 07/14/2017   HGBA1C 7.7 (H) 04/03/2017   HGBA1C 7.4 (H) 09/13/2016   Lab Results  Component Value Date   INSULIN 8.9 02/12/2018   INSULIN 9.1 10/20/2017   CBC    Component Value Date/Time   WBC 7.9 10/20/2017 0940   WBC 10.6 (H) 04/05/2017 0443   RBC 5.35 (H) 10/20/2017 0940   RBC 5.16 (H) 04/05/2017 0443   HGB 15.0 10/20/2017 0940   HCT 48.0 (H) 10/20/2017 0940   PLT 265 04/05/2017 0443   MCV 90 10/20/2017 0940   MCH 28.0 10/20/2017 0940   MCH 27.3 04/05/2017 0443   MCHC 31.3 (L) 10/20/2017 0940   MCHC 29.4 (L) 04/05/2017 0443   RDW 16.0 (H) 10/20/2017 0940   LYMPHSABS 1.2 10/20/2017 0940   MONOABS 0.7 04/05/2017 0443   EOSABS 0.1 10/20/2017 0940   BASOSABS 0.1 10/20/2017 0940   Iron/TIBC/Ferritin/ %Sat No results found for: IRON, TIBC, FERRITIN, IRONPCTSAT Lipid Panel     Component Value Date/Time   CHOL 127 10/20/2017 0940   TRIG 91 10/20/2017 0940   HDL 47 10/20/2017 0940   CHOLHDL 3 09/13/2016 0958   VLDL 21.8 09/13/2016 0958   LDLCALC 62 10/20/2017 0940   Hepatic Function Panel     Component Value Date/Time   PROT 7.5 02/12/2018 1315   ALBUMIN 4.5 02/12/2018 1315   AST 30 02/12/2018 1315   ALT 65 (H) 02/12/2018 1315   ALKPHOS 161 (H) 02/12/2018 1315   BILITOT 0.7 02/12/2018 1315      Component Value Date/Time   TSH 2.790 10/20/2017 0940     Ref. Range 02/12/2018 13:15  Vitamin D, 25-Hydroxy Latest Ref Range: 30.0 - 100.0 ng/mL 52.4     OBESITY BEHAVIORAL INTERVENTION VISIT  Today's visit was # 8   Starting weight: 378 lbs Starting date: 10/30/2017 Today's weight : 308  lbs Today's date: 03/02/2018 Total lbs lost to date: 49   ASK: We discussed the diagnosis of obesity with [name] today and Kyleen agreed to give [place] permission to discuss obesity behavioral modification therapy today.  ASSESS: Sherese has the diagnosis of obesity and her BMI today is 51.25 Analayah is in the action stage of change   ADVISE: Elka was educated on the multiple health risks of obesity as well as the benefit of weight loss to improve her health. She was advised of the need for long term treatment and the importance of lifestyle modifications to improve her current health and to decrease her risk of future health problems.  AGREE: Multiple dietary modification options and treatment options were discussed and  Jimya agreed to follow the recommendations documented in the above note.  ARRANGE: Makenlee was educated on the importance of frequent visits to treat obesity as outlined per CMS and USPSTF guidelines and agreed to schedule her next follow up appointment today.  Corey Skains, am acting as Location manager for [employer], FNP-C.  I have reviewed the above documentation for accuracy and completeness, and I agree with the above.  - Camy Leder, FNP-C.

## 2018-03-03 NOTE — Patient Instructions (Signed)
BEFORE YOU LEAVE: -follow up: 4-6 months  Continue care with [facility].   Check with your cardiologist about whether or not you could stop aspirin therapy.  Keep up the great work!

## 2018-03-04 ENCOUNTER — Encounter (INDEPENDENT_AMBULATORY_CARE_PROVIDER_SITE_OTHER): Payer: Self-pay | Admitting: Family Medicine

## 2018-03-04 DIAGNOSIS — R748 Abnormal levels of other serum enzymes: Secondary | ICD-10-CM | POA: Insufficient documentation

## 2018-03-10 ENCOUNTER — Other Ambulatory Visit (INDEPENDENT_AMBULATORY_CARE_PROVIDER_SITE_OTHER): Payer: Self-pay | Admitting: Family Medicine

## 2018-03-10 DIAGNOSIS — I10 Essential (primary) hypertension: Secondary | ICD-10-CM

## 2018-03-11 ENCOUNTER — Encounter: Payer: Self-pay | Admitting: Family Medicine

## 2018-03-15 ENCOUNTER — Other Ambulatory Visit (INDEPENDENT_AMBULATORY_CARE_PROVIDER_SITE_OTHER): Payer: Self-pay | Admitting: Family Medicine

## 2018-03-15 DIAGNOSIS — E559 Vitamin D deficiency, unspecified: Secondary | ICD-10-CM

## 2018-03-17 ENCOUNTER — Encounter (INDEPENDENT_AMBULATORY_CARE_PROVIDER_SITE_OTHER): Payer: Self-pay | Admitting: Family Medicine

## 2018-03-17 ENCOUNTER — Ambulatory Visit (INDEPENDENT_AMBULATORY_CARE_PROVIDER_SITE_OTHER): Payer: BC Managed Care – PPO | Admitting: Family Medicine

## 2018-03-17 VITALS — BP 146/74 | HR 59 | Temp 97.9°F | Ht 65.0 in | Wt 303.0 lb

## 2018-03-17 DIAGNOSIS — E119 Type 2 diabetes mellitus without complications: Secondary | ICD-10-CM

## 2018-03-17 DIAGNOSIS — Z9189 Other specified personal risk factors, not elsewhere classified: Secondary | ICD-10-CM | POA: Diagnosis not present

## 2018-03-17 DIAGNOSIS — I10 Essential (primary) hypertension: Secondary | ICD-10-CM

## 2018-03-17 DIAGNOSIS — Z6841 Body Mass Index (BMI) 40.0 and over, adult: Secondary | ICD-10-CM

## 2018-03-17 MED ORDER — GLUCOSE BLOOD VI STRP: ORAL_STRIP | 0 refills | Status: DC

## 2018-03-18 ENCOUNTER — Encounter (INDEPENDENT_AMBULATORY_CARE_PROVIDER_SITE_OTHER): Payer: Self-pay | Admitting: Family Medicine

## 2018-03-18 NOTE — Progress Notes (Signed)
Office: [phone]  /  Fax: [phone]   HPI:   Chief Complaint: OBESITY [name] is here to discuss her progress with her obesity treatment plan. She is on the Category 4 plan and is following her eating plan approximately 90 % of the time. She states she is walking 30-45 minutes 3-4 times per week. [name] is doing very well with meal plan. She does not struggle with hunger and feels she had made this way of eating a "habit". Her weight is (!) 303 lb (137.4 kg) today and has had a weight loss of 5 pounds over a period of 2 weeks since her last visit. She has lost 75 lbs since starting treatment with [place].  Diabetes II [name] has a diagnosis of diabetes type II. [name] states Fasting BGs range between 102 and 124 and 2 hour Postprandial 93-115. She denies any polyphagia episodes. Blood sugar is well controlled on metformin. Last A1c was Hemoglobin A1C Latest Ref Rng & Units 02/12/2018 10/20/2017 07/14/2017 04/03/2017  HGBA1C 4.8 - 5.6 % 6.0(H) 7.2(H) 7.3(H) 7.7(H)  Some recent data might be hidden    She has been working on intensive lifestyle modifications including diet, exercise, and weight loss to help control her blood glucose levels.  Hypertension Moni Rothrock is a 53 y.o. female with hypertension.  [name] denies chest pain or shortness of breath. She is working weight loss to help control her blood pressure with the goal of decreasing her risk of heart attack and stroke. [name] systolic blood pressure is slightly elevated today but overall blood pressure has been well controlled since lolsartan dose increase.  At risk for cardiovascular disease [name] is at a higher than average risk for cardiovascular disease due to obesity. She currently denies any chest pain.   ASSESSMENT AND PLAN:  Type 2 diabetes mellitus without complication, without long-term current use of insulin ([place]) - Plan: glucose blood (ONE TOUCH ULTRA TEST) test strip  Essential  hypertension  At risk for heart disease  Class 3 severe obesity with serious comorbidity and body mass index (BMI) of 50.0 to 59.9 in adult, unspecified obesity type ([place])  PLAN:  Diabetes II [name] has been given extensive diabetes education by myself today including ideal fasting and post-prandial blood glucose readings, individual ideal Hgb A1c goals and hypoglycemia prevention. We discussed the importance of good blood sugar control to decrease the likelihood of diabetic complications such as nephropathy, neuropathy, limb loss, blindness, coronary artery disease, and death. We discussed the importance of intensive lifestyle modification including diet, exercise and weight loss as the first line treatment for diabetes. A prescription for test strips for BID testing was ordered today. [name] agrees to continue her diabetes medications and will follow up with our clinic in 3 weeks.  Hypertension We discussed sodium restriction, working on healthy weight loss, and a regular exercise program as the means to achieve improved blood pressure control. We will continue to monitor her blood pressure as well as her progress with the above lifestyle modifications. She will continue her medications and will watch for signs of hypotension as she continues her lifestyle modifications. [name] agrees to continue losartan and metoprolol and follow up with our clinic in 3 weeks.  Cardiovascular risk counseling [name] was given extended (15 minutes) coronary artery disease prevention counseling today. She is 53 y.o. female and has risk factors for heart disease including obesity. We discussed intensive lifestyle modifications today with an emphasis on specific weight loss instructions and strategies. Pt was  also informed of the importance of increasing exercise and decreasing saturated fats to help prevent heart disease.   Obesity [name] is currently in the action stage of change. As such, her goal is to  continue with weight loss efforts She has agreed to follow the Category 4 plan [name] has been instructed to walk 30-45 minutes 3-4 times per week for weight loss and overall health benefits. We discussed the following Behavioral Modification Strategies today: planning for success  [name] has agreed to follow up with our clinic in 3 weeks. She was informed of the importance of frequent follow up visits to maximize her success with intensive lifestyle modifications for her multiple health conditions.  ALLERGIES: No Known Allergies  MEDICATIONS: Current Outpatient Medications on File Prior to Visit  Medication Sig Dispense Refill  . Ascorbic Acid (VITAMIN C PO) Take by mouth.    [name] atorvastatin (LIPITOR) 20 MG tablet Take 1 tablet (20 mg total) by mouth daily. 90 tablet 3  . blood glucose meter kit and supplies Dispense based on patient and insurance preference. Use up to twice daily as directed. (FOR ICD-10 E10.9, E11.9). 1 each 0  . [facility] ASPIRIN LOW STRENGTH 81 MG EC tablet TAKE 1 TABLET BY MOUTH EVERY DAY 90 tablet 1  . furosemide (LASIX) 40 MG tablet TAKE 1 TABLET BY MOUTH EVERY DAY 90 tablet 1  . ibuprofen (ADVIL,MOTRIN) 200 MG tablet Take 200 mg by mouth every 6 (six) hours as needed.    [name] levonorgestrel ([name]) 20 MCG/24HR IUD 1 each by Intrauterine route once.    [name] losartan (COZAAR) 100 MG tablet Take 1 tablet (100 mg total) by mouth daily. 30 tablet 0  . metFORMIN (GLUCOPHAGE) 1000 MG tablet TAKE 1 TABLET (1,000 MG TOTAL) BY MOUTH 2 (TWO) TIMES DAILY WITH A MEAL. 180 tablet 1  . metoprolol succinate (TOPROL-XL) 50 MG 24 hr tablet Take 1 tablet (50 mg total) by mouth daily. Take with or immediately following a meal. 30 tablet 0  . ONETOUCH DELICA LANCETS 07W MISC USE TO TEST BLOOD GLUCOSE UP TO 2 TIMES DAILY AS DIRECTED 100 each 0  . Polyethyl Glycol-Propyl Glycol (SYSTANE) 0.4-0.3 % SOLN Apply to eye.    . Pseudoephedrine-Acetaminophen (ALKA-SELTZER PLUS COLD/SINUS PO) Take by mouth.     . Vitamin D, Cholecalciferol, 50 MCG (2000 UT) CAPS Take 1 capsule by mouth daily.     Current Facility-Administered Medications on File Prior to Visit  Medication Dose Route Frequency Provider Last Rate Last Dose  . levonorgestrel ([name]) 20 MCG/24HR IUD   Intrauterine Once [name], [name], MD        PAST MEDICAL HISTORY: Past Medical History:  Diagnosis Date  . Allergy   . Back pain   . CHF (congestive heart failure) ([place])   . Diabetes ([place])   . Dyspnea   . Gallbladder problem   . GERD (gastroesophageal reflux disease)   . Hip pain   . Hyperlipemia   . Hypertension   . Knee pain   . Leg edema   . Obesity   . Sleep apnea   . Venous insufficiency     PAST SURGICAL HISTORY: Past Surgical History:  Procedure Laterality Date  . Birth [name] removed    . CHOLECYSTECTOMY    . INTRAUTERINE DEVICE INSERTION  12/31/2013   [name]    SOCIAL HISTORY: Social History   Tobacco Use  . Smoking status: Never Smoker  . Smokeless tobacco: Never Used  Substance Use Topics  . Alcohol  use: Yes    Alcohol/week: 0.0 standard drinks    Comment: Rare  . Drug use: No    FAMILY HISTORY: Family History  Problem Relation Age of Onset  . Hypertension Father   . Diabetes Father   . Obesity Father   . Cancer Paternal Grandfather        Stomach  . Cancer Maternal Uncle        Pancreatic  . Cancer Paternal Uncle        Kidney  . Hypertension Mother   . Anxiety disorder Mother     ROS: Review of Systems  Constitutional: Positive for weight loss.  Cardiovascular: Negative for chest pain.  Gastrointestinal: Negative for diarrhea, nausea and vomiting.  Endo/Heme/Allergies:       Negative for hypoglycemia     PHYSICAL EXAM: Blood pressure (!) 146/74, pulse (!) 59, temperature 97.9 F (36.6 C), temperature source Oral, height _0  (1.651 m), weight (!) 303 lb (137.4 kg), SpO2 96 %. Body mass index is 50.42 kg/m. Physical Exam Vitals signs reviewed.  Constitutional:        Appearance: Normal appearance. She is obese.  Cardiovascular:     Rate and Rhythm: Normal rate.     Pulses: Normal pulses.  Pulmonary:     Effort: Pulmonary effort is normal.  Musculoskeletal: Normal range of motion.  Skin:    General: Skin is warm and dry.  Neurological:     Mental Status: She is alert and oriented to person, place, and time.  Psychiatric:        Mood and Affect: Mood normal.        Behavior: Behavior normal.     RECENT LABS AND TESTS: BMET    Component Value Date/Time   NA 140 02/12/2018 1315   K 3.7 02/12/2018 1315   CL 99 02/12/2018 1315   CO2 25 02/12/2018 1315   GLUCOSE 91 02/12/2018 1315   GLUCOSE 115 (H) 04/10/2017 1656   BUN 15 02/12/2018 1315   CREATININE 0.56 (L) 02/12/2018 1315   CALCIUM 9.6 02/12/2018 1315   GFRNONAA 108 02/12/2018 1315   GFRAA 124 02/12/2018 1315   Lab Results  Component Value Date   HGBA1C 6.0 (H) 02/12/2018   HGBA1C 7.2 (H) 10/20/2017   HGBA1C 7.3 (H) 07/14/2017   HGBA1C 7.7 (H) 04/03/2017   HGBA1C 7.4 (H) 09/13/2016   Lab Results  Component Value Date   INSULIN 8.9 02/12/2018   INSULIN 9.1 10/20/2017   CBC    Component Value Date/Time   WBC 7.9 10/20/2017 0940   WBC 10.6 (H) 04/05/2017 0443   RBC 5.35 (H) 10/20/2017 0940   RBC 5.16 (H) 04/05/2017 0443   HGB 15.0 10/20/2017 0940   HCT 48.0 (H) 10/20/2017 0940   PLT 265 04/05/2017 0443   MCV 90 10/20/2017 0940   MCH 28.0 10/20/2017 0940   MCH 27.3 04/05/2017 0443   MCHC 31.3 (L) 10/20/2017 0940   MCHC 29.4 (L) 04/05/2017 0443   RDW 16.0 (H) 10/20/2017 0940   LYMPHSABS 1.2 10/20/2017 0940   MONOABS 0.7 04/05/2017 0443   EOSABS 0.1 10/20/2017 0940   BASOSABS 0.1 10/20/2017 0940   Iron/TIBC/Ferritin/ %Sat No results found for: IRON, TIBC, FERRITIN, IRONPCTSAT Lipid Panel     Component Value Date/Time   CHOL 127 10/20/2017 0940   TRIG 91 10/20/2017 0940   HDL 47 10/20/2017 0940   CHOLHDL 3 09/13/2016 0958   VLDL 21.8 09/13/2016 0958    LDLCALC 62 10/20/2017 0940  Hepatic Function Panel     Component Value Date/Time   PROT 7.5 02/12/2018 1315   ALBUMIN 4.5 02/12/2018 1315   AST 30 02/12/2018 1315   ALT 65 (H) 02/12/2018 1315   ALKPHOS 161 (H) 02/12/2018 1315   BILITOT 0.7 02/12/2018 1315      Component Value Date/Time   TSH 2.790 10/20/2017 0940      OBESITY BEHAVIORAL INTERVENTION VISIT  Today's visit was # 9   Starting weight: 378 lbs Starting date: 10/20/2017 Today's weight : 303 lbs Today's date: 03/17/2018 Total lbs lost to date: 50   ASK: We discussed the diagnosis of obesity with [name] today and Eleonore agreed to give [place] permission to discuss obesity behavioral modification therapy today.  ASSESS: Heide has the diagnosis of obesity and her BMI today is 50.42 [name] is in the action stage of change   ADVISE: [name] was educated on the multiple health risks of obesity as well as the benefit of weight loss to improve her health. She was advised of the need for long term treatment and the importance of lifestyle modifications to improve her current health and to decrease her risk of future health problems.  AGREE: Multiple dietary modification options and treatment options were discussed and  [name] agreed to follow the recommendations documented in the above note.  ARRANGE: [name] was educated on the importance of frequent visits to treat obesity as outlined per CMS and USPSTF guidelines and agreed to schedule her next follow up appointment today.  I, Tammy Wysor, am acting as Location manager for [employer], FNP-C.  I have reviewed the above documentation for accuracy and completeness, and I agree with the above.  - Jermon Chalfant, FNP-C.

## 2018-03-21 ENCOUNTER — Other Ambulatory Visit: Payer: Self-pay | Admitting: Family Medicine

## 2018-03-23 ENCOUNTER — Telehealth: Payer: Self-pay | Admitting: *Deleted

## 2018-03-23 NOTE — Telephone Encounter (Signed)
Left message to call [employer] to find out the specifics on the payment denial for her BIPAP machine. They're responsible for getting the approval. I also suggested to the patient since apparently [employer] did not get the PA to question them about who will be responsible to pay for the time not covered. It should not be her if they did not get the PA needed. She is to call me back if she needs further assistance with this issue.

## 2018-03-27 ENCOUNTER — Other Ambulatory Visit (INDEPENDENT_AMBULATORY_CARE_PROVIDER_SITE_OTHER): Payer: Self-pay | Admitting: Family Medicine

## 2018-03-27 DIAGNOSIS — I10 Essential (primary) hypertension: Secondary | ICD-10-CM

## 2018-03-28 ENCOUNTER — Other Ambulatory Visit (INDEPENDENT_AMBULATORY_CARE_PROVIDER_SITE_OTHER): Payer: Self-pay | Admitting: Family Medicine

## 2018-03-28 DIAGNOSIS — E119 Type 2 diabetes mellitus without complications: Secondary | ICD-10-CM

## 2018-03-28 DIAGNOSIS — I10 Essential (primary) hypertension: Secondary | ICD-10-CM

## 2018-03-30 MED ORDER — ONETOUCH DELICA LANCETS 33G MISC: 0 refills | Status: DC

## 2018-03-30 MED ORDER — METOPROLOL SUCCINATE ER 50 MG PO TB24: 50.0000 mg | ORAL_TABLET | Freq: Every day | ORAL | 0 refills | Status: DC

## 2018-04-03 ENCOUNTER — Encounter (INDEPENDENT_AMBULATORY_CARE_PROVIDER_SITE_OTHER): Payer: Self-pay | Admitting: Family Medicine

## 2018-04-07 ENCOUNTER — Ambulatory Visit (INDEPENDENT_AMBULATORY_CARE_PROVIDER_SITE_OTHER): Payer: Self-pay | Admitting: Family Medicine

## 2018-04-11 ENCOUNTER — Other Ambulatory Visit (INDEPENDENT_AMBULATORY_CARE_PROVIDER_SITE_OTHER): Payer: Self-pay | Admitting: Family Medicine

## 2018-04-11 DIAGNOSIS — I10 Essential (primary) hypertension: Secondary | ICD-10-CM

## 2018-04-20 ENCOUNTER — Encounter: Payer: Self-pay | Admitting: Family Medicine

## 2018-04-22 ENCOUNTER — Ambulatory Visit (INDEPENDENT_AMBULATORY_CARE_PROVIDER_SITE_OTHER): Payer: BC Managed Care – PPO | Admitting: Family Medicine

## 2018-04-22 ENCOUNTER — Other Ambulatory Visit: Payer: Self-pay

## 2018-04-22 ENCOUNTER — Encounter (INDEPENDENT_AMBULATORY_CARE_PROVIDER_SITE_OTHER): Payer: Self-pay | Admitting: Family Medicine

## 2018-04-22 VITALS — BP 144/76 | HR 61 | Temp 97.7°F | Ht 65.0 in | Wt 295.0 lb

## 2018-04-22 DIAGNOSIS — E119 Type 2 diabetes mellitus without complications: Secondary | ICD-10-CM

## 2018-04-22 DIAGNOSIS — Z9189 Other specified personal risk factors, not elsewhere classified: Secondary | ICD-10-CM | POA: Diagnosis not present

## 2018-04-22 DIAGNOSIS — I10 Essential (primary) hypertension: Secondary | ICD-10-CM | POA: Diagnosis not present

## 2018-04-22 DIAGNOSIS — Z6841 Body Mass Index (BMI) 40.0 and over, adult: Secondary | ICD-10-CM

## 2018-04-22 MED ORDER — METOPROLOL SUCCINATE ER 50 MG PO TB24: 50.0000 mg | ORAL_TABLET | Freq: Every day | ORAL | 0 refills | Status: DC

## 2018-04-22 NOTE — Progress Notes (Signed)
Office: [phone]  /  Fax: [phone]   HPI:   Chief Complaint: OBESITY [name] is here to discuss her progress with her obesity treatment plan. She is on the Category 4 plan and is following her eating plan approximately 75% of the time. She states she is exercising 0 minutes 0 times per week. [name] states she feels weight loss is too slow. She is interested in other meal plans. Her weight is 295 lb (133.8 kg) today and has had a weight loss of 8 pounds over a period of 5 weeks since her last visit. She has lost 83 lbs since starting treatment with [place].  Hypertension [name] is a 53 y.o. female with hypertension and is on metoprolol and Losartan. [name]'s blood pressure is slightly elevated today.  [name] denies chest pain or shortness of breath on exertion. She is working weight loss to help control her blood pressure with the goal of decreasing her risk of heart attack and stroke.   At risk for cardiovascular disease [name] is at a higher than average risk for cardiovascular disease due to obesity. She currently denies any chest pain.  Diabetes Mellitus [name] has a diagnosis of diabetes mellitus, well controlled on metformin. [name] states fasting blood sugars range between 95-120 and 2 hour postprandials range between 100-109. She denies any hypoglycemic episodes. Last A1c was 6.0 on 02/12/2018. She has been working on intensive lifestyle modifications including diet, exercise, and weight loss to help control her blood glucose levels. She denies polyphagia.  ASSESSMENT AND PLAN:  Essential hypertension - Plan: metoprolol succinate (TOPROL-XL) 50 MG 24 hr tablet  Type 2 diabetes mellitus without complication, without long-term current use of insulin ([facility])  At risk for heart disease  Class 3 severe obesity with serious comorbidity and body mass index (BMI) of 45.0 to 49.9 in adult, unspecified obesity type ([place])  PLAN:  Hypertension We  discussed sodium restriction, working on healthy weight loss, and a regular exercise program as the means to achieve improved blood pressure control. [name] agreed with this plan and agreed to follow up as directed. We will continue to monitor her blood pressure as well as her progress with the above lifestyle modifications. She was given a refill on her metoprolol 50 mg daily #30 with 0 refills and will watch for signs of hypotension as she continues her lifestyle modifications. She agrees to follow-up with our clinic in 3 weeks.  Cardiovascular risk counseling [name] was given extended (15 minutes) coronary artery disease prevention counseling today. She is 53 y.o. female and has risk factors for heart disease including obesity. We discussed intensive lifestyle modifications today with an emphasis on specific weight loss instructions and strategies. Pt was also informed of the importance of increasing exercise and decreasing saturated fats to help prevent heart disease.  Diabetes Mellitus [name] has been given extensive diabetes education by myself today including ideal fasting and post-prandial blood glucose readings, individual ideal HgA1c goals  and hypoglycemia prevention. We discussed the importance of good blood sugar control to decrease the likelihood of diabetic complications such as nephropathy, neuropathy, limb loss, blindness, coronary artery disease, and death. We discussed the importance of intensive lifestyle modification including diet, exercise and weight loss as the first line treatment for diabetes. [name] agrees to continue her metformin and will follow up at the agreed upon time.  Obesity [name] is currently in the action stage of change. As such, her goal is to continue with weight loss efforts. She has  agreed to follow the Category 4 plan. [name] was provided with a low Carb Plan and she will think about it. [name] has been instructed to walk 2-3 days a week for 30  minutes. We discussed the following Behavioral Modification Strategies today: work on meal planning and easy cooking plans and planning for success.  [name] has agreed to follow-up with our clinic in 3 weeks. She was informed of the importance of frequent follow-up visits to maximize her success with intensive lifestyle modifications for her multiple health conditions.  ALLERGIES: No Known Allergies  MEDICATIONS: Current Outpatient Medications on File Prior to Visit  Medication Sig Dispense Refill   Ascorbic Acid (VITAMIN C PO) Take by mouth.     atorvastatin (LIPITOR) 20 MG tablet Take 1 tablet (20 mg total) by mouth daily. 90 tablet 3   blood glucose meter kit and supplies Dispense based on patient and insurance preference. Use up to twice daily as directed. (FOR ICD-10 E10.9, E11.9). 1 each 0   [facility] ASPIRIN LOW STRENGTH 81 MG EC tablet TAKE 1 TABLET BY MOUTH EVERY DAY 90 tablet 1   furosemide (LASIX) 40 MG tablet TAKE 1 TABLET BY MOUTH EVERY DAY 90 tablet 1   glucose blood (ONE TOUCH ULTRA TEST) test strip USE TO TEST BLOOD GLUCOSE 2 TIMES DAILY AS DIRECTED 100 each 0   ibuprofen (ADVIL,MOTRIN) 200 MG tablet Take 200 mg by mouth every 6 (six) hours as needed.     levonorgestrel (MIRENA) 20 MCG/24HR IUD 1 each by Intrauterine route once.     losartan (COZAAR) 100 MG tablet TAKE 1 TABLET BY MOUTH EVERY DAY 30 tablet 0   metFORMIN (GLUCOPHAGE) 1000 MG tablet TAKE 1 TABLET (1,000 MG TOTAL) BY MOUTH 2 (TWO) TIMES DAILY WITH A MEAL. 180 tablet 1   ONETOUCH DELICA LANCETS 17E MISC USE TO TEST BLOOD GLUCOSE UP TO 2 TIMES DAILY AS DIRECTED 100 each 0   Polyethyl Glycol-Propyl Glycol (SYSTANE) 0.4-0.3 % SOLN Apply to eye.     Pseudoephedrine-Acetaminophen (ALKA-SELTZER PLUS COLD/SINUS PO) Take by mouth.     Vitamin D, Cholecalciferol, 50 MCG (2000 UT) CAPS Take 1 capsule by mouth daily.     Current Facility-Administered Medications on File Prior to Visit  Medication Dose Route  Frequency Provider Last Rate Last Dose   levonorgestrel (MIRENA) 20 MCG/24HR IUD   Intrauterine Once [name], [name], MD        PAST MEDICAL HISTORY: Past Medical History:  Diagnosis Date   Allergy    Back pain    CHF (congestive heart failure) ([facility])    Diabetes ([place])    Dyspnea    Gallbladder problem    GERD (gastroesophageal reflux disease)    Hip pain    Hyperlipemia    Hypertension    Knee pain    Leg edema    Obesity    Sleep apnea    Venous insufficiency     PAST SURGICAL HISTORY: Past Surgical History:  Procedure Laterality Date   Birth Mark removed     CHOLECYSTECTOMY     INTRAUTERINE DEVICE INSERTION  12/31/2013   Mirena    SOCIAL HISTORY: Social History   Tobacco Use   Smoking status: Never Smoker   Smokeless tobacco: Never Used  Substance Use Topics   Alcohol use: Yes    Alcohol/week: 0.0 standard drinks    Comment: Rare   Drug use: No    FAMILY HISTORY: Family History  Problem Relation Age of Onset   Hypertension  Father    Diabetes Father    Obesity Father    Cancer Paternal Grandfather        Stomach   Cancer Maternal Uncle        Pancreatic   Cancer Paternal Uncle        Kidney   Hypertension Mother    Anxiety disorder Mother    ROS: Review of Systems  Constitutional: Positive for weight loss.  Respiratory: Negative for shortness of breath.   Cardiovascular: Negative for chest pain.  Endo/Heme/Allergies:       Negative for polyphagia. Negative for hypoglycemia.   PHYSICAL EXAM: Blood pressure (!) 144/76, pulse 61, temperature 97.7 F (36.5 C), temperature source Oral, height '5\' 5"'  (1.651 m), weight 295 lb (133.8 kg), SpO2 98 %. Body mass index is 49.09 kg/m. Physical Exam Vitals signs reviewed.  Constitutional:      Appearance: Normal appearance. She is obese.  Cardiovascular:     Rate and Rhythm: Normal rate.     Pulses: Normal pulses.  Pulmonary:     Effort: Pulmonary effort is  normal.     Breath sounds: Normal breath sounds.  Musculoskeletal: Normal range of motion.  Skin:    General: Skin is warm and dry.  Neurological:     Mental Status: She is alert and oriented to person, place, and time.  Psychiatric:        Behavior: Behavior normal.   RECENT LABS AND TESTS: BMET    Component Value Date/Time   NA 140 02/12/2018 1315   K 3.7 02/12/2018 1315   CL 99 02/12/2018 1315   CO2 25 02/12/2018 1315   GLUCOSE 91 02/12/2018 1315   GLUCOSE 115 (H) 04/10/2017 1656   BUN 15 02/12/2018 1315   CREATININE 0.56 (L) 02/12/2018 1315   CALCIUM 9.6 02/12/2018 1315   GFRNONAA 108 02/12/2018 1315   GFRAA 124 02/12/2018 1315   Lab Results  Component Value Date   HGBA1C 6.0 (H) 02/12/2018   HGBA1C 7.2 (H) 10/20/2017   HGBA1C 7.3 (H) 07/14/2017   HGBA1C 7.7 (H) 04/03/2017   HGBA1C 7.4 (H) 09/13/2016   Lab Results  Component Value Date   INSULIN 8.9 02/12/2018   INSULIN 9.1 10/20/2017   CBC    Component Value Date/Time   WBC 7.9 10/20/2017 0940   WBC 10.6 (H) 04/05/2017 0443   RBC 5.35 (H) 10/20/2017 0940   RBC 5.16 (H) 04/05/2017 0443   HGB 15.0 10/20/2017 0940   HCT 48.0 (H) 10/20/2017 0940   PLT 265 04/05/2017 0443   MCV 90 10/20/2017 0940   MCH 28.0 10/20/2017 0940   MCH 27.3 04/05/2017 0443   MCHC 31.3 (L) 10/20/2017 0940   MCHC 29.4 (L) 04/05/2017 0443   RDW 16.0 (H) 10/20/2017 0940   LYMPHSABS 1.2 10/20/2017 0940   MONOABS 0.7 04/05/2017 0443   EOSABS 0.1 10/20/2017 0940   BASOSABS 0.1 10/20/2017 0940   Iron/TIBC/Ferritin/ %Sat No results found for: IRON, TIBC, FERRITIN, IRONPCTSAT Lipid Panel     Component Value Date/Time   CHOL 127 10/20/2017 0940   TRIG 91 10/20/2017 0940   HDL 47 10/20/2017 0940   CHOLHDL 3 09/13/2016 0958   VLDL 21.8 09/13/2016 0958   LDLCALC 62 10/20/2017 0940   Hepatic Function Panel     Component Value Date/Time   PROT 7.5 02/12/2018 1315   ALBUMIN 4.5 02/12/2018 1315   AST 30 02/12/2018 1315   ALT 65  (H) 02/12/2018 1315   ALKPHOS 161 (H) 02/12/2018 1315  BILITOT 0.7 02/12/2018 1315      Component Value Date/Time   TSH 2.790 10/20/2017 0940   Results for SHANNEL, ZAHM (MRN [id number]) as of 04/22/2018 17:04  Ref. Range 02/12/2018 13:15  Vitamin D, 25-Hydroxy Latest Ref Range: 30.0 - 100.0 ng/mL 52.4   OBESITY BEHAVIORAL INTERVENTION VISIT  Today's visit was #10  Starting weight: 378 lbs Starting date: 10/20/2017 Today's weight: 295 lbs Today's date: 04/22/2018 Total lbs lost to date: 83    04/22/2018  Height '5\' 5"'  (1.651 m)  Weight 295 lb (133.8 kg)  BMI (Calculated) 49.09  BLOOD PRESSURE - SYSTOLIC 462  BLOOD PRESSURE - DIASTOLIC 76   Body Fat % 70.3 %   ASK: We discussed the diagnosis of obesity with [name] today and [name] agreed to give [place] permission to discuss obesity behavioral modification therapy today.  ASSESS: [name] has the diagnosis of obesity and her BMI today is 49.09. [name] is in the action stage of change.   ADVISE: [name] was educated on the multiple health risks of obesity as well as the benefit of weight loss to improve her health. She was advised of the need for long term treatment and the importance of lifestyle modifications to improve her current health and to decrease her risk of future health problems.  AGREE: Multiple dietary modification options and treatment options were discussed and  [name] agreed to follow the recommendations documented in the above note.  ARRANGE: [name] was educated on the importance of frequent visits to treat obesity as outlined per CMS and USPSTF guidelines and agreed to schedule her next follow up appointment today.  IMichaelene Song, am acting as Location manager for [employer], FNP-C.  I have reviewed the above documentation for accuracy and completeness, and I agree with the above.  - Wanda Wadley, FNP-C.

## 2018-05-06 ENCOUNTER — Encounter (INDEPENDENT_AMBULATORY_CARE_PROVIDER_SITE_OTHER): Payer: Self-pay

## 2018-05-12 ENCOUNTER — Encounter (INDEPENDENT_AMBULATORY_CARE_PROVIDER_SITE_OTHER): Payer: Self-pay | Admitting: Family Medicine

## 2018-05-12 ENCOUNTER — Other Ambulatory Visit: Payer: Self-pay

## 2018-05-12 ENCOUNTER — Ambulatory Visit (INDEPENDENT_AMBULATORY_CARE_PROVIDER_SITE_OTHER): Payer: BC Managed Care – PPO | Admitting: Family Medicine

## 2018-05-12 DIAGNOSIS — E119 Type 2 diabetes mellitus without complications: Secondary | ICD-10-CM | POA: Diagnosis not present

## 2018-05-12 DIAGNOSIS — Z6841 Body Mass Index (BMI) 40.0 and over, adult: Secondary | ICD-10-CM | POA: Diagnosis not present

## 2018-05-12 DIAGNOSIS — I10 Essential (primary) hypertension: Secondary | ICD-10-CM | POA: Diagnosis not present

## 2018-05-12 DIAGNOSIS — E66813 Obesity, class 3: Secondary | ICD-10-CM

## 2018-05-12 MED ORDER — METOPROLOL SUCCINATE ER 50 MG PO TB24: 50.0000 mg | ORAL_TABLET | Freq: Every day | ORAL | 0 refills | Status: DC

## 2018-05-12 MED ORDER — GLUCOSE BLOOD VI STRP: ORAL_STRIP | 0 refills | Status: DC

## 2018-05-12 MED ORDER — ONETOUCH DELICA LANCETS 33G MISC: 0 refills | Status: DC

## 2018-05-13 ENCOUNTER — Encounter (INDEPENDENT_AMBULATORY_CARE_PROVIDER_SITE_OTHER): Payer: Self-pay | Admitting: Family Medicine

## 2018-05-13 ENCOUNTER — Encounter: Payer: Self-pay | Admitting: Family Medicine

## 2018-05-13 NOTE — Progress Notes (Signed)
Office: [phone]  /  Fax: [phone] TeleHealth Visit:  [name] has consented to this TeleHealth visit today via face time. The patient is located at home, the provider is located at the [facility] office. The participants in this visit include the listed provider and patient.  HPI:   Chief Complaint: OBESITY [name] is here to discuss her progress with her obesity treatment plan. She is on the Category 4 plan and is following her eating plan approximately 60 % of the time. She states she is walking for 30-40 minutes 4-5 times per week. [name] weighed 295 lbs at home today. She thinks she lost 2 lbs over the past few weeks. She had lost 5 lbs before that and gained 3 lbs back. She is struggling with the plan due to increased stress (COVID-19), working at home, and food availability.  We were unable to weigh the patient today for this TeleHealth visit.She feels as if she has lost 2 lbs since her last visit. She has lost 83-85 lbs since starting treatment with [place].  Diabetes II [name] has a diagnosis of diabetes type II. [name] states fasting BGs range between 100 and 110, well controlled on metformin. She denies hypoglycemia. Last A1c was 6.0. She has been working on intensive lifestyle modifications including diet, exercise, and weight loss to help control her blood glucose levels. Lab Results  Component Value Date   HGBA1C 6.0 (H) 02/12/2018    Hypertension [name] is a 53 y.o. female with hypertension. HTN is well controlled. [name] states her blood pressure this morning was 130/70. She denies chest pain or shortness of breath. She is working on weight loss to help control her blood pressure with the goal of decreasing her risk of heart attack and stroke.   ASSESSMENT AND PLAN:  Type 2 diabetes mellitus without complication, without long-term current use of insulin ([place]) - Plan: glucose blood (ONE TOUCH ULTRA TEST) test strip, OneTouch  Delica Lancets 28U MISC  Essential hypertension - Plan: metoprolol succinate (TOPROL-XL) 50 MG 24 hr tablet  Class 3 severe obesity with serious comorbidity and body mass index (BMI) of 45.0 to 49.9 in adult, unspecified obesity type ([place])  PLAN:  Diabetes II [name] has been given extensive diabetes education by myself today including ideal fasting and post-prandial blood glucose readings, individual ideal Hgb A1c goals and hypoglycemia prevention. We discussed the importance of good blood sugar control to decrease the likelihood of diabetic complications such as nephropathy, neuropathy, limb loss, blindness, coronary artery disease, and death. We discussed the importance of intensive lifestyle modification including diet, exercise and weight loss as the first line treatment for diabetes. [name] agrees to continue her diabetes medications and we will refill test strips and lancets for testing twice daily. [name] agrees to follow up with our clinic in 2 to 3 weeks.  Hypertension We discussed sodium restriction, working on healthy weight loss, and a regular exercise program as the means to achieve improved blood pressure control. [name] agreed with this plan and agreed to follow up as directed. We will continue to monitor her blood pressure as well as her progress with the above lifestyle modifications. [name] agrees to continue taking metoprolol 50 mg qd #30 and we will refill for 1 month. She will watch for signs of hypotension as she continues her lifestyle modifications. [name] agrees to follow up with our clinic in 2 to 3 weeks.  Obesity [name] is currently in the action stage of change. As  such, her goal is to continue with weight loss efforts She has agreed to keep a food journal with 1800 calories and 110-115 grams of protein daily or follow the Category 4 plan [name] will continue current exercise regimen for weight loss and overall health benefits. We discussed the following  Behavioral Modification Strategies today: increasing lean protein intake, decreasing simple carbohydrates, planning for success, and keep a strict food journal    [name] has agreed to follow up with our clinic in 2 to 3 weeks. She was informed of the importance of frequent follow up visits to maximize her success with intensive lifestyle modifications for her multiple health conditions.  ALLERGIES: No Known Allergies  MEDICATIONS: Current Outpatient Medications on File Prior to Visit  Medication Sig Dispense Refill  . Ascorbic Acid (VITAMIN C PO) Take by mouth.    [name] atorvastatin (LIPITOR) 20 MG tablet Take 1 tablet (20 mg total) by mouth daily. 90 tablet 3  . blood glucose meter kit and supplies Dispense based on patient and insurance preference. Use up to twice daily as directed. (FOR ICD-10 E10.9, E11.9). 1 each 0  . [facility] ASPIRIN LOW STRENGTH 81 MG EC tablet TAKE 1 TABLET BY MOUTH EVERY DAY 90 tablet 1  . furosemide (LASIX) 40 MG tablet TAKE 1 TABLET BY MOUTH EVERY DAY 90 tablet 1  . ibuprofen (ADVIL,MOTRIN) 200 MG tablet Take 200 mg by mouth every 6 (six) hours as needed.    [name] levonorgestrel (MIRENA) 20 MCG/24HR IUD 1 each by Intrauterine route once.    [name] losartan (COZAAR) 100 MG tablet TAKE 1 TABLET BY MOUTH EVERY DAY 30 tablet 0  . metFORMIN (GLUCOPHAGE) 1000 MG tablet TAKE 1 TABLET (1,000 MG TOTAL) BY MOUTH 2 (TWO) TIMES DAILY WITH A MEAL. 180 tablet 1  . Polyethyl Glycol-Propyl Glycol (SYSTANE) 0.4-0.3 % SOLN Apply to eye.    . Pseudoephedrine-Acetaminophen (ALKA-SELTZER PLUS COLD/SINUS PO) Take by mouth.    . Vitamin D, Cholecalciferol, 50 MCG (2000 UT) CAPS Take 1 capsule by mouth daily.     Current Facility-Administered Medications on File Prior to Visit  Medication Dose Route Frequency Provider Last Rate Last Dose  . levonorgestrel (MIRENA) 20 MCG/24HR IUD   Intrauterine Once [name], [name], MD        PAST MEDICAL HISTORY: Past Medical History:  Diagnosis Date  .  Allergy   . Back pain   . CHF (congestive heart failure) ([place])   . Diabetes ([place])   . Dyspnea   . Gallbladder problem   . GERD (gastroesophageal reflux disease)   . Hip pain   . Hyperlipemia   . Hypertension   . Knee pain   . Leg edema   . Obesity   . Sleep apnea   . Venous insufficiency     PAST SURGICAL HISTORY: Past Surgical History:  Procedure Laterality Date  . Birth [place] removed    . CHOLECYSTECTOMY    . INTRAUTERINE DEVICE INSERTION  12/31/2013   Mirena    SOCIAL HISTORY: Social History   Tobacco Use  . Smoking status: Never Smoker  . Smokeless tobacco: Never Used  Substance Use Topics  . Alcohol use: Yes    Alcohol/week: 0.0 standard drinks    Comment: Rare  . Drug use: No    FAMILY HISTORY: Family History  Problem Relation Age of Onset  . Hypertension Father   . Diabetes Father   . Obesity Father   . Cancer Paternal Grandfather  Stomach  . Cancer Maternal Uncle        Pancreatic  . Cancer Paternal Uncle        Kidney  . Hypertension Mother   . Anxiety disorder Mother     ROS: Review of Systems  Constitutional: Positive for weight loss.  Respiratory: Negative for shortness of breath.   Cardiovascular: Negative for chest pain.  Endo/Heme/Allergies:       Negative hypoglycemia    PHYSICAL EXAM: Pt in no acute distress  RECENT LABS AND TESTS: BMET    Component Value Date/Time   NA 140 02/12/2018 1315   K 3.7 02/12/2018 1315   CL 99 02/12/2018 1315   CO2 25 02/12/2018 1315   GLUCOSE 91 02/12/2018 1315   GLUCOSE 115 (H) 04/10/2017 1656   BUN 15 02/12/2018 1315   CREATININE 0.56 (L) 02/12/2018 1315   CALCIUM 9.6 02/12/2018 1315   GFRNONAA 108 02/12/2018 1315   GFRAA 124 02/12/2018 1315   Lab Results  Component Value Date   HGBA1C 6.0 (H) 02/12/2018   HGBA1C 7.2 (H) 10/20/2017   HGBA1C 7.3 (H) 07/14/2017   HGBA1C 7.7 (H) 04/03/2017   HGBA1C 7.4 (H) 09/13/2016   Lab Results  Component Value Date   INSULIN 8.9  02/12/2018   INSULIN 9.1 10/20/2017   CBC    Component Value Date/Time   WBC 7.9 10/20/2017 0940   WBC 10.6 (H) 04/05/2017 0443   RBC 5.35 (H) 10/20/2017 0940   RBC 5.16 (H) 04/05/2017 0443   HGB 15.0 10/20/2017 0940   HCT 48.0 (H) 10/20/2017 0940   PLT 265 04/05/2017 0443   MCV 90 10/20/2017 0940   MCH 28.0 10/20/2017 0940   MCH 27.3 04/05/2017 0443   MCHC 31.3 (L) 10/20/2017 0940   MCHC 29.4 (L) 04/05/2017 0443   RDW 16.0 (H) 10/20/2017 0940   LYMPHSABS 1.2 10/20/2017 0940   MONOABS 0.7 04/05/2017 0443   EOSABS 0.1 10/20/2017 0940   BASOSABS 0.1 10/20/2017 0940   Iron/TIBC/Ferritin/ %Sat No results found for: IRON, TIBC, FERRITIN, IRONPCTSAT Lipid Panel     Component Value Date/Time   CHOL 127 10/20/2017 0940   TRIG 91 10/20/2017 0940   HDL 47 10/20/2017 0940   CHOLHDL 3 09/13/2016 0958   VLDL 21.8 09/13/2016 0958   LDLCALC 62 10/20/2017 0940   Hepatic Function Panel     Component Value Date/Time   PROT 7.5 02/12/2018 1315   ALBUMIN 4.5 02/12/2018 1315   AST 30 02/12/2018 1315   ALT 65 (H) 02/12/2018 1315   ALKPHOS 161 (H) 02/12/2018 1315   BILITOT 0.7 02/12/2018 1315      Component Value Date/Time   TSH 2.790 10/20/2017 0940      I, Trixie Dredge, am acting as Location manager for [employer], FNP-C.  I have reviewed the above documentation for accuracy and completeness, and I agree with the above.  - Christan Defranco, FNP-C.

## 2018-05-20 ENCOUNTER — Other Ambulatory Visit: Payer: Self-pay | Admitting: Family Medicine

## 2018-05-23 ENCOUNTER — Other Ambulatory Visit: Payer: Self-pay | Admitting: Family Medicine

## 2018-05-24 ENCOUNTER — Other Ambulatory Visit (INDEPENDENT_AMBULATORY_CARE_PROVIDER_SITE_OTHER): Payer: Self-pay | Admitting: Family Medicine

## 2018-05-24 DIAGNOSIS — I10 Essential (primary) hypertension: Secondary | ICD-10-CM

## 2018-05-26 MED ORDER — LOSARTAN POTASSIUM 100 MG PO TABS: 100.0000 mg | ORAL_TABLET | Freq: Every day | ORAL | 0 refills | Status: DC

## 2018-06-01 ENCOUNTER — Encounter (INDEPENDENT_AMBULATORY_CARE_PROVIDER_SITE_OTHER): Payer: Self-pay | Admitting: Family Medicine

## 2018-06-01 ENCOUNTER — Other Ambulatory Visit: Payer: Self-pay

## 2018-06-01 ENCOUNTER — Ambulatory Visit (INDEPENDENT_AMBULATORY_CARE_PROVIDER_SITE_OTHER): Payer: BC Managed Care – PPO | Admitting: Family Medicine

## 2018-06-01 DIAGNOSIS — E119 Type 2 diabetes mellitus without complications: Secondary | ICD-10-CM | POA: Diagnosis not present

## 2018-06-01 DIAGNOSIS — I10 Essential (primary) hypertension: Secondary | ICD-10-CM

## 2018-06-01 DIAGNOSIS — Z6841 Body Mass Index (BMI) 40.0 and over, adult: Secondary | ICD-10-CM | POA: Diagnosis not present

## 2018-06-01 MED ORDER — METOPROLOL SUCCINATE ER 50 MG PO TB24: 50.0000 mg | ORAL_TABLET | Freq: Every day | ORAL | 0 refills | Status: DC

## 2018-06-02 ENCOUNTER — Encounter (INDEPENDENT_AMBULATORY_CARE_PROVIDER_SITE_OTHER): Payer: Self-pay | Admitting: Family Medicine

## 2018-06-02 NOTE — Progress Notes (Signed)
Office: [phone]  /  Fax: [phone] TeleHealth Visit:  [name] has verbally consented to this TeleHealth visit today. The patient is located at work, the provider is located at the [employer] [facility] office. The participants in this visit include the listed provider and patient. The visit was conducted today via FaceTime.  HPI:   Chief Complaint: OBESITY [name] is here to discuss her progress with her obesity treatment plan. She is on the Category 4 plan and is following her eating plan approximately 70% of the time. She states she is walking 30-40 minutes 4-5 times per week. [name] states she had gained 5 lbs but has lost 4 lbs of it.  She states she is eating more because she has been out of her routine. She states she weighed 296 lbs today. She reports being back on the plan. We were unable to weigh the patient today for this TeleHealth visit. She feels as if she has gained 1 lb since her last visit. She has lost 83 lbs since starting treatment with [place].  Hypertension [name] is a 53 y.o. female with hypertension.  [name] denies chest pain or shortness of breath on exertion. She is working weight loss to help control her blood pressure with the goal of decreasing her risk of heart attack and stroke. [name] blood pressure is 124/65 and is well controlled at this time.  Diabetes Mellitus [name] has a diagnosis of diabetes mellitus, well controlled on metformin. [name] states CBG's are running 100-110. Last A1c was 6.0 on 02/12/2018. She has been working on intensive lifestyle modifications including diet, exercise, and weight loss to help control her blood glucose levels.  ASSESSMENT AND PLAN:  Essential hypertension - Plan: metoprolol succinate (TOPROL-XL) 50 MG 24 hr tablet  Type 2 diabetes mellitus without complication, without long-term current use of insulin ([facility])  Class 3 severe obesity with serious comorbidity and body  mass index (BMI) of 45.0 to 49.9 in adult, unspecified obesity type ([name])  PLAN:  Hypertension We discussed sodium restriction, working on healthy weight loss, and a regular exercise program as the means to achieve improved blood pressure control. [name] agreed with this plan and agreed to follow-up as directed. We will continue to monitor her blood pressure as well as her progress with the above lifestyle modifications. She was given a refill on her metoprolol 50 mg QD #30 with 0 refills and agrees to follow-up with our clinic in 3 weeks. She will watch for signs of hypotension as she continues her lifestyle modifications.  Diabetes Mellitus [name] has been given extensive diabetes education by myself today including ideal fasting and post-prandial blood glucose readings, individual ideal HgA1c goals  and hypoglycemia prevention. We discussed the importance of good blood sugar control to decrease the likelihood of diabetic complications such as nephropathy, neuropathy, limb loss, blindness, coronary artery disease, and death. We discussed the importance of intensive lifestyle modification including diet, exercise and weight loss as the first line treatment for diabetes. [name] agrees to continue her metformin and will follow-up at the agreed upon time.  Obesity [name] is currently in the action stage of change. As such, her goal is to continue with weight loss efforts. She has agreed to follow the Category 4 plan. [name] has been instructed to continue her current exercise regimen for weight loss and overall health benefits. We discussed the following Behavioral Modification Strategies today: planning for success.  [name] has agreed to follow-up with our clinic in  3 weeks. She was informed of the importance of frequent follow-up visits to maximize her success with intensive lifestyle modifications for her multiple health conditions.  ALLERGIES: No Known Allergies  MEDICATIONS: Current  Outpatient Medications on File Prior to Visit  Medication Sig Dispense Refill   Ascorbic Acid (VITAMIN C PO) Take by mouth.     atorvastatin (LIPITOR) 20 MG tablet Take 1 tablet (20 mg total) by mouth daily. 90 tablet 3   blood glucose meter kit and supplies Dispense based on patient and insurance preference. Use up to twice daily as directed. (FOR ICD-10 E10.9, E11.9). 1 each 0   [facility] ASPIRIN LOW STRENGTH 81 MG EC tablet TAKE 1 TABLET BY MOUTH EVERY DAY 90 tablet 1   furosemide (LASIX) 40 MG tablet TAKE 1 TABLET BY MOUTH EVERY DAY 90 tablet 1   glucose blood (ONE TOUCH ULTRA TEST) test strip USE TO TEST BLOOD GLUCOSE 2 TIMES DAILY AS DIRECTED 100 each 0   ibuprofen (ADVIL,MOTRIN) 200 MG tablet Take 200 mg by mouth every 6 (six) hours as needed.     levonorgestrel (MIRENA) 20 MCG/24HR IUD 1 each by Intrauterine route once.     losartan (COZAAR) 100 MG tablet Take 1 tablet (100 mg total) by mouth daily. 30 tablet 0   metFORMIN (GLUCOPHAGE) 1000 MG tablet TAKE 1 TABLET (1,000 MG TOTAL) BY MOUTH 2 (TWO) TIMES DAILY WITH A MEAL. 180 tablet 1   OneTouch Delica Lancets 49P MISC USE TO TEST BLOOD GLUCOSE UP TO 2 TIMES DAILY AS DIRECTED 100 each 0   Polyethyl Glycol-Propyl Glycol (SYSTANE) 0.4-0.3 % SOLN Apply to eye.     Pseudoephedrine-Acetaminophen (ALKA-SELTZER PLUS COLD/SINUS PO) Take by mouth.     Vitamin D, Cholecalciferol, 50 MCG (2000 UT) CAPS Take 1 capsule by mouth daily.     Current Facility-Administered Medications on File Prior to Visit  Medication Dose Route Frequency Provider Last Rate Last Dose   levonorgestrel (MIRENA) 20 MCG/24HR IUD   Intrauterine Once [name], [name], MD        PAST MEDICAL HISTORY: Past Medical History:  Diagnosis Date   Allergy    Back pain    CHF (congestive heart failure) ([facility])    Diabetes ([place])    Dyspnea    Gallbladder problem    GERD (gastroesophageal reflux disease)    Hip pain    Hyperlipemia    Hypertension     Knee pain    Leg edema    Obesity    Sleep apnea    Venous insufficiency     PAST SURGICAL HISTORY: Past Surgical History:  Procedure Laterality Date   Birth Mark removed     CHOLECYSTECTOMY     INTRAUTERINE DEVICE INSERTION  12/31/2013   Mirena    SOCIAL HISTORY: Social History   Tobacco Use   Smoking status: Never Smoker   Smokeless tobacco: Never Used  Substance Use Topics   Alcohol use: Yes    Alcohol/week: 0.0 standard drinks    Comment: Rare   Drug use: No    FAMILY HISTORY: Family History  Problem Relation Age of Onset   Hypertension Father    Diabetes Father    Obesity Father    Cancer Paternal Grandfather        Stomach   Cancer Maternal Uncle        Pancreatic   Cancer Paternal Uncle        Kidney   Hypertension Mother    Anxiety disorder Mother  ROS: Review of Systems  Respiratory: Negative for shortness of breath.   Cardiovascular: Negative for chest pain.   PHYSICAL EXAM: Pt in no acute distress  RECENT LABS AND TESTS: BMET    Component Value Date/Time   NA 140 02/12/2018 1315   K 3.7 02/12/2018 1315   CL 99 02/12/2018 1315   CO2 25 02/12/2018 1315   GLUCOSE 91 02/12/2018 1315   GLUCOSE 115 (H) 04/10/2017 1656   BUN 15 02/12/2018 1315   CREATININE 0.56 (L) 02/12/2018 1315   CALCIUM 9.6 02/12/2018 1315   GFRNONAA 108 02/12/2018 1315   GFRAA 124 02/12/2018 1315   Lab Results  Component Value Date   HGBA1C 6.0 (H) 02/12/2018   HGBA1C 7.2 (H) 10/20/2017   HGBA1C 7.3 (H) 07/14/2017   HGBA1C 7.7 (H) 04/03/2017   HGBA1C 7.4 (H) 09/13/2016   Lab Results  Component Value Date   INSULIN 8.9 02/12/2018   INSULIN 9.1 10/20/2017   CBC    Component Value Date/Time   WBC 7.9 10/20/2017 0940   WBC 10.6 (H) 04/05/2017 0443   RBC 5.35 (H) 10/20/2017 0940   RBC 5.16 (H) 04/05/2017 0443   HGB 15.0 10/20/2017 0940   HCT 48.0 (H) 10/20/2017 0940   PLT 265 04/05/2017 0443   MCV 90 10/20/2017 0940   MCH 28.0  10/20/2017 0940   MCH 27.3 04/05/2017 0443   MCHC 31.3 (L) 10/20/2017 0940   MCHC 29.4 (L) 04/05/2017 0443   RDW 16.0 (H) 10/20/2017 0940   LYMPHSABS 1.2 10/20/2017 0940   MONOABS 0.7 04/05/2017 0443   EOSABS 0.1 10/20/2017 0940   BASOSABS 0.1 10/20/2017 0940   Iron/TIBC/Ferritin/ %Sat No results found for: IRON, TIBC, FERRITIN, IRONPCTSAT Lipid Panel     Component Value Date/Time   CHOL 127 10/20/2017 0940   TRIG 91 10/20/2017 0940   HDL 47 10/20/2017 0940   CHOLHDL 3 09/13/2016 0958   VLDL 21.8 09/13/2016 0958   LDLCALC 62 10/20/2017 0940   Hepatic Function Panel     Component Value Date/Time   PROT 7.5 02/12/2018 1315   ALBUMIN 4.5 02/12/2018 1315   AST 30 02/12/2018 1315   ALT 65 (H) 02/12/2018 1315   ALKPHOS 161 (H) 02/12/2018 1315   BILITOT 0.7 02/12/2018 1315      Component Value Date/Time   TSH 2.790 10/20/2017 0940   Results for ADRIJANA, HAROS (MRN [id number]) as of 06/02/2018 08:22  Ref. Range 02/12/2018 13:15  Vitamin D, 25-Hydroxy Latest Ref Range: 30.0 - 100.0 ng/mL 52.4   I, Michaelene Song, am acting as Location manager for [employer], FNP-C.  I have reviewed the above documentation for accuracy and completeness, and I agree with the above.  - Dawn Whitmire, FNP-C.

## 2018-06-22 ENCOUNTER — Ambulatory Visit (INDEPENDENT_AMBULATORY_CARE_PROVIDER_SITE_OTHER): Payer: BC Managed Care – PPO | Admitting: Family Medicine

## 2018-06-22 ENCOUNTER — Other Ambulatory Visit: Payer: Self-pay

## 2018-06-22 ENCOUNTER — Encounter (INDEPENDENT_AMBULATORY_CARE_PROVIDER_SITE_OTHER): Payer: Self-pay | Admitting: Family Medicine

## 2018-06-22 DIAGNOSIS — Z6841 Body Mass Index (BMI) 40.0 and over, adult: Secondary | ICD-10-CM | POA: Diagnosis not present

## 2018-06-22 DIAGNOSIS — E119 Type 2 diabetes mellitus without complications: Secondary | ICD-10-CM | POA: Diagnosis not present

## 2018-06-22 DIAGNOSIS — I10 Essential (primary) hypertension: Secondary | ICD-10-CM | POA: Diagnosis not present

## 2018-06-22 MED ORDER — LOSARTAN POTASSIUM 100 MG PO TABS: 100.0000 mg | ORAL_TABLET | Freq: Every day | ORAL | 0 refills | Status: DC

## 2018-06-22 MED ORDER — METOPROLOL SUCCINATE ER 50 MG PO TB24: 50.0000 mg | ORAL_TABLET | Freq: Every day | ORAL | 0 refills | Status: DC

## 2018-06-22 NOTE — Progress Notes (Signed)
Office: [phone]  /  Fax: [phone] TeleHealth Visit:  [name] has verbally consented to this TeleHealth visit today. The patient is located at work in her car, the provider is located at the [employer] [facility] office. The participants in this visit include the listed provider and patient. The visit was conducted today via FaceTime.  HPI:   Chief Complaint: OBESITY [name] is here to discuss her progress with her obesity treatment plan. She is on the Category 4 plan and is following her eating plan approximately 80% of the time. She states she is walking 35 minutes 4 times per week. [name] states she weighed 293 lbs today, reflecting a 3 lb weight loss. She likes the structure of the Category 4 plan.  We were unable to weigh the patient today for this TeleHealth visit. She feels as if she has lost 2 lbs since her last visit. She has lost 83 lbs since starting treatment with [place].  Hypertension [name] is a 53 y.o. female with hypertension.  [name] denies chest pain or shortness of breath on exertion. She is working weight loss to help control her blood pressure with the goal of decreasing her risk of heart attack and stroke. [name]'s blood pressure is currently well controlled with recent home blood pressures of  136/69 and 123/70.  Diabetes Mellitus [name] has a diagnosis of diabetes mellitus, well controlled on metformin only. [name] states CBG's (fasting and 2 hour postprandial) are in the range of 100 to 110. We discussed Rybelsus but she declined. She denies polyphagia. Last A1c was 6.0 on 02/12/2018. She has been working on intensive lifestyle modifications including diet, exercise, and weight loss to help control her blood glucose levels.  ASSESSMENT AND PLAN:  Essential hypertension - Plan: losartan (COZAAR) 100 MG tablet, metoprolol succinate (TOPROL-XL) 50 MG 24 hr tablet  Type 2 diabetes mellitus without complication,  without long-term current use of insulin (HCC)  Class 3 severe obesity with serious comorbidity and body mass index (BMI) of 45.0 to 49.9 in adult, unspecified obesity type ([place])  PLAN:  Hypertension We discussed sodium restriction, working on healthy weight loss, and a regular exercise program as the means to achieve improved blood pressure control. [name] agreed with this plan and agreed to follow up as directed. We will continue to monitor her blood pressure as well as her progress with the above lifestyle modifications. [name] was given a refill on her Losartan 100 mg QD #30 with 0 refills and metoprolol 50 mg QD #30 with 0 refills. She agrees to follow-up with our clinic in 2 weeks and will watch for signs of hypotension as she continues her lifestyle modifications.  Diabetes Mellitus [name] has been given extensive diabetes education by myself today including ideal fasting and post-prandial blood glucose readings, individual ideal HgA1c goals  and hypoglycemia prevention. We discussed the importance of good blood sugar control to decrease the likelihood of diabetic complications such as nephropathy, neuropathy, limb loss, blindness, coronary artery disease, and death. We discussed the importance of intensive lifestyle modification including diet, exercise and weight loss as the first line treatment for diabetes. [name] agrees to continue metformin and will follow-up at the agreed upon time.  Obesity [name] is currently in the action stage of change. As such, her goal is to continue with weight loss efforts. She has agreed to follow the Category 4 plan. [name] has been instructed to continue her current exercise regimen for weight loss and overall health  benefits. We discussed the following Behavioral Modification Strategies today: planning for success.  [name] has agreed to follow-up with our clinic in 2 weeks. She was informed of the importance of frequent follow-up visits to  maximize her success with intensive lifestyle modifications for her multiple health conditions.  ALLERGIES: No Known Allergies  MEDICATIONS: Current Outpatient Medications on File Prior to Visit  Medication Sig Dispense Refill  . Ascorbic Acid (VITAMIN C PO) Take by mouth.    [name] atorvastatin (LIPITOR) 20 MG tablet Take 1 tablet (20 mg total) by mouth daily. 90 tablet 3  . blood glucose meter kit and supplies Dispense based on patient and insurance preference. Use up to twice daily as directed. (FOR ICD-10 E10.9, E11.9). 1 each 0  . [facility] ASPIRIN LOW STRENGTH 81 MG EC tablet TAKE 1 TABLET BY MOUTH EVERY DAY 90 tablet 1  . furosemide (LASIX) 40 MG tablet TAKE 1 TABLET BY MOUTH EVERY DAY 90 tablet 1  . glucose blood (ONE TOUCH ULTRA TEST) test strip USE TO TEST BLOOD GLUCOSE 2 TIMES DAILY AS DIRECTED 100 each 0  . ibuprofen (ADVIL,MOTRIN) 200 MG tablet Take 200 mg by mouth every 6 (six) hours as needed.    [name] levonorgestrel (MIRENA) 20 MCG/24HR IUD 1 each by Intrauterine route once.    . metFORMIN (GLUCOPHAGE) 1000 MG tablet TAKE 1 TABLET (1,000 MG TOTAL) BY MOUTH 2 (TWO) TIMES DAILY WITH A MEAL. 180 tablet 1  . OneTouch Delica Lancets 35W MISC USE TO TEST BLOOD GLUCOSE UP TO 2 TIMES DAILY AS DIRECTED 100 each 0  . Polyethyl Glycol-Propyl Glycol (SYSTANE) 0.4-0.3 % SOLN Apply to eye.    . Pseudoephedrine-Acetaminophen (ALKA-SELTZER PLUS COLD/SINUS PO) Take by mouth.    . Vitamin D, Cholecalciferol, 50 MCG (2000 UT) CAPS Take 1 capsule by mouth daily.     Current Facility-Administered Medications on File Prior to Visit  Medication Dose Route Frequency Provider Last Rate Last Dose  . levonorgestrel (MIRENA) 20 MCG/24HR IUD   Intrauterine Once [name], [name], MD        PAST MEDICAL HISTORY: Past Medical History:  Diagnosis Date  . Allergy   . Back pain   . CHF (congestive heart failure) ([place])   . Diabetes ([place])   . Dyspnea   . Gallbladder problem   . GERD (gastroesophageal reflux  disease)   . Hip pain   . Hyperlipemia   . Hypertension   . Knee pain   . Leg edema   . Obesity   . Sleep apnea   . Venous insufficiency     PAST SURGICAL HISTORY: Past Surgical History:  Procedure Laterality Date  . Birth Elta [place] removed    . CHOLECYSTECTOMY    . INTRAUTERINE DEVICE INSERTION  12/31/2013   Mirena    SOCIAL HISTORY: Social History   Tobacco Use  . Smoking status: Never Smoker  . Smokeless tobacco: Never Used  Substance Use Topics  . Alcohol use: Yes    Alcohol/week: 0.0 standard drinks    Comment: Rare  . Drug use: No    FAMILY HISTORY: Family History  Problem Relation Age of Onset  . Hypertension Father   . Diabetes Father   . Obesity Father   . Cancer Paternal Grandfather        Stomach  . Cancer Maternal Uncle        Pancreatic  . Cancer Paternal Uncle        Kidney  . Hypertension Mother   . Anxiety  disorder Mother    ROS: Review of Systems  Respiratory: Negative for shortness of breath.   Cardiovascular: Negative for chest pain.  Endo/Heme/Allergies:       Negative for polyphagia.   PHYSICAL EXAM: Pt in no acute distress  RECENT LABS AND TESTS: BMET    Component Value Date/Time   NA 140 02/12/2018 1315   K 3.7 02/12/2018 1315   CL 99 02/12/2018 1315   CO2 25 02/12/2018 1315   GLUCOSE 91 02/12/2018 1315   GLUCOSE 115 (H) 04/10/2017 1656   BUN 15 02/12/2018 1315   CREATININE 0.56 (L) 02/12/2018 1315   CALCIUM 9.6 02/12/2018 1315   GFRNONAA 108 02/12/2018 1315   GFRAA 124 02/12/2018 1315   Lab Results  Component Value Date   HGBA1C 6.0 (H) 02/12/2018   HGBA1C 7.2 (H) 10/20/2017   HGBA1C 7.3 (H) 07/14/2017   HGBA1C 7.7 (H) 04/03/2017   HGBA1C 7.4 (H) 09/13/2016   Lab Results  Component Value Date   INSULIN 8.9 02/12/2018   INSULIN 9.1 10/20/2017   CBC    Component Value Date/Time   WBC 7.9 10/20/2017 0940   WBC 10.6 (H) 04/05/2017 0443   RBC 5.35 (H) 10/20/2017 0940   RBC 5.16 (H) 04/05/2017 0443   HGB 15.0  10/20/2017 0940   HCT 48.0 (H) 10/20/2017 0940   PLT 265 04/05/2017 0443   MCV 90 10/20/2017 0940   MCH 28.0 10/20/2017 0940   MCH 27.3 04/05/2017 0443   MCHC 31.3 (L) 10/20/2017 0940   MCHC 29.4 (L) 04/05/2017 0443   RDW 16.0 (H) 10/20/2017 0940   LYMPHSABS 1.2 10/20/2017 0940   MONOABS 0.7 04/05/2017 0443   EOSABS 0.1 10/20/2017 0940   BASOSABS 0.1 10/20/2017 0940   Iron/TIBC/Ferritin/ %Sat No results found for: IRON, TIBC, FERRITIN, IRONPCTSAT Lipid Panel     Component Value Date/Time   CHOL 127 10/20/2017 0940   TRIG 91 10/20/2017 0940   HDL 47 10/20/2017 0940   CHOLHDL 3 09/13/2016 0958   VLDL 21.8 09/13/2016 0958   LDLCALC 62 10/20/2017 0940   Hepatic Function Panel     Component Value Date/Time   PROT 7.5 02/12/2018 1315   ALBUMIN 4.5 02/12/2018 1315   AST 30 02/12/2018 1315   ALT 65 (H) 02/12/2018 1315   ALKPHOS 161 (H) 02/12/2018 1315   BILITOT 0.7 02/12/2018 1315      Component Value Date/Time   TSH 2.790 10/20/2017 0940   Results for YANA, SCHORR (MRN [id number]) as of 06/22/2018 15:47  Ref. Range 02/12/2018 13:15  Vitamin D, 25-Hydroxy Latest Ref Range: 30.0 - 100.0 ng/mL 52.4   I, Michaelene Song, am acting as Location manager for [employer], FNP-C.  I have reviewed the above documentation for accuracy and completeness, and I agree with the above.  - Lauree Yurick, FNP-C.

## 2018-07-13 ENCOUNTER — Other Ambulatory Visit: Payer: Self-pay

## 2018-07-13 ENCOUNTER — Ambulatory Visit (INDEPENDENT_AMBULATORY_CARE_PROVIDER_SITE_OTHER): Payer: BC Managed Care – PPO | Admitting: Family Medicine

## 2018-07-13 ENCOUNTER — Encounter (INDEPENDENT_AMBULATORY_CARE_PROVIDER_SITE_OTHER): Payer: Self-pay | Admitting: Family Medicine

## 2018-07-13 DIAGNOSIS — I509 Heart failure, unspecified: Secondary | ICD-10-CM | POA: Diagnosis not present

## 2018-07-13 DIAGNOSIS — I10 Essential (primary) hypertension: Secondary | ICD-10-CM | POA: Diagnosis not present

## 2018-07-13 DIAGNOSIS — E119 Type 2 diabetes mellitus without complications: Secondary | ICD-10-CM | POA: Diagnosis not present

## 2018-07-13 DIAGNOSIS — Z6841 Body Mass Index (BMI) 40.0 and over, adult: Secondary | ICD-10-CM

## 2018-07-13 MED ORDER — ONETOUCH DELICA LANCETS 33G MISC: 0 refills | Status: DC

## 2018-07-13 MED ORDER — LOSARTAN POTASSIUM 100 MG PO TABS: 100.0000 mg | ORAL_TABLET | Freq: Every day | ORAL | 0 refills | Status: DC

## 2018-07-13 NOTE — Progress Notes (Signed)
Office: [phone]  /  Fax: [phone] TeleHealth Visit:  [name] has verbally consented to this TeleHealth visit today. The patient is located at work, the provider is located at the [employer] [facility] office. The participants in this visit include the listed provider and patient and any and all parties involved. The visit was conducted today via telephone. [name] was unable to use realtime audiovisual technology today (unable to do FaceTime) and the telehealth visit was conducted via telephone.  HPI:   Chief Complaint: OBESITY [name] is here to discuss her progress with her obesity treatment plan. She is on the Category 4 plan and is following her eating plan the majority of the time. She states she is walking 35 to 40 minutes 4 to 5 times per week. [name] is 291 pounds today, and she has lost 2 pounds since last visit. [name] is happy with the plan and she is doing very well. Her stress is decreased because school is almost over. 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 We were unable to weigh the patient today for this TeleHealth visit. She feels as if she has lost weight since her last visit. She has lost 87 lbs since starting treatment with [place].  Hypertension [name] is a 53 y.o. female with hypertension. Her blood pressure is at 126/70 today. [name] denies chest pain or shortness of breath on exertion. She is working weight loss to help control her blood pressure with the goal of decreasing her risk of heart attack and stroke. [name] blood pressure is well controlled. BP Readings from Last 3 Encounters:  04/22/18 (!) 144/76  03/17/18 (!) 146/74  03/03/18 120/78    Diabetes II [name] has a diagnosis of diabetes type II. [name] states fasting BGs and 2 hour post prandial BGs are in the low 100's and is well controlled. [name] denies any hypoglycemic episodes. Last A1c was at 6.0 on  02/12/18. She has been working on intensive lifestyle modifications including diet, exercise, and weight loss to help control her blood glucose levels. She denies polyphagia.  CHF [name] was hospitalized for CHF in 2019. She saw her cardiologist 12/2017 and he told her HF was essentially resolved because of weight loss. She has lost quite a bit more weight since then.   ASSESSMENT AND PLAN:  Essential hypertension - Plan: losartan (COZAAR) 100 MG tablet  Type 2 diabetes mellitus without complication, without long-term current use of insulin (HCC) - Plan: OneTouch Delica Lancets 16W MISC  Acute congestive heart failure, unspecified heart failure type (HCC)  Class 3 severe obesity with serious comorbidity and body mass index (BMI) of 45.0 to 49.9 in adult, unspecified obesity type ([place])  PLAN:  Hypertension We discussed sodium restriction, working on healthy weight loss, and a regular exercise program as the means to achieve improved blood pressure control. [name] agreed with this plan and agreed to follow up as directed. We will continue to monitor her blood pressure as well as her progress with the above lifestyle modifications. She agrees to continue Losartan 100 mg daily #30 with no refills and will watch for signs of hypotension as she continues her lifestyle modifications.  Diabetes II [name] has been given extensive diabetes education by myself today including ideal fasting and post-prandial blood glucose readings, individual ideal Hgb A1c goals and hypoglycemia prevention. We discussed the importance of good blood sugar control to decrease the likelihood of diabetic complications such as  nephropathy, neuropathy, limb loss, blindness, coronary artery disease, and death. We discussed the importance of intensive lifestyle modification including diet, exercise and weight loss as the first line treatment for diabetes. [name] agrees to test her blood sugar two times daily lancets #100 with no  refills and follow up at the agreed upon time. We will check Hgb A1c at the next visit.   CHF I feel her risk is minimal to return to in-person office visit with our clinic. She will continue to follow up with cardiology. Next appointment is November 2020. Her cardiologist told her she will likely not have to follow up after that.    Obesity [name] is currently in the action stage of change. As such, her goal is to continue with weight loss efforts She has agreed to follow the Category 4 plan [name] will continue her current exercise regimen for weight loss and overall health benefits. We discussed the following Behavioral Modification Strategies today: planning for success  [name] has agreed to follow up with our clinic in 2 weeks. She was informed of the importance of frequent follow up visits to maximize her success with intensive lifestyle modifications for her multiple health conditions.  ALLERGIES: No Known Allergies  MEDICATIONS: Current Outpatient Medications on File Prior to Visit  Medication Sig Dispense Refill   Ascorbic Acid (VITAMIN C PO) Take by mouth.     atorvastatin (LIPITOR) 20 MG tablet Take 1 tablet (20 mg total) by mouth daily. 90 tablet 3   blood glucose meter kit and supplies Dispense based on patient and insurance preference. Use up to twice daily as directed. (FOR ICD-10 E10.9, E11.9). 1 each 0   [facility] ASPIRIN LOW STRENGTH 81 MG EC tablet TAKE 1 TABLET BY MOUTH EVERY DAY 90 tablet 1   furosemide (LASIX) 40 MG tablet TAKE 1 TABLET BY MOUTH EVERY DAY 90 tablet 1   glucose blood (ONE TOUCH ULTRA TEST) test strip USE TO TEST BLOOD GLUCOSE 2 TIMES DAILY AS DIRECTED 100 each 0   ibuprofen (ADVIL,MOTRIN) 200 MG tablet Take 200 mg by mouth every 6 (six) hours as needed.     levonorgestrel (MIRENA) 20 MCG/24HR IUD 1 each by Intrauterine route once.     metFORMIN (GLUCOPHAGE) 1000 MG tablet TAKE 1 TABLET (1,000 MG TOTAL) BY MOUTH 2 (TWO) TIMES DAILY WITH A MEAL.  180 tablet 1   metoprolol succinate (TOPROL-XL) 50 MG 24 hr tablet Take 1 tablet (50 mg total) by mouth daily. Take with or immediately following a meal. 30 tablet 0   Polyethyl Glycol-Propyl Glycol (SYSTANE) 0.4-0.3 % SOLN Apply to eye.     Pseudoephedrine-Acetaminophen (ALKA-SELTZER PLUS COLD/SINUS PO) Take by mouth.     Vitamin D, Cholecalciferol, 50 MCG (2000 UT) CAPS Take 1 capsule by mouth daily.     Current Facility-Administered Medications on File Prior to Visit  Medication Dose Route Frequency Provider Last Rate Last Dose   levonorgestrel (MIRENA) 20 MCG/24HR IUD   Intrauterine Once [name], [name], MD        PAST MEDICAL HISTORY: Past Medical History:  Diagnosis Date   Allergy    Back pain    CHF (congestive heart failure) (HCC)    Diabetes ([place])    Dyspnea    Gallbladder problem    GERD (gastroesophageal reflux disease)    Hip pain    Hyperlipemia    Hypertension    Knee pain    Leg edema    Obesity    Sleep apnea  Venous insufficiency     PAST SURGICAL HISTORY: Past Surgical History:  Procedure Laterality Date   Birth Elta [place] removed     CHOLECYSTECTOMY     INTRAUTERINE DEVICE INSERTION  12/31/2013   Mirena    SOCIAL HISTORY: Social History   Tobacco Use   Smoking status: Never Smoker   Smokeless tobacco: Never Used  Substance Use Topics   Alcohol use: Yes    Alcohol/week: 0.0 standard drinks    Comment: Rare   Drug use: No    FAMILY HISTORY: Family History  Problem Relation Age of Onset   Hypertension Father    Diabetes Father    Obesity Father    Cancer Paternal Grandfather        Stomach   Cancer Maternal Uncle        Pancreatic   Cancer Paternal Uncle        Kidney   Hypertension Mother    Anxiety disorder Mother     ROS: Review of Systems  Constitutional: Positive for weight loss.  Respiratory: Negative for shortness of breath (on exertion).   Cardiovascular: Negative for chest pain.    Endo/Heme/Allergies:       Negative for polyphagia Negative for hypoglycemia    PHYSICAL EXAM: Pt in no acute distress  RECENT LABS AND TESTS: BMET    Component Value Date/Time   NA 140 02/12/2018 1315   K 3.7 02/12/2018 1315   CL 99 02/12/2018 1315   CO2 25 02/12/2018 1315   GLUCOSE 91 02/12/2018 1315   GLUCOSE 115 (H) 04/10/2017 1656   BUN 15 02/12/2018 1315   CREATININE 0.56 (L) 02/12/2018 1315   CALCIUM 9.6 02/12/2018 1315   GFRNONAA 108 02/12/2018 1315   GFRAA 124 02/12/2018 1315   Lab Results  Component Value Date   HGBA1C 6.0 (H) 02/12/2018   HGBA1C 7.2 (H) 10/20/2017   HGBA1C 7.3 (H) 07/14/2017   HGBA1C 7.7 (H) 04/03/2017   HGBA1C 7.4 (H) 09/13/2016   Lab Results  Component Value Date   INSULIN 8.9 02/12/2018   INSULIN 9.1 10/20/2017   CBC    Component Value Date/Time   WBC 7.9 10/20/2017 0940   WBC 10.6 (H) 04/05/2017 0443   RBC 5.35 (H) 10/20/2017 0940   RBC 5.16 (H) 04/05/2017 0443   HGB 15.0 10/20/2017 0940   HCT 48.0 (H) 10/20/2017 0940   PLT 265 04/05/2017 0443   MCV 90 10/20/2017 0940   MCH 28.0 10/20/2017 0940   MCH 27.3 04/05/2017 0443   MCHC 31.3 (L) 10/20/2017 0940   MCHC 29.4 (L) 04/05/2017 0443   RDW 16.0 (H) 10/20/2017 0940   LYMPHSABS 1.2 10/20/2017 0940   MONOABS 0.7 04/05/2017 0443   EOSABS 0.1 10/20/2017 0940   BASOSABS 0.1 10/20/2017 0940   Iron/TIBC/Ferritin/ %Sat No results found for: IRON, TIBC, FERRITIN, IRONPCTSAT Lipid Panel     Component Value Date/Time   CHOL 127 10/20/2017 0940   TRIG 91 10/20/2017 0940   HDL 47 10/20/2017 0940   CHOLHDL 3 09/13/2016 0958   VLDL 21.8 09/13/2016 0958   LDLCALC 62 10/20/2017 0940   Hepatic Function Panel     Component Value Date/Time   PROT 7.5 02/12/2018 1315   ALBUMIN 4.5 02/12/2018 1315   AST 30 02/12/2018 1315   ALT 65 (H) 02/12/2018 1315   ALKPHOS 161 (H) 02/12/2018 1315   BILITOT 0.7 02/12/2018 1315      Component Value Date/Time   TSH 2.790 10/20/2017 0940     Results  for CHERREE, CONERLY (MRN [id number]) as of 07/13/2018 11:19  Ref. Range 02/12/2018 13:15  Vitamin D, 25-Hydroxy Latest Ref Range: 30.0 - 100.0 ng/mL 52.4    I, Doreene Nest, am acting as Location manager for [employer], FNP-C.  I have reviewed the above documentation for accuracy and completeness, and I agree with the above.  - Dawn Whitmire, FNP-C.

## 2018-07-16 ENCOUNTER — Telehealth: Payer: Self-pay | Admitting: *Deleted

## 2018-07-16 ENCOUNTER — Encounter (INDEPENDENT_AMBULATORY_CARE_PROVIDER_SITE_OTHER): Payer: Self-pay | Admitting: Family Medicine

## 2018-07-16 ENCOUNTER — Other Ambulatory Visit (INDEPENDENT_AMBULATORY_CARE_PROVIDER_SITE_OTHER): Payer: Self-pay | Admitting: Family Medicine

## 2018-07-16 DIAGNOSIS — E119 Type 2 diabetes mellitus without complications: Secondary | ICD-10-CM

## 2018-07-16 NOTE — Telephone Encounter (Signed)
Left message Dr Tresa Endo made some pressure changes to her BIPAP machine. Call back if this does not help after at least 1 week of use.

## 2018-07-20 ENCOUNTER — Other Ambulatory Visit (INDEPENDENT_AMBULATORY_CARE_PROVIDER_SITE_OTHER): Payer: Self-pay | Admitting: Family Medicine

## 2018-07-20 ENCOUNTER — Encounter (INDEPENDENT_AMBULATORY_CARE_PROVIDER_SITE_OTHER): Payer: Self-pay | Admitting: Family Medicine

## 2018-07-20 DIAGNOSIS — E119 Type 2 diabetes mellitus without complications: Secondary | ICD-10-CM

## 2018-07-20 NOTE — Telephone Encounter (Signed)
Please advise. Thank you

## 2018-07-20 NOTE — Telephone Encounter (Signed)
PLEASE ADVISE.

## 2018-07-21 MED ORDER — GLUCOSE BLOOD VI STRP: ORAL_STRIP | 0 refills | Status: DC

## 2018-07-27 ENCOUNTER — Encounter (INDEPENDENT_AMBULATORY_CARE_PROVIDER_SITE_OTHER): Payer: Self-pay | Admitting: Family Medicine

## 2018-07-27 ENCOUNTER — Other Ambulatory Visit: Payer: Self-pay

## 2018-07-27 ENCOUNTER — Ambulatory Visit (INDEPENDENT_AMBULATORY_CARE_PROVIDER_SITE_OTHER): Payer: BC Managed Care – PPO | Admitting: Family Medicine

## 2018-07-27 DIAGNOSIS — E119 Type 2 diabetes mellitus without complications: Secondary | ICD-10-CM

## 2018-07-27 DIAGNOSIS — Z6841 Body Mass Index (BMI) 40.0 and over, adult: Secondary | ICD-10-CM

## 2018-07-28 NOTE — Progress Notes (Signed)
Office: [phone]  /  Fax: [phone] TeleHealth Visit:  [name] has verbally consented to this TeleHealth visit today. The patient is located at home, the provider is located at the [facility] office. The participants in this visit include the listed provider and patient. The visit was conducted today via FaceTime.  HPI:   Chief Complaint: OBESITY [name] is here to discuss her progress with her obesity treatment plan. She is on the Category 4 plan and is following her eating plan approximately 85-90% of the time. She states she is walking 50 minutes 4-5 times per week. [name] states her weight is maintained at 291 lbs. 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 She states work increased stress eating. She has increased her walking. She reports she gets bored with breakfast.  We were unable to weigh the patient today for this TeleHealth visit. She feels as if she has maintained her weight since her last visit. She has lost 83 lbs since starting treatment with [place].  Diabetes Mellitus [name] has a diagnosis of diabetes mellitus, well controlled on metformin only. [name] states fasting CBG's are in the range of 100 and 110.  She denies any hypoglycemic episodes. Last A1c was 6.0 on 02/12/2018. She has been working on intensive lifestyle modifications including diet, exercise, and weight loss to help control her blood glucose levels.  ASSESSMENT AND PLAN:  Type 2 diabetes mellitus without complication, without long-term current use of insulin (HCC)  Class 3 severe obesity with serious comorbidity and body mass index (BMI) of 45.0 to 49.9 in adult, unspecified obesity type ([place])  PLAN:  Diabetes Mellitus [name] has been given extensive diabetes education by myself today including ideal fasting and post-prandial blood glucose readings, individual ideal HgA1c goals  and  hypoglycemia prevention. We discussed the importance of good blood sugar control to decrease the likelihood of diabetic complications such as nephropathy, neuropathy, limb loss, blindness, coronary artery disease, and death. We discussed the importance of intensive lifestyle modification including diet, exercise and weight loss as the first line treatment for diabetes. [name] will continue metformin and follow-up at the agreed upon time.  I spent > than 50% of the 15 minute visit on counseling as documented in the note.  Obesity [name] is currently in the action stage of change. As such, her goal is to continue with weight loss efforts. She has agreed to follow the Category 4 plan. Handouts were sent to the patient via MyChart on Low Carb and Breakfast Options. [name] has been instructed to continue her current exercise regimen for weight loss and overall health benefits. We discussed the following Behavioral Modification Strategies today: work on meal planning, easy cooking plans, and planning for success.  [name] has agreed to follow-up with our clinic in 2 weeks. She was informed of the importance of frequent follow-up visits to maximize her success with intensive lifestyle modifications for her multiple health conditions.  ALLERGIES: No Known Allergies  MEDICATIONS: Current Outpatient Medications on File Prior to Visit  Medication Sig Dispense Refill  . Ascorbic Acid (VITAMIN C PO) Take by mouth.    [name] atorvastatin (LIPITOR) 20 MG tablet Take 1 tablet (20 mg total) by mouth daily. 90 tablet 3  . blood glucose meter kit and supplies Dispense based on patient and insurance preference. Use up to twice daily as directed. (FOR ICD-10 E10.9, E11.9). 1  each 0  . [facility] ASPIRIN LOW STRENGTH 81 MG EC tablet TAKE 1 TABLET BY MOUTH EVERY DAY 90 tablet 1  . furosemide (LASIX) 40 MG tablet TAKE 1 TABLET BY MOUTH EVERY DAY 90 tablet 1  . glucose blood (ONE TOUCH ULTRA TEST) test strip USE TO TEST  BLOOD GLUCOSE 2 TIMES DAILY AS DIRECTED 100 each 0  . ibuprofen (ADVIL,MOTRIN) 200 MG tablet Take 200 mg by mouth every 6 (six) hours as needed.    [name] levonorgestrel (MIRENA) 20 MCG/24HR IUD 1 each by Intrauterine route once.    [name] losartan (COZAAR) 100 MG tablet Take 1 tablet (100 mg total) by mouth daily. 30 tablet 0  . metFORMIN (GLUCOPHAGE) 1000 MG tablet TAKE 1 TABLET (1,000 MG TOTAL) BY MOUTH 2 (TWO) TIMES DAILY WITH A MEAL. 180 tablet 1  . metoprolol succinate (TOPROL-XL) 50 MG 24 hr tablet Take 1 tablet (50 mg total) by mouth daily. Take with or immediately following a meal. 30 tablet 0  . OneTouch Delica Lancets 44I MISC USE TO TEST BLOOD GLUCOSE UP TO 2 TIMES DAILY AS DIRECTED 100 each 0  . Polyethyl Glycol-Propyl Glycol (SYSTANE) 0.4-0.3 % SOLN Apply to eye.    . Pseudoephedrine-Acetaminophen (ALKA-SELTZER PLUS COLD/SINUS PO) Take by mouth.    . Vitamin D, Cholecalciferol, 50 MCG (2000 UT) CAPS Take 1 capsule by mouth daily.     Current Facility-Administered Medications on File Prior to Visit  Medication Dose Route Frequency Provider Last Rate Last Dose  . levonorgestrel (MIRENA) 20 MCG/24HR IUD   Intrauterine Once [name], [name], MD        PAST MEDICAL HISTORY: Past Medical History:  Diagnosis Date  . Allergy   . Back pain   . CHF (congestive heart failure) ([place])   . Diabetes ([place])   . Dyspnea   . Gallbladder problem   . GERD (gastroesophageal reflux disease)   . Hip pain   . Hyperlipemia   . Hypertension   . Knee pain   . Leg edema   . Obesity   . Sleep apnea   . Venous insufficiency     PAST SURGICAL HISTORY: Past Surgical History:  Procedure Laterality Date  . Birth Elta [place] removed    . CHOLECYSTECTOMY    . INTRAUTERINE DEVICE INSERTION  12/31/2013   Mirena    SOCIAL HISTORY: Social History   Tobacco Use  . Smoking status: Never Smoker  . Smokeless tobacco: Never Used  Substance Use Topics  . Alcohol use: Yes    Alcohol/week: 0.0 standard drinks     Comment: Rare  . Drug use: No    FAMILY HISTORY: Family History  Problem Relation Age of Onset  . Hypertension Father   . Diabetes Father   . Obesity Father   . Cancer Paternal Grandfather        Stomach  . Cancer Maternal Uncle        Pancreatic  . Cancer Paternal Uncle        Kidney  . Hypertension Mother   . Anxiety disorder Mother    ROS: Review of Systems  Endo/Heme/Allergies:       Negative for hypoglycemia.   PHYSICAL EXAM: Pt in no acute distress  RECENT LABS AND TESTS: BMET    Component Value Date/Time   NA 140 02/12/2018 1315   K 3.7 02/12/2018 1315   CL 99 02/12/2018 1315   CO2 25 02/12/2018 1315   GLUCOSE 91 02/12/2018 1315   GLUCOSE 115 (H) 04/10/2017  1656   BUN 15 02/12/2018 1315   CREATININE 0.56 (L) 02/12/2018 1315   CALCIUM 9.6 02/12/2018 1315   GFRNONAA 108 02/12/2018 1315   GFRAA 124 02/12/2018 1315   Lab Results  Component Value Date   HGBA1C 6.0 (H) 02/12/2018   HGBA1C 7.2 (H) 10/20/2017   HGBA1C 7.3 (H) 07/14/2017   HGBA1C 7.7 (H) 04/03/2017   HGBA1C 7.4 (H) 09/13/2016   Lab Results  Component Value Date   INSULIN 8.9 02/12/2018   INSULIN 9.1 10/20/2017   CBC    Component Value Date/Time   WBC 7.9 10/20/2017 0940   WBC 10.6 (H) 04/05/2017 0443   RBC 5.35 (H) 10/20/2017 0940   RBC 5.16 (H) 04/05/2017 0443   HGB 15.0 10/20/2017 0940   HCT 48.0 (H) 10/20/2017 0940   PLT 265 04/05/2017 0443   MCV 90 10/20/2017 0940   MCH 28.0 10/20/2017 0940   MCH 27.3 04/05/2017 0443   MCHC 31.3 (L) 10/20/2017 0940   MCHC 29.4 (L) 04/05/2017 0443   RDW 16.0 (H) 10/20/2017 0940   LYMPHSABS 1.2 10/20/2017 0940   MONOABS 0.7 04/05/2017 0443   EOSABS 0.1 10/20/2017 0940   BASOSABS 0.1 10/20/2017 0940   Iron/TIBC/Ferritin/ %Sat No results found for: IRON, TIBC, FERRITIN, IRONPCTSAT Lipid Panel     Component Value Date/Time   CHOL 127 10/20/2017 0940   TRIG 91 10/20/2017 0940   HDL 47 10/20/2017 0940   CHOLHDL 3 09/13/2016 0958   VLDL  21.8 09/13/2016 0958   LDLCALC 62 10/20/2017 0940   Hepatic Function Panel     Component Value Date/Time   PROT 7.5 02/12/2018 1315   ALBUMIN 4.5 02/12/2018 1315   AST 30 02/12/2018 1315   ALT 65 (H) 02/12/2018 1315   ALKPHOS 161 (H) 02/12/2018 1315   BILITOT 0.7 02/12/2018 1315      Component Value Date/Time   TSH 2.790 10/20/2017 0940   Results for ALVILDA, MCKENNA (MRN [id number]) as of 07/28/2018 07:41  Ref. Range 02/12/2018 13:15  Vitamin D, 25-Hydroxy Latest Ref Range: 30.0 - 100.0 ng/mL 52.4    I, Michaelene Song, am acting as Location manager for [employer], FNP  I have reviewed the above documentation for accuracy and completeness, and I agree with the above.  - Mayuri Staples, FNP-C.

## 2018-08-03 ENCOUNTER — Ambulatory Visit: Payer: BC Managed Care – PPO | Admitting: Family Medicine

## 2018-08-03 ENCOUNTER — Encounter: Payer: BC Managed Care – PPO | Admitting: Family Medicine

## 2018-08-03 IMAGING — CR DG CHEST 2V
2 series · 2 of 2 positions shown · non-contrast
Comparison: None

CLINICAL DATA: Congestion, shortness of breath, coughing

EXAM:
CHEST  2 VIEW

[w chest pa]
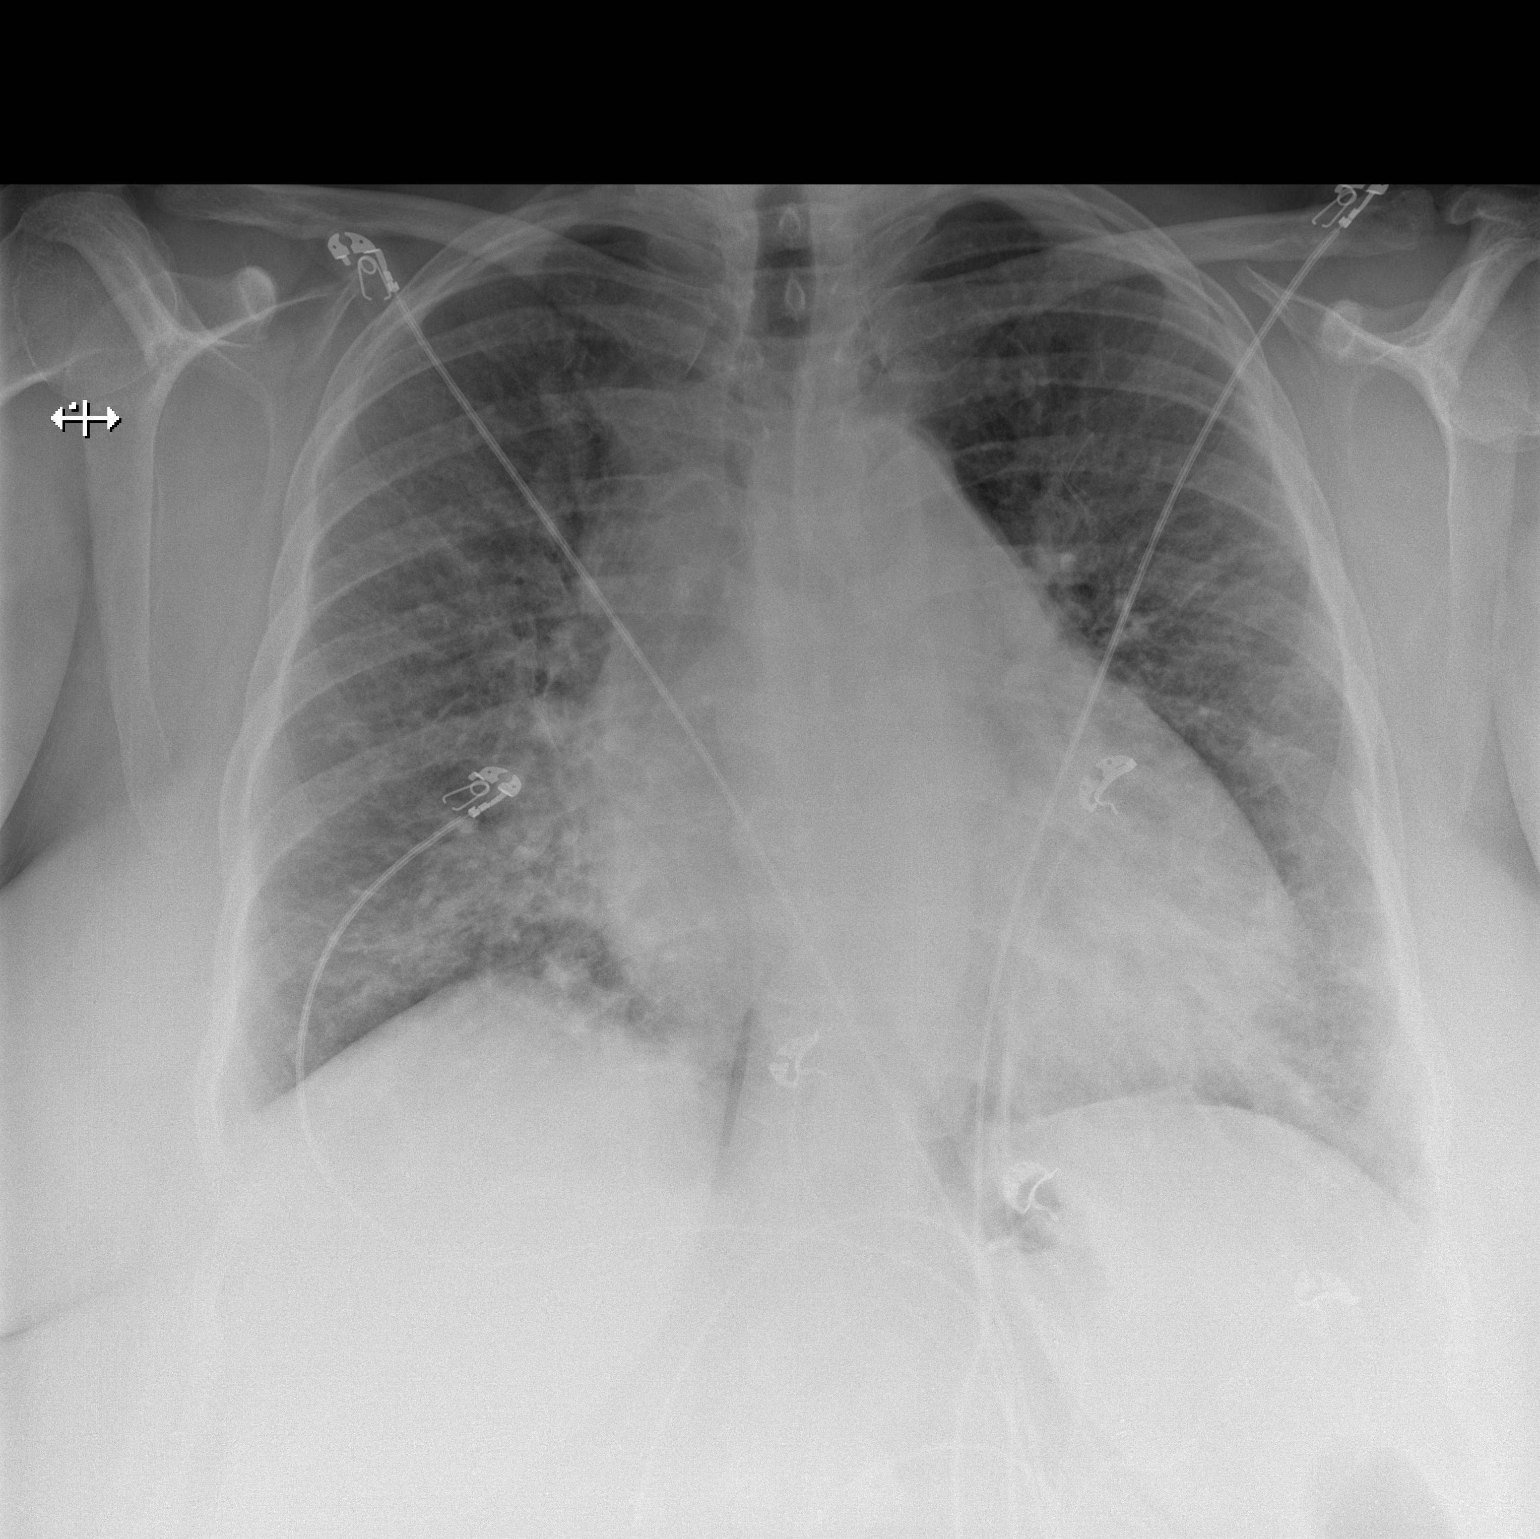

[w chest lat]
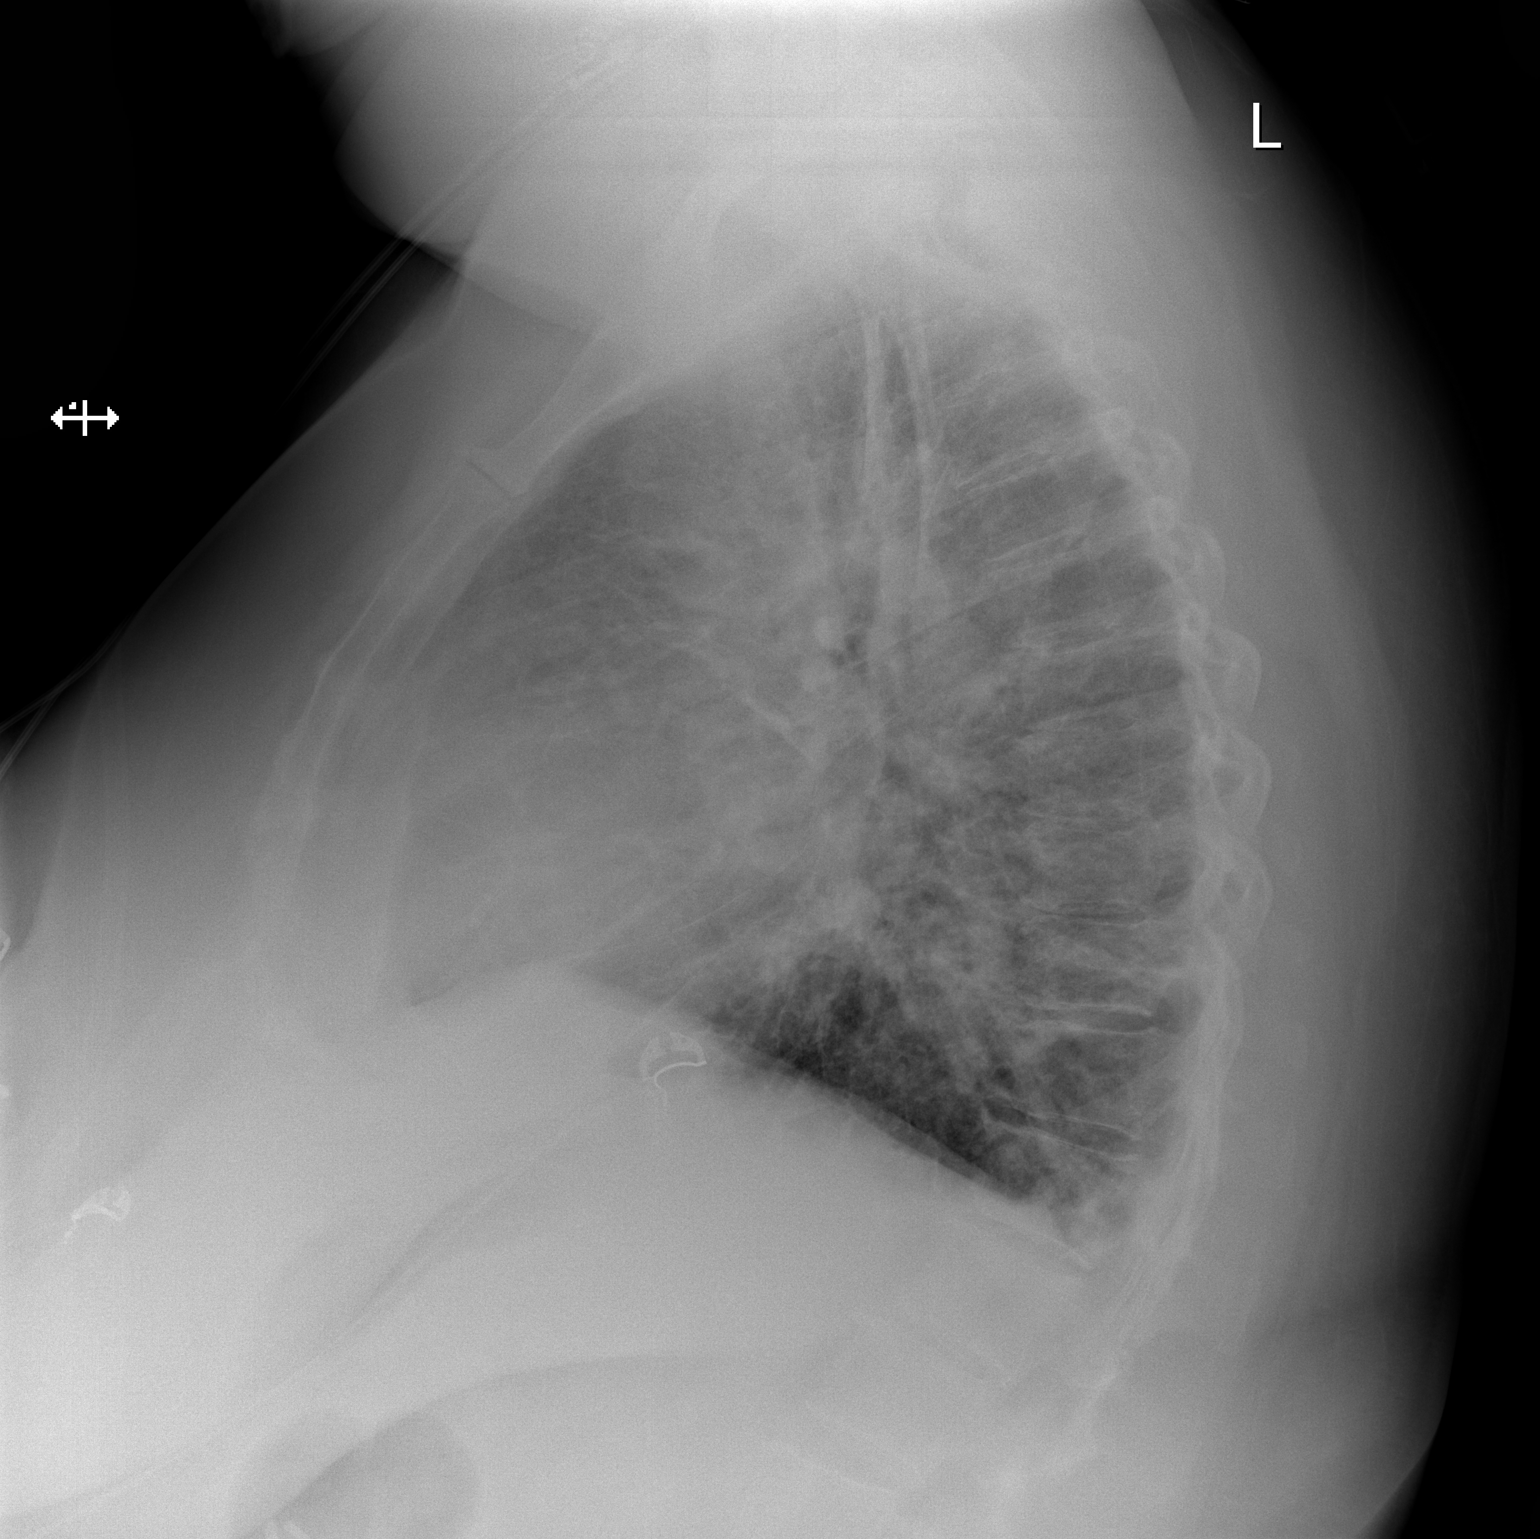

[2 of 2 positions shown; findings below may reference images not displayed]

FINDINGS: Diffuse bilateral interstitial thickening. No pleural effusion or
pneumothorax. Stable cardiomegaly. No acute osseous abnormality.
IMPRESSION: Findings concerning for mild CHF.

## 2018-08-10 ENCOUNTER — Other Ambulatory Visit: Payer: Self-pay

## 2018-08-10 ENCOUNTER — Encounter (INDEPENDENT_AMBULATORY_CARE_PROVIDER_SITE_OTHER): Payer: Self-pay | Admitting: Family Medicine

## 2018-08-10 ENCOUNTER — Ambulatory Visit (INDEPENDENT_AMBULATORY_CARE_PROVIDER_SITE_OTHER): Payer: BC Managed Care – PPO | Admitting: Family Medicine

## 2018-08-10 VITALS — BP 124/74 | HR 56 | Temp 97.6°F | Ht 65.0 in | Wt 286.0 lb

## 2018-08-10 DIAGNOSIS — Z9189 Other specified personal risk factors, not elsewhere classified: Secondary | ICD-10-CM

## 2018-08-10 DIAGNOSIS — E559 Vitamin D deficiency, unspecified: Secondary | ICD-10-CM

## 2018-08-10 DIAGNOSIS — E119 Type 2 diabetes mellitus without complications: Secondary | ICD-10-CM

## 2018-08-10 DIAGNOSIS — I10 Essential (primary) hypertension: Secondary | ICD-10-CM | POA: Diagnosis not present

## 2018-08-10 DIAGNOSIS — Z6841 Body Mass Index (BMI) 40.0 and over, adult: Secondary | ICD-10-CM

## 2018-08-11 ENCOUNTER — Encounter (INDEPENDENT_AMBULATORY_CARE_PROVIDER_SITE_OTHER): Payer: Self-pay | Admitting: Family Medicine

## 2018-08-11 LAB — COMPREHENSIVE METABOLIC PANEL
ALT: 37 IU/L — ABNORMAL HIGH (ref 0–32)
AST: 22 IU/L (ref 0–40)
Albumin/Globulin Ratio: 1.8 (ref 1.2–2.2)
Albumin: 4.6 g/dL (ref 3.8–4.9)
Alkaline Phosphatase: 148 IU/L — ABNORMAL HIGH (ref 39–117)
BUN/Creatinine Ratio: 43 — ABNORMAL HIGH (ref 9–23)
BUN: 23 mg/dL (ref 6–24)
Bilirubin Total: 0.5 mg/dL (ref 0.0–1.2)
CO2: 25 mmol/L (ref 20–29)
Calcium: 9.5 mg/dL (ref 8.7–10.2)
Chloride: 104 mmol/L (ref 96–106)
Creatinine, Ser: 0.54 mg/dL — ABNORMAL LOW (ref 0.57–1.00)
GFR calc Af Amer: 125 mL/min/{1.73_m2} (ref >59)
GFR calc non Af Amer: 109 mL/min/{1.73_m2} (ref >59)
Globulin, Total: 2.6 g/dL (ref 1.5–4.5)
Glucose: 86 mg/dL (ref 65–99)
Potassium: 4 mmol/L (ref 3.5–5.2)
Sodium: 144 mmol/L (ref 134–144)
Total Protein: 7.2 g/dL (ref 6.0–8.5)

## 2018-08-11 LAB — HEMOGLOBIN A1C
Est. average glucose Bld gHb Est-mCnc: 108 mg/dL
Hgb A1c MFr Bld: 5.4 % (ref 4.8–5.6)

## 2018-08-11 LAB — VITAMIN D 25 HYDROXY (VIT D DEFICIENCY, FRACTURES): Vit D, 25-Hydroxy: 58.9 ng/mL (ref 30.0–100.0)

## 2018-08-11 MED ORDER — METOPROLOL SUCCINATE ER 50 MG PO TB24: 50.0000 mg | ORAL_TABLET | Freq: Every day | ORAL | 0 refills | Status: DC

## 2018-08-11 MED ORDER — LOSARTAN POTASSIUM 100 MG PO TABS: 100.0000 mg | ORAL_TABLET | Freq: Every day | ORAL | 0 refills | Status: DC

## 2018-08-11 NOTE — Progress Notes (Signed)
Office: [phone]  /  Fax: [phone]   HPI:   Chief Complaint: OBESITY [name] is here to discuss her progress with her obesity treatment plan. She is on the Category 4 plan and is following her eating plan approximately 80% of the time. She states she is walking 45-50 minutes 4-5 times per week. [name] has reviewed the low carb plan and does not want to start this until after her birthday. Her weight is 286 lb (129.7 kg) today and has had a weight loss of 9 pounds over a period of 16 weeks since her last visit. She has lost 92 lbs since starting treatment with [place].  Hypertension [name] is a 53 y.o. female with hypertension.  [name] denies chest pain or shortness of breath on exertion. She is working weight loss to help control her blood pressure with the goal of decreasing her risk of heart attack and stroke. [name]'s blood pressure is well controlled on losartan and metoprolol.  BP Readings from Last 3 Encounters:  08/10/18 124/74  04/22/18 (!) 144/76  03/17/18 (!) 146/74    At risk for cardiovascular disease [name] is at a higher than average risk for cardiovascular disease due to obesity. She currently denies any chest pain.  Diabetes Mellitus [name] has a diagnosis of diabetes mellitus and is on metformin only. [name] states fasting blood sugars are in the range of 89 and 108; 2-hour postprandials are in the range of 85 and 129. Last A1c was 6.0 on 02/12/2018. She has been working on intensive lifestyle modifications including diet, exercise, and weight loss to help control her blood glucose levels. No polyphagia.  Vitamin D deficiency [name] has a diagnosis of Vitamin D deficiency, which is at goal. She is currently taking OTC Vit D3 2,000 daily and denies nausea, vomiting or muscle weakness.  ASSESSMENT AND PLAN:  Essential hypertension - Plan: losartan (COZAAR) 100 MG tablet, metoprolol succinate (TOPROL-XL) 50 MG 24 hr tablet  Type  2 diabetes mellitus without complication, without long-term current use of insulin (HCC) - Plan: Comprehensive metabolic panel, Hemoglobin A1c  Vitamin D deficiency - Plan: VITAMIN D 25 Hydroxy (Vit-D Deficiency, Fractures)  Class 3 severe obesity with serious comorbidity and body mass index (BMI) of 45.0 to 49.9 in adult, unspecified obesity type ([place])  PLAN:  Hypertension We discussed sodium restriction, working on healthy weight loss, and a regular exercise program as the means to achieve improved blood pressure control. [name] agreed with this plan and agreed to follow up as directed. We will continue to monitor her blood pressure as well as her progress with the above lifestyle modifications. [name] was given a refill on her losartan 100 mg daily #30 with 0 refills and a refill on her metoprolol XL 50 mg QD #30 with 0 refills. She agrees to follow-up with our clinic in 3 weeks and will watch for signs of hypotension as she continues her lifestyle modifications.  Cardiovascular risk counselling [name] was given extended (15 minutes) coronary artery disease prevention counseling today. She is 53 y.o. female and has risk factors for heart disease including obesity. We discussed intensive lifestyle modifications today with an emphasis on specific weight loss instructions and strategies. Pt was also informed of the importance of increasing exercise and decreasing saturated fats to help prevent heart disease.  Diabetes Mellitus [name] has been given extensive diabetes education by myself today including ideal fasting and post-prandial blood glucose readings, individual ideal HgA1c goals  and hypoglycemia prevention. We discussed  the importance of good blood sugar control to decrease the likelihood of diabetic complications such as nephropathy, neuropathy, limb loss, blindness, coronary artery disease, and death. We discussed the importance of intensive lifestyle modification including diet,  exercise and weight loss as the first line treatment for diabetes. [name] will continue metformin (no prescription needed) and will have A1c and CMET checked today. She will follow-up at the agreed upon time.  Vitamin D Deficiency [name] was informed that low Vitamin D levels contributes to fatigue and are associated with obesity, breast, and colon cancer. She agrees to continue taking OTC Vit D and will have routine testing of Vitamin D today. She was informed of the risk of over-replacement of Vitamin D and agrees to not increase her dose unless she discusses this with [place] first. [name] agrees to follow-up with our clinic in 3 weeks.  Obesity [name] is currently in the action stage of change. As such, her goal is to continue with weight loss efforts. She has agreed to follow the Category 4 plan. She may start a low carb plan after her birthday. [name] has been instructed to continue her current exercise regimen for weight loss and overall health benefits. We discussed the following Behavioral Modification Strategies today: planning for success.  [name] has agreed to follow-up with our clinic in 3 weeks. She was informed of the importance of frequent follow-up visits to maximize her success with intensive lifestyle modifications for her multiple health conditions.  ALLERGIES: No Known Allergies  MEDICATIONS: Current Outpatient Medications on File Prior to Visit  Medication Sig Dispense Refill  . Ascorbic Acid (VITAMIN C PO) Take by mouth.    [name] atorvastatin (LIPITOR) 20 MG tablet Take 1 tablet (20 mg total) by mouth daily. 90 tablet 3  . blood glucose meter kit and supplies Dispense based on patient and insurance preference. Use up to twice daily as directed. (FOR ICD-10 E10.9, E11.9). 1 each 0  . [facility] ASPIRIN LOW STRENGTH 81 MG EC tablet TAKE 1 TABLET BY MOUTH EVERY DAY 90 tablet 1  . furosemide (LASIX) 40 MG tablet TAKE 1 TABLET BY MOUTH EVERY DAY 90 tablet 1  . glucose blood (ONE  TOUCH ULTRA TEST) test strip USE TO TEST BLOOD GLUCOSE 2 TIMES DAILY AS DIRECTED 100 each 0  . ibuprofen (ADVIL,MOTRIN) 200 MG tablet Take 200 mg by mouth every 6 (six) hours as needed.    [name] levonorgestrel (MIRENA) 20 MCG/24HR IUD 1 each by Intrauterine route once.    [name] losartan (COZAAR) 100 MG tablet Take 1 tablet (100 mg total) by mouth daily. 30 tablet 0  . metFORMIN (GLUCOPHAGE) 1000 MG tablet TAKE 1 TABLET (1,000 MG TOTAL) BY MOUTH 2 (TWO) TIMES DAILY WITH A MEAL. 180 tablet 1  . metoprolol succinate (TOPROL-XL) 50 MG 24 hr tablet Take 1 tablet (50 mg total) by mouth daily. Take with or immediately following a meal. 30 tablet 0  . OneTouch Delica Lancets 84Y MISC USE TO TEST BLOOD GLUCOSE UP TO 2 TIMES DAILY AS DIRECTED 100 each 0  . Polyethyl Glycol-Propyl Glycol (SYSTANE) 0.4-0.3 % SOLN Apply to eye.    . Pseudoephedrine-Acetaminophen (ALKA-SELTZER PLUS COLD/SINUS PO) Take by mouth.    . Vitamin D, Cholecalciferol, 50 MCG (2000 UT) CAPS Take 1 capsule by mouth daily.     Current Facility-Administered Medications on File Prior to Visit  Medication Dose Route Frequency Provider Last Rate Last Dose  . levonorgestrel (MIRENA) 20 MCG/24HR IUD   Intrauterine Once [name], Timothy P,  MD        PAST MEDICAL HISTORY: Past Medical History:  Diagnosis Date  . Allergy   . Back pain   . CHF (congestive heart failure) ([place])   . Diabetes ([place])   . Dyspnea   . Gallbladder problem   . GERD (gastroesophageal reflux disease)   . Hip pain   . Hyperlipemia   . Hypertension   . Knee pain   . Leg edema   . Obesity   . Sleep apnea   . Venous insufficiency     PAST SURGICAL HISTORY: Past Surgical History:  Procedure Laterality Date  . Birth Elta [place] removed    . CHOLECYSTECTOMY    . INTRAUTERINE DEVICE INSERTION  12/31/2013   Mirena    SOCIAL HISTORY: Social History   Tobacco Use  . Smoking status: Never Smoker  . Smokeless tobacco: Never Used  Substance Use Topics  . Alcohol use: Yes     Alcohol/week: 0.0 standard drinks    Comment: Rare  . Drug use: No    FAMILY HISTORY: Family History  Problem Relation Age of Onset  . Hypertension Father   . Diabetes Father   . Obesity Father   . Cancer Paternal Grandfather        Stomach  . Cancer Maternal Uncle        Pancreatic  . Cancer Paternal Uncle        Kidney  . Hypertension Mother   . Anxiety disorder Mother    ROS: Review of Systems  Respiratory: Negative for shortness of breath.   Cardiovascular: Negative for chest pain.  Gastrointestinal: Negative for nausea and vomiting.  Musculoskeletal:       Negative for muscle weakness.  Endo/Heme/Allergies:       Negative for polyphagia.   PHYSICAL EXAM: Blood pressure 124/74, pulse (!) 56, temperature 97.6 F (36.4 C), temperature source Oral, height _0  (1.651 m), weight 286 lb (129.7 kg), SpO2 100 %. Body mass index is 47.59 kg/m. Physical Exam Vitals signs reviewed.  Constitutional:      Appearance: Normal appearance. She is obese.  Cardiovascular:     Rate and Rhythm: Normal rate.     Pulses: Normal pulses.  Pulmonary:     Effort: Pulmonary effort is normal.     Breath sounds: Normal breath sounds.  Musculoskeletal: Normal range of motion.  Skin:    General: Skin is warm and dry.  Neurological:     Mental Status: She is alert and oriented to person, place, and time.  Psychiatric:        Behavior: Behavior normal.   RECENT LABS AND TESTS: BMET    Component Value Date/Time   NA 140 02/12/2018 1315   K 3.7 02/12/2018 1315   CL 99 02/12/2018 1315   CO2 25 02/12/2018 1315   GLUCOSE 91 02/12/2018 1315   GLUCOSE 115 (H) 04/10/2017 1656   BUN 15 02/12/2018 1315   CREATININE 0.56 (L) 02/12/2018 1315   CALCIUM 9.6 02/12/2018 1315   GFRNONAA 108 02/12/2018 1315   GFRAA 124 02/12/2018 1315   Lab Results  Component Value Date   HGBA1C 6.0 (H) 02/12/2018   HGBA1C 7.2 (H) 10/20/2017   HGBA1C 7.3 (H) 07/14/2017   HGBA1C 7.7 (H) 04/03/2017    HGBA1C 7.4 (H) 09/13/2016   Lab Results  Component Value Date   INSULIN 8.9 02/12/2018   INSULIN 9.1 10/20/2017   CBC    Component Value Date/Time   WBC 7.9 10/20/2017 0940  WBC 10.6 (H) 04/05/2017 0443   RBC 5.35 (H) 10/20/2017 0940   RBC 5.16 (H) 04/05/2017 0443   HGB 15.0 10/20/2017 0940   HCT 48.0 (H) 10/20/2017 0940   PLT 265 04/05/2017 0443   MCV 90 10/20/2017 0940   MCH 28.0 10/20/2017 0940   MCH 27.3 04/05/2017 0443   MCHC 31.3 (L) 10/20/2017 0940   MCHC 29.4 (L) 04/05/2017 0443   RDW 16.0 (H) 10/20/2017 0940   LYMPHSABS 1.2 10/20/2017 0940   MONOABS 0.7 04/05/2017 0443   EOSABS 0.1 10/20/2017 0940   BASOSABS 0.1 10/20/2017 0940   Iron/TIBC/Ferritin/ %Sat No results found for: IRON, TIBC, FERRITIN, IRONPCTSAT Lipid Panel     Component Value Date/Time   CHOL 127 10/20/2017 0940   TRIG 91 10/20/2017 0940   HDL 47 10/20/2017 0940   CHOLHDL 3 09/13/2016 0958   VLDL 21.8 09/13/2016 0958   LDLCALC 62 10/20/2017 0940   Hepatic Function Panel     Component Value Date/Time   PROT 7.5 02/12/2018 1315   ALBUMIN 4.5 02/12/2018 1315   AST 30 02/12/2018 1315   ALT 65 (H) 02/12/2018 1315   ALKPHOS 161 (H) 02/12/2018 1315   BILITOT 0.7 02/12/2018 1315      Component Value Date/Time   TSH 2.790 10/20/2017 0940   Results for JALEI, SHIBLEY (MRN [id number]) as of 08/11/2018 07:12  Ref. Range 02/12/2018 13:15  Vitamin D, 25-Hydroxy Latest Ref Range: 30.0 - 100.0 ng/mL 52.4   OBESITY BEHAVIORAL INTERVENTION VISIT  Today's visit was #16  Starting weight: 378 lbs Starting date: 10/20/2017 Today's weight: 286 lbs  Today's date: 08/10/2018 Total lbs lost to date: 92    08/10/2018  Height _0  (1.651 m)  Weight 286 lb (129.7 kg)  BMI (Calculated) 47.59  BLOOD PRESSURE - SYSTOLIC 675  BLOOD PRESSURE - DIASTOLIC 74   Body Fat % 44.9 %   ASK: We discussed the diagnosis of obesity with [name] today and [name] agreed to give [place] permission to  discuss obesity behavioral modification therapy today.  ASSESS: [name] has the diagnosis of obesity and her BMI today is 47.7. [name] is in the action stage of change.   ADVISE: [name] was educated on the multiple health risks of obesity as well as the benefit of weight loss to improve her health. She was advised of the need for long term treatment and the importance of lifestyle modifications to improve her current health and to decrease her risk of future health problems.  AGREE: Multiple dietary modification options and treatment options were discussed and  [name] agreed to follow the recommendations documented in the above note.  ARRANGE: [name] was educated on the importance of frequent visits to treat obesity as outlined per CMS and USPSTF guidelines and agreed to schedule her next follow up appointment today.  IMichaelene Song, am acting as Location manager for [employer], FNP  I have reviewed the above documentation for accuracy and completeness, and I agree with the above.  - Esme Durkin, FNP-C.

## 2018-08-12 ENCOUNTER — Ambulatory Visit: Payer: BC Managed Care – PPO | Admitting: Family Medicine

## 2018-08-12 ENCOUNTER — Other Ambulatory Visit: Payer: Self-pay

## 2018-08-12 ENCOUNTER — Encounter: Payer: Self-pay | Admitting: Family Medicine

## 2018-08-12 VITALS — BP 112/68 | HR 58 | Temp 97.7°F | Wt 289.0 lb

## 2018-08-12 DIAGNOSIS — E119 Type 2 diabetes mellitus without complications: Secondary | ICD-10-CM | POA: Diagnosis not present

## 2018-08-12 DIAGNOSIS — I10 Essential (primary) hypertension: Secondary | ICD-10-CM | POA: Diagnosis not present

## 2018-08-12 DIAGNOSIS — I872 Venous insufficiency (chronic) (peripheral): Secondary | ICD-10-CM | POA: Diagnosis not present

## 2018-08-12 DIAGNOSIS — I509 Heart failure, unspecified: Secondary | ICD-10-CM | POA: Diagnosis not present

## 2018-08-12 NOTE — Patient Instructions (Signed)
Chronic Venous Insufficiency Chronic venous insufficiency is a condition where the leg veins cannot effectively pump blood from the legs to the heart. This happens when the vein walls are either stretched, weakened, or damaged, or when the valves inside the vein are damaged. With the right treatment, you should be able to continue with an active life. This condition is also called venous stasis. What are the causes? Common causes of this condition include:  High blood pressure inside the veins (venous hypertension).  Sitting or standing too long, causing increased blood pressure in the leg veins.  A blood clot that blocks blood flow in a vein (deep vein thrombosis, DVT).  Inflammation of a vein (phlebitis) that causes a blood clot to form.  Tumors in the pelvis that cause blood to back up. What increases the risk? The following factors may make you more likely to develop this condition:  Having a family history of this condition.  Obesity.  Pregnancy.  Living without enough regular physical activity or exercise (sedentary lifestyle).  Smoking.  Having a job that requires long periods of standing or sitting in one place.  Being a certain age. Women in their 14s and 18s and men in their 87s are more likely to develop this condition. What are the signs or symptoms? Symptoms of this condition include:  Veins that are enlarged, bulging, or twisted (varicose veins).  Skin breakdown or ulcers.  Reddened skin or dark discoloration of skin on the leg between the knee and ankle.  Brown, smooth, tight, and painful skin just above the ankle, usually on the inside of the leg (lipodermatosclerosis).  Swelling of the legs. How is this diagnosed? This condition may be diagnosed based on:  Your medical history.  A physical exam.  Tests, such as: ? A procedure that creates an image of a blood vessel and nearby organs and provides information about blood flow through the blood vessel  (duplex ultrasound). ? A procedure that tests blood flow (plethysmography). ? A procedure that looks at the veins using X-ray and dye (venogram). How is this treated? The goals of treatment are to help you return to an active life and to minimize pain or disability. Treatment depends on the severity of your condition, and it may include:  Wearing compression stockings. These can help relieve symptoms and help prevent your condition from getting worse. However, they do not cure the condition.  Sclerotherapy. This procedure involves an injection of a solution that shrinks damaged veins.  Surgery. This may involve: ? Removing a diseased vein (vein stripping). ? Cutting off blood flow through the vein (laser ablation surgery). ? Repairing or reconstructing a valve within the affected vein. Follow these instructions at home:      Wear compression stockings as told by your health care provider. These stockings help to prevent blood clots and reduce swelling in your legs.  Take over-the-counter and prescription medicines only as told by your health care provider.  Stay active by exercising, walking, or doing different activities. Ask your health care provider what activities are safe for you and how much exercise you need.  Drink enough fluid to keep your urine pale yellow.  Do not use any products that contain nicotine or tobacco, such as cigarettes, e-cigarettes, and chewing tobacco. If you need help quitting, ask your health care provider.  Keep all follow-up visits as told by your health care provider. This is important. Contact a health care provider if you:  Have redness, swelling, or more pain  in the affected area.  See a red streak or line that goes up or down from the affected area.  Have skin breakdown or skin loss in the affected area, even if the breakdown is small.  Get an injury in the affected area. Get help right away if:  You get an injury and an open wound in the  affected area.  You have: ? Severe pain that does not get better with medicine. ? Sudden numbness or weakness in the foot or ankle below the affected area. ? Trouble moving your foot or ankle. ? A fever. ? Worse or persistent symptoms. ? Chest pain. ? Shortness of breath. Summary  Chronic venous insufficiency is a condition where the leg veins cannot effectively pump blood from the legs to the heart.  Chronic venous insufficiency occurs when the vein walls become stretched, weakened, or damaged, or when valves within the vein are damaged.  Treatment depends on how severe your condition is. It often involves wearing compression stockings and may involve having a procedure.  Make sure you stay active by exercising, walking, or doing different activities. Ask your health care provider what activities are safe for you and how much exercise you need. This information is not intended to replace advice given to you by your health care provider. Make sure you discuss any questions you have with your health care provider. Document Released: 06/03/2006 Document Revised: 10/21/2017 Document Reviewed: 10/21/2017 Elsevier Patient Education  2020 [employer].  Managing Your Hypertension Hypertension is commonly called high blood pressure. This is when the force of your blood pressing against the walls of your arteries is too strong. Arteries are blood vessels that carry blood from your heart throughout your body. Hypertension forces the heart to work harder to pump blood, and may cause the arteries to become narrow or stiff. Having untreated or uncontrolled hypertension can cause heart attack, stroke, kidney disease, and other problems. What are blood pressure readings? A blood pressure reading consists of a higher number over a lower number. Ideally, your blood pressure should be below 120/80. The first ("top") number is called the systolic pressure. It is a measure of the pressure in your arteries as  your heart beats. The second ("bottom") number is called the diastolic pressure. It is a measure of the pressure in your arteries as the heart relaxes. What does my blood pressure reading mean? Blood pressure is classified into four stages. Based on your blood pressure reading, your health care provider may use the following stages to determine what type of treatment you need, if any. Systolic pressure and diastolic pressure are measured in a unit called mm Hg. Normal  Systolic pressure: below 834.  Diastolic pressure: below 80. Elevated  Systolic pressure: 196-222.  Diastolic pressure: below 80. Hypertension stage 1  Systolic pressure: 979-892.  Diastolic pressure: 11-94. Hypertension stage 2  Systolic pressure: 174 or above.  Diastolic pressure: 90 or above. What health risks are associated with hypertension? Managing your hypertension is an important responsibility. Uncontrolled hypertension can lead to:  A heart attack.  A stroke.  A weakened blood vessel (aneurysm).  Heart failure.  Kidney damage.  Eye damage.  Metabolic syndrome.  Memory and concentration problems. What changes can I make to manage my hypertension? Hypertension can be managed by making lifestyle changes and possibly by taking medicines. Your health care provider will help you make a plan to bring your blood pressure within a normal range. Eating and drinking   Eat a diet  that is high in fiber and potassium, and low in salt (sodium), added sugar, and fat. An example eating plan is called the DASH (Dietary Approaches to Stop Hypertension) diet. To eat this way: ? Eat plenty of fresh fruits and vegetables. Try to fill half of your plate at each meal with fruits and vegetables. ? Eat whole grains, such as whole wheat pasta, brown rice, or whole grain bread. Fill about one quarter of your plate with whole grains. ? Eat low-fat diary products. ? Avoid fatty cuts of meat, processed or cured meats, and  poultry with skin. Fill about one quarter of your plate with lean proteins such as fish, chicken without skin, beans, eggs, and tofu. ? Avoid premade and processed foods. These tend to be higher in sodium, added sugar, and fat.  Reduce your daily sodium intake. Most people with hypertension should eat less than 1,500 mg of sodium a day.  Limit alcohol intake to no more than 1 drink a day for nonpregnant women and 2 drinks a day for men. One drink equals 12 oz of beer, 5 oz of wine, or 1 oz of hard liquor. Lifestyle  Work with your health care provider to maintain a healthy body weight, or to lose weight. Ask what an ideal weight is for you.  Get at least 30 minutes of exercise that causes your heart to beat faster (aerobic exercise) most days of the week. Activities may include walking, swimming, or biking.  Include exercise to strengthen your muscles (resistance exercise), such as weight lifting, as part of your weekly exercise routine. Try to do these types of exercises for 30 minutes at least 3 days a week.  Do not use any products that contain nicotine or tobacco, such as cigarettes and e-cigarettes. If you need help quitting, ask your health care provider.  Control any long-term (chronic) conditions you have, such as high cholesterol or diabetes. Monitoring  Monitor your blood pressure at home as told by your health care provider. Your personal target blood pressure may vary depending on your medical conditions, your age, and other factors.  Have your blood pressure checked regularly, as often as told by your health care provider. Working with your health care provider  Review all the medicines you take with your health care provider because there may be side effects or interactions.  Talk with your health care provider about your diet, exercise habits, and other lifestyle factors that may be contributing to hypertension.  Visit your health care provider regularly. Your health care  provider can help you create and adjust your plan for managing hypertension. Will I need medicine to control my blood pressure? Your health care provider may prescribe medicine if lifestyle changes are not enough to get your blood pressure under control, and if:  Your systolic blood pressure is 130 or higher.  Your diastolic blood pressure is 80 or higher. Take medicines only as told by your health care provider. Follow the directions carefully. Blood pressure medicines must be taken as prescribed. The medicine does not work as well when you skip doses. Skipping doses also puts you at risk for problems. Contact a health care provider if:  You think you are having a reaction to medicines you have taken.  You have repeated (recurrent) headaches.  You feel dizzy.  You have swelling in your ankles.  You have trouble with your vision. Get help right away if:  You develop a severe headache or confusion.  You have unusual weakness or  numbness, or you feel faint.  You have severe pain in your chest or abdomen.  You vomit repeatedly.  You have trouble breathing. Summary  Hypertension is when the force of blood pumping through your arteries is too strong. If this condition is not controlled, it may put you at risk for serious complications.  Your personal target blood pressure may vary depending on your medical conditions, your age, and other factors. For most people, a normal blood pressure is less than 120/80.  Hypertension is managed by lifestyle changes, medicines, or both. Lifestyle changes include weight loss, eating a healthy, low-sodium diet, exercising more, and limiting alcohol. This information is not intended to replace advice given to you by your health care provider. Make sure you discuss any questions you have with your health care provider. Document Released: 10/23/2011 Document Revised: 05/22/2018 Document Reviewed: 12/27/2015 Elsevier Patient Education  2020 Elsevier  Inc[employer]  Preventing Diabetes Mellitus Complications You can take action to prevent or slow down problems that are caused by diabetes (diabetes mellitus). Following your diabetes plan and taking care of yourself can reduce your risk of serious or life-threatening complications. What actions can I take to prevent diabetes complications? Manage your diabetes   Follow instructions from your health care providers about managing your diabetes. Your diabetes may be managed by a team of health care providers who can teach you how to care for yourself and can answer questions that you have.  Educate yourself about your condition so you can make healthy choices about eating and physical activity.  Check your blood sugar (glucose) levels as often as directed. Your health care provider will help you decide how often to check your blood glucose level depending on your treatment goals and how well you are meeting them.  Ask your health care provider if you should take low-dose aspirin daily and what dose is recommended for you. Taking low-dose aspirin daily is recommended to help prevent cardiovascular disease. Do not use nicotine or tobacco Do not use any products that contain nicotine or tobacco, such as cigarettes and e-cigarettes. If you need help quitting, ask your health care provider. Nicotine raises your risk for diabetes problems. If you quit using nicotine:  You will lower your risk for heart attack, stroke, nerve disease, and kidney disease.  Your cholesterol and blood pressure may improve.  Your blood circulation will improve. Keep your blood pressure under control Your personal target blood pressure is determined based on:  Your age.  Your medicines.  How long you have had diabetes.  Any other medical conditions you have. To control your blood pressure:  Follow instructions from your health care provider about meal planning, exercise, and medicines.  Make sure your health care  provider checks your blood pressure at every medical visit.  Monitor your blood pressure at home as told by your health care provider.  Keep your cholesterol under control To control your cholesterol:  Follow instructions from your health care provider about meal planning, exercise, and medicines.  Have your cholesterol checked at least once a year.  You may be prescribed medicine to lower cholesterol (statin). If you are not taking a statin, ask your health care provider if you should be. Controlling your cholesterol may:  Help prevent heart disease and stroke. These are the most common health problems for people with diabetes.  Improve your blood flow. Schedule and keep yearly physical exams and eye exams Your health care provider will tell you how often you need medical visits  depending on your diabetes management plan. Keep all follow-up visits as directed. This is important so possible problems can be identified early and complications can be avoided or treated.  Every visit with your health care provider should include measuring your: ? Weight. ? Blood pressure. ? Blood glucose control.  Your A1c (hemoglobin A1c) level should be checked: ? At least 2 times a year, if you are meeting your treatment goals. ? 4 times a year, if you are not meeting treatment goals or if your treatment goals have changed.  Your blood lipids (lipid profile) should be checked yearly. You should also be checked yearly for protein in your urine (urine microalbumin).  If you have type 1 diabetes, get an eye exam 3-5 years after you are diagnosed, and then once a year after your first exam.  If you have type 2 diabetes, get an eye exam as soon as you are diagnosed, and then once a year after your first exam. Keep your vaccines current It is recommended that you receive:  A flu (influenza) vaccine every year.  A pneumonia (pneumococcal) vaccine and a hepatitis B vaccine. If you are age 53 or older,  you may get the pneumonia vaccine as a series of two separate shots. Ask your health care provider which other vaccines may be recommended. Take care of your feet Diabetes may cause you to have poor blood circulation to your legs and feet. Because of this, taking care of your feet is very important. Diabetes can cause:  The skin on the feet to get thinner, break more easily, and heal more slowly.  Nerve damage in your legs and feet, which results in decreased feeling. You may not notice minor injuries that could lead to serious problems. To avoid foot problems:  Check your skin and feet every day for cuts, bruises, redness, blisters, or sores.  Schedule a foot exam with your health care provider once every year. This exam includes: ? Inspecting of the structure and skin of your feet. ? Checking the pulses and sensation in your feet.  Make sure that your health care provider performs a visual foot exam at every medical visit.  Take care of your teeth People with poorly controlled diabetes are more likely to have gum (periodontal) disease. Diabetes can make periodontal diseases harder to control. If not treated, periodontal diseases can lead to tooth loss. To prevent this:  Brush your teeth twice a day.  Floss at least once a day.  Visit your dentist 2 times a year. Drink responsibly Limit alcohol intake to no more than 1 drink a day for nonpregnant women and 2 drinks a day for men. One drink equals 12 oz of beer, 5 oz of wine, or 1 oz of hard liquor.  It is important to eat food when you drink alcohol to avoid low blood glucose (hypoglycemia). Avoid alcohol if you:  Have a history of alcohol abuse or dependence.  Are pregnant.  Have liver disease, pancreatitis, advanced neuropathy, or severe hypertriglyceridemia. Lessen stress Living with diabetes can be stressful. When you are experiencing stress, your blood glucose may be affected in two ways:  Stress hormones may cause your  blood glucose to rise.  You may be distracted from taking good care of yourself. Be aware of your stress level and make changes to help you manage challenging situations. To lower your stress levels:  Consider joining a support group.  Do planned relaxation or meditation.  Do a hobby that you enjoy.  Maintain healthy relationships.  Exercise regularly.  Work with your health care provider or a mental health professional. Summary  You can take action to prevent or slow down problems that are caused by diabetes (diabetes mellitus). Following your diabetes plan and taking care of yourself can reduce your risk of serious or life-threatening complications.  Follow instructions from your health care providers about managing your diabetes. Your diabetes may be managed by a team of health care providers who can teach you how to care for yourself and can answer questions that you have.  Your health care provider will tell you how often you need medical visits depending on your diabetes management plan. Keep all follow-up visits as directed. This is important so possible problems can be identified early and complications can be avoided or treated. This information is not intended to replace advice given to you by your health care provider. Make sure you discuss any questions you have with your health care provider. Document Released: 10/16/2010 Document Revised: 04/28/2017 Document Reviewed: 10/28/2015 Elsevier Patient Education  2020 [employer]

## 2018-08-12 NOTE — Progress Notes (Signed)
Subjective:    Patient ID: [name], female    DOB: 10/23/1965, 53 y.o.   MRN: [id number]  No chief complaint on file.   HPI Patient was seen today for f/u and TOC, previously seen by Dr. Selena BattenKim.  HTN: -Occasionally checking BP at home, typically 120s/60-70s -Taking losartan 100 mg, metoprolol succinate 50 mg -Denies headaches, changes in vision, chest pain -Limiting sodium intake  DMII -FSBS 90-100 -Denies hypoglycemic episodes -Taking metformin 1000 mg  CHF: -Limiting sodium intake -Has follow-up with cardiology in a few months -States not really sure if she has CHF as it only happened 1 time -Patient denies weight gain, shortness of breath, chest pain, current lower extremity edema -Typically wears TED hose -when has LE edema, R leg>L leg.  rash on R shin -notes present x yrs -discoloration initially noted after a sports injury. -denies itching, pain, drainage.   Ob[facility]: -[facility]  Seen every 3 weeks -Walking for exercise 50 minutes 4-5 times per week -had labs drawn at last [facility] -trying to eat low carb, more vegetables.  Past Medical History:  Diagnosis Date  . Allergy   . Back pain   . CHF (congestive heart failure) (HCC)   . Diabetes (HCC)   . Dyspnea   . Gallbladder problem   . GERD (gastroesophageal reflux disease)   . Hip pain   . Hyperlipemia   . Hypertension   . Knee pain   . Leg edema   . Obesity   . Sleep apnea   . Venous insufficiency     No Known Allergies  ROS General: Denies fever, chills, night sweats, changes in weight, changes in appetite HEENT: Denies headaches, ear pain, changes in vision, rhinorrhea, sore throat CV: Denies CP, palpitations, SOB, orthopnea Pulm: Denies SOB, cough, wheezing GI: Denies abdominal pain, nausea, vomiting, diarrhea, constipation GU: Denies dysuria, hematuria, frequency, vaginal discharge Msk: Denies muscle cramps, joint pains Neuro: Denies weakness, numbness, tingling Skin: Denies  bruising  +rash on shin Psych: Denies depression, anxiety, hallucinations     Objective:    Blood pressure 112/68, pulse (!) 58, temperature 97.7 F (36.5 C), weight 289 lb (131.1 kg), SpO2 99 %.   Gen. Pleasant, well-nourished, in no distress, normal affect  HEENT: [place]/AT, face symmetric, conjunctiva clear, no scleral icterus, PERRLA, EOMI, nares patent without drainage, pharynx without erythema or exudate. Lungs: no accessory muscle use, CTAB, no wheezes or rales Cardiovascular: RRR, no m/r/g, trace peripheral edema at R shin.  No edema in LLE. Abdomen: BS present, soft, NT/ND Musculoskeletal: No deformities, no cyanosis or clubbing, normal tone Neuro:  A&Ox3, CN II-XII intact, normal gait Skin:  Warm, no lesions/ rash.  Hyperpigmentation of R shin, a few varicose veins noted.   Wt Readings from Last 3 Encounters:  08/10/18 286 lb (129.7 kg)  04/22/18 295 lb (133.8 kg)  03/17/18 (!) 303 lb (137.4 kg)    Lab Results  Component Value Date   WBC 7.9 10/20/2017   HGB 15.0 10/20/2017   HCT 48.0 (H) 10/20/2017   PLT 265 04/05/2017   GLUCOSE 86 08/10/2018   CHOL 127 10/20/2017   TRIG 91 10/20/2017   HDL 47 10/20/2017   LDLCALC 62 10/20/2017   ALT 37 (H) 08/10/2018   AST 22 08/10/2018   NA 144 08/10/2018   K 4.0 08/10/2018   CL 104 08/10/2018   CREATININE 0.54 (L) 08/10/2018   BUN 23 08/10/2018   CO2 25 08/10/2018   TSH 2.790 10/20/2017  HGBA1C 5.4 08/10/2018   MICROALBUR <0.7 04/12/2016    Assessment/Plan:  Essential hypertension  -controlled -losartan 100 mg, metoprolol succinate 50 mg -continue to check bp at home -continue lifestyle modifications  Congestive heart failure, unspecified HF chronicity, unspecified heart failure type ([place]) -stable -continue lifestyle modifications including limiting sodium -continue daily weights -continue current meds: metoprolol succinate 50 mg, lasix 40 mg daily  Chronic venous insufficiency  -chronic venous stasis changes  noted on R shin -continue TED hose and elevating LEs when sitting. -given handout  Type 2 diabetes mellitus without complication, without long-term current use of insulin ([place])  -controlled.  Last hgb A1C 5.4% on 08/10/18 -continue current meds: Metformin 1000 mg BID -continue checking fsbs.  Consider decreasing Metformin dose given pt's weight loss and A1C  F/u prn in the next 4-6 months, sooner if needed.  Grier Mitts, MD

## 2018-08-16 ENCOUNTER — Other Ambulatory Visit: Payer: Self-pay | Admitting: Family Medicine

## 2018-09-01 ENCOUNTER — Other Ambulatory Visit: Payer: Self-pay

## 2018-09-01 ENCOUNTER — Ambulatory Visit (INDEPENDENT_AMBULATORY_CARE_PROVIDER_SITE_OTHER): Payer: BC Managed Care – PPO | Admitting: Physician Assistant

## 2018-09-01 ENCOUNTER — Encounter (INDEPENDENT_AMBULATORY_CARE_PROVIDER_SITE_OTHER): Payer: Self-pay | Admitting: Physician Assistant

## 2018-09-01 VITALS — BP 107/67 | HR 53 | Temp 98.0°F | Ht 65.0 in | Wt 286.0 lb

## 2018-09-01 DIAGNOSIS — E119 Type 2 diabetes mellitus without complications: Secondary | ICD-10-CM | POA: Diagnosis not present

## 2018-09-01 DIAGNOSIS — I10 Essential (primary) hypertension: Secondary | ICD-10-CM | POA: Diagnosis not present

## 2018-09-01 DIAGNOSIS — Z9189 Other specified personal risk factors, not elsewhere classified: Secondary | ICD-10-CM

## 2018-09-01 DIAGNOSIS — Z6841 Body Mass Index (BMI) 40.0 and over, adult: Secondary | ICD-10-CM

## 2018-09-01 MED ORDER — LOSARTAN POTASSIUM 100 MG PO TABS: 100.0000 mg | ORAL_TABLET | Freq: Every day | ORAL | 0 refills | Status: DC

## 2018-09-01 MED ORDER — ONETOUCH DELICA LANCETS 33G MISC: 0 refills | Status: DC

## 2018-09-01 MED ORDER — GLUCOSE BLOOD VI STRP: ORAL_STRIP | 0 refills | Status: DC

## 2018-09-02 NOTE — Progress Notes (Signed)
Office: [phone]  /  Fax: [phone]   HPI:   Chief Complaint: OBESITY [name] is here to discuss her progress with her obesity treatment plan. She is on lower carbohydrate, vegetable and lean protein rich diet plan and is following her eating plan approximately 50-60% of the time. She states she is walking 45-50 minutes 5 times per week. [name] reports that she did not follow the low carb plan until last week due to birthday and holiday celebrations. She only followed it 50% this past week and did not like it. Her weight is 286 lb (129.7 kg) today and has not lost weight since her last visit. She has lost 92 lbs since starting treatment with [name].  Diabetes II [name] has a diagnosis of diabetes type II and is on metformin. [name] states fasting blood sugars range between 90 and 117. Last A1c was 5.4 on 08/10/2018. She has been working on intensive lifestyle modifications including diet, exercise, and weight loss to help control her blood glucose levels. No nausea, vomiting, diarrhea, or hypoglycemia.  Hypertension [name] is a 53 y.o. female with hypertension. [name] denies chest pain or headache. She is working weight loss to help control her blood pressure with the goal of decreasing her risk of heart attack and stroke. Graciela's blood pressure is normal.  At risk for cardiovascular disease [name] is at a higher than average risk for cardiovascular disease due to obesity. She currently denies any chest pain.  ASSESSMENT AND PLAN:  Type 2 diabetes mellitus without complication, without long-term current use of insulin (HCC) - Plan: OneTouch Delica Lancets 39J MISC, glucose blood (ONE TOUCH ULTRA TEST) test strip  Essential hypertension - Plan: losartan (COZAAR) 100 MG tablet  At risk for heart disease  Class 3 severe obesity with serious comorbidity and body mass index (BMI) of 45.0 to 49.9 in adult, unspecified obesity type  ([name])  PLAN:  Diabetes II [name] has been given extensive diabetes education by myself today including ideal fasting and post-prandial blood glucose readings, individual ideal Hgb A1c goals  and hypoglycemia prevention. We discussed the importance of good blood sugar control to decrease the likelihood of diabetic complications such as nephropathy, neuropathy, limb loss, blindness, coronary artery disease, and death. We discussed the importance of intensive lifestyle modification including diet, exercise and weight loss as the first line treatment for diabetes. [name] was given a refill on her lancets #100 and a refill on her test strips #100 with 0 refills. She agrees to follow-up with our clinic in 3 weeks.  Hypertension We discussed sodium restriction, working on healthy weight loss, and a regular exercise program as the means to achieve improved blood pressure control. [name] agreed with this plan and agreed to follow up as directed. We will continue to monitor her blood pressure as well as her progress with the above lifestyle modifications. [name] was given a refill on her losartan #90 with no refills and agrees to follow-up with our clinic in 3 weeks. She will watch for signs of hypotension as she continues her lifestyle modifications.  Cardiovascular risk counseling [name] was given extended (15 minutes) coronary artery disease prevention counseling today. She is 53 y.o. female and has risk factors for heart disease including obesity. We discussed intensive lifestyle modifications today with an emphasis on specific weight loss instructions and strategies. Pt was also informed of the importance of increasing exercise and decreasing saturated fats to help prevent heart disease.  Obesity [name] is currently in the  action stage of change. As such, her goal is to continue with weight loss efforts. She has agreed to follow the Category 3 plan. [name] has been instructed to work up to a goal  of 150 minutes of combined cardio and strengthening exercise per week for weight loss and overall health benefits. We discussed the following Behavioral Modification Strategies today: work on meal planning and easy cooking plans, and planning for success.  [name] has agreed to follow-up with our clinic in 3 weeks. She was informed of the importance of frequent follow-up visits to maximize her success with intensive lifestyle modifications for her multiple health conditions.  ALLERGIES: No Known Allergies  MEDICATIONS: Current Outpatient Medications on File Prior to Visit  Medication Sig Dispense Refill   Ascorbic Acid (VITAMIN C PO) Take by mouth.     atorvastatin (LIPITOR) 20 MG tablet TAKE 1 TABLET BY MOUTH EVERY DAY 90 tablet 3   blood glucose meter kit and supplies Dispense based on patient and insurance preference. Use up to twice daily as directed. (FOR ICD-10 E10.9, E11.9). 1 each 0   [facility] ASPIRIN LOW STRENGTH 81 MG EC tablet TAKE 1 TABLET BY MOUTH EVERY DAY 90 tablet 1   furosemide (LASIX) 40 MG tablet TAKE 1 TABLET BY MOUTH EVERY DAY 90 tablet 1   ibuprofen (ADVIL,MOTRIN) 200 MG tablet Take 200 mg by mouth every 6 (six) hours as needed.     levonorgestrel (MIRENA) 20 MCG/24HR IUD 1 each by Intrauterine route once.     metFORMIN (GLUCOPHAGE) 1000 MG tablet TAKE 1 TABLET (1,000 MG TOTAL) BY MOUTH 2 (TWO) TIMES DAILY WITH A MEAL. 180 tablet 1   metoprolol succinate (TOPROL-XL) 50 MG 24 hr tablet Take 1 tablet (50 mg total) by mouth daily. Take with or immediately following a meal. 30 tablet 0   Polyethyl Glycol-Propyl Glycol (SYSTANE) 0.4-0.3 % SOLN Apply to eye.     Pseudoephedrine-Acetaminophen (ALKA-SELTZER PLUS COLD/SINUS PO) Take by mouth.     Vitamin D, Cholecalciferol, 50 MCG (2000 UT) CAPS Take 1 capsule by mouth daily.     Current Facility-Administered Medications on File Prior to Visit  Medication Dose Route Frequency Provider Last Rate Last Dose    levonorgestrel (MIRENA) 20 MCG/24HR IUD   Intrauterine Once [name], [name], MD        PAST MEDICAL HISTORY: Past Medical History:  Diagnosis Date   Allergy    Back pain    CHF (congestive heart failure) (HCC)    Diabetes ([place])    Dyspnea    Gallbladder problem    GERD (gastroesophageal reflux disease)    Hip pain    Hyperlipemia    Hypertension    Knee pain    Leg edema    Obesity    Sleep apnea    Venous insufficiency     PAST SURGICAL HISTORY: Past Surgical History:  Procedure Laterality Date   Birth Mark removed     CHOLECYSTECTOMY     INTRAUTERINE DEVICE INSERTION  12/31/2013   Mirena    SOCIAL HISTORY: Social History   Tobacco Use   Smoking status: Never Smoker   Smokeless tobacco: Never Used  Substance Use Topics   Alcohol use: Yes    Alcohol/week: 0.0 standard drinks    Comment: Rare   Drug use: No    FAMILY HISTORY: Family History  Problem Relation Age of Onset   Hypertension Father    Diabetes Father    Obesity Father    Cancer Paternal Grandfather  Stomach   Cancer Maternal Uncle        Pancreatic   Cancer Paternal Uncle        Kidney   Hypertension Mother    Anxiety disorder Mother    ROS: Review of Systems  Cardiovascular: Negative for chest pain.  Gastrointestinal: Negative for diarrhea, nausea and vomiting.  Neurological: Negative for headaches.  Endo/Heme/Allergies:       Negative for hypoglycemia.   PHYSICAL EXAM: Blood pressure 107/67, pulse (!) 53, temperature 98 F (36.7 C), height '5\' 5"'  (1.651 m), weight 286 lb (129.7 kg), SpO2 99 %. Body mass index is 47.59 kg/m. Physical Exam Vitals signs reviewed.  Constitutional:      Appearance: Normal appearance. She is obese.  Cardiovascular:     Rate and Rhythm: Normal rate.     Pulses: Normal pulses.  Pulmonary:     Effort: Pulmonary effort is normal.     Breath sounds: Normal breath sounds.  Musculoskeletal: Normal range of motion.   Skin:    General: Skin is warm and dry.  Neurological:     Mental Status: She is alert and oriented to person, place, and time.  Psychiatric:        Behavior: Behavior normal.   RECENT LABS AND TESTS: BMET    Component Value Date/Time   NA 144 08/10/2018 1158   K 4.0 08/10/2018 1158   CL 104 08/10/2018 1158   CO2 25 08/10/2018 1158   GLUCOSE 86 08/10/2018 1158   GLUCOSE 115 (H) 04/10/2017 1656   BUN 23 08/10/2018 1158   CREATININE 0.54 (L) 08/10/2018 1158   CALCIUM 9.5 08/10/2018 1158   GFRNONAA 109 08/10/2018 1158   GFRAA 125 08/10/2018 1158   Lab Results  Component Value Date   HGBA1C 5.4 08/10/2018   HGBA1C 6.0 (H) 02/12/2018   HGBA1C 7.2 (H) 10/20/2017   HGBA1C 7.3 (H) 07/14/2017   HGBA1C 7.7 (H) 04/03/2017   Lab Results  Component Value Date   INSULIN 8.9 02/12/2018   INSULIN 9.1 10/20/2017   CBC    Component Value Date/Time   WBC 7.9 10/20/2017 0940   WBC 10.6 (H) 04/05/2017 0443   RBC 5.35 (H) 10/20/2017 0940   RBC 5.16 (H) 04/05/2017 0443   HGB 15.0 10/20/2017 0940   HCT 48.0 (H) 10/20/2017 0940   PLT 265 04/05/2017 0443   MCV 90 10/20/2017 0940   MCH 28.0 10/20/2017 0940   MCH 27.3 04/05/2017 0443   MCHC 31.3 (L) 10/20/2017 0940   MCHC 29.4 (L) 04/05/2017 0443   RDW 16.0 (H) 10/20/2017 0940   LYMPHSABS 1.2 10/20/2017 0940   MONOABS 0.7 04/05/2017 0443   EOSABS 0.1 10/20/2017 0940   BASOSABS 0.1 10/20/2017 0940   Iron/TIBC/Ferritin/ %Sat No results found for: IRON, TIBC, FERRITIN, IRONPCTSAT Lipid Panel     Component Value Date/Time   CHOL 127 10/20/2017 0940   TRIG 91 10/20/2017 0940   HDL 47 10/20/2017 0940   CHOLHDL 3 09/13/2016 0958   VLDL 21.8 09/13/2016 0958   LDLCALC 62 10/20/2017 0940   Hepatic Function Panel     Component Value Date/Time   PROT 7.2 08/10/2018 1158   ALBUMIN 4.6 08/10/2018 1158   AST 22 08/10/2018 1158   ALT 37 (H) 08/10/2018 1158   ALKPHOS 148 (H) 08/10/2018 1158   BILITOT 0.5 08/10/2018 1158       Component Value Date/Time   TSH 2.790 10/20/2017 0940   Results for HILLIARY, JOCK (MRN [id number]) as of 09/02/2018  08:41  Ref. Range 08/10/2018 11:58  Vitamin D, 25-Hydroxy Latest Ref Range: 30.0 - 100.0 ng/mL 58.9   OBESITY BEHAVIORAL INTERVENTION VISIT  Today's visit was #17  Starting weight: 378 lbs Starting date: 10/20/2017 Today's weight: 286 lbs  Today's date: 09/01/2018 Total lbs lost to date: 92   09/01/2018  Height '5\' 5"'  (1.651 m)  Weight 286 lb (129.7 kg)  BMI (Calculated) 47.59  BLOOD PRESSURE - SYSTOLIC 829  BLOOD PRESSURE - DIASTOLIC 67   Body Fat % 93.7 %   ASK: We discussed the diagnosis of obesity with [name] today and Jandy agreed to give [name] permission to discuss obesity behavioral modification therapy today.  ASSESS: Marcele has the diagnosis of obesity and her BMI today is 47.6. Jeanice is in the action stage of change.   ADVISE: Mieshia was educated on the multiple health risks of obesity as well as the benefit of weight loss to improve her health. She was advised of the need for long term treatment and the importance of lifestyle modifications to improve her current health and to decrease her risk of future health problems.  AGREE: Multiple dietary modification options and treatment options were discussed and  Zyanne agreed to follow the recommendations documented in the above note.  ARRANGE: Chanon was educated on the importance of frequent visits to treat obesity as outlined per CMS and USPSTF guidelines and agreed to schedule her next follow up appointment today.  Migdalia Dk, am acting as transcriptionist for Abby Potash, PA-C I, Abby Potash, PA-C have reviewed above note and agree with its content

## 2018-09-09 ENCOUNTER — Other Ambulatory Visit: Payer: Self-pay | Admitting: Family Medicine

## 2018-09-12 ENCOUNTER — Other Ambulatory Visit: Payer: Self-pay

## 2018-09-12 DIAGNOSIS — Z20822 Contact with and (suspected) exposure to covid-19: Secondary | ICD-10-CM

## 2018-09-14 LAB — NOVEL CORONAVIRUS, NAA: SARS-CoV-2, NAA: NOT DETECTED

## 2018-09-22 ENCOUNTER — Ambulatory Visit (INDEPENDENT_AMBULATORY_CARE_PROVIDER_SITE_OTHER): Payer: BC Managed Care – PPO | Admitting: Physician Assistant

## 2018-09-30 ENCOUNTER — Encounter (INDEPENDENT_AMBULATORY_CARE_PROVIDER_SITE_OTHER): Payer: Self-pay | Admitting: Family Medicine

## 2018-09-30 ENCOUNTER — Telehealth (INDEPENDENT_AMBULATORY_CARE_PROVIDER_SITE_OTHER): Payer: BC Managed Care – PPO | Admitting: Family Medicine

## 2018-09-30 ENCOUNTER — Other Ambulatory Visit: Payer: Self-pay

## 2018-09-30 DIAGNOSIS — Z6841 Body Mass Index (BMI) 40.0 and over, adult: Secondary | ICD-10-CM | POA: Diagnosis not present

## 2018-09-30 DIAGNOSIS — I10 Essential (primary) hypertension: Secondary | ICD-10-CM

## 2018-09-30 DIAGNOSIS — E119 Type 2 diabetes mellitus without complications: Secondary | ICD-10-CM | POA: Diagnosis not present

## 2018-09-30 MED ORDER — METOPROLOL SUCCINATE ER 50 MG PO TB24: 50.0000 mg | ORAL_TABLET | Freq: Every day | ORAL | 0 refills | Status: DC

## 2018-10-01 ENCOUNTER — Encounter (INDEPENDENT_AMBULATORY_CARE_PROVIDER_SITE_OTHER): Payer: Self-pay | Admitting: Family Medicine

## 2018-10-01 NOTE — Progress Notes (Signed)
Office: [phone]  /  Fax: [phone] TeleHealth Visit:  [name] has verbally consented to this TeleHealth visit today. The patient is located at home, the provider is located at the [facility] office. The participants in this visit include the listed provider and patient. The visit was conducted today via FaceTime.  HPI:   Chief Complaint: OBESITY [name] is here to discuss her progress with her obesity treatment plan. She is on the Category 4 plan and is following her eating plan approximately 50% of the time. She states she is exercising 0 minutes 0 times per week. [name] has gained 2 lbs and her weight is 292 lbs today. 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 She reports eating more carbs at home and the school has been providing food to the teachers. She is still trying to get all of her protein in.  We were unable to weigh the patient today for this TeleHealth visit. She feels as if she has gained 2 lbs since her last visit. She has lost 92 lbs since starting treatment with [place].  Hypertension [name] is a 53 y.o. female with hypertension, which has been well controlled on metoprolol and losartan.  [name] denies chest pain or shortness of breath on exertion. She is working weight loss to help control her blood pressure with the goal of decreasing her risk of heart attack and stroke. [name]'s blood pressure was 114/72 at home today. BP Readings from Last 3 Encounters:  09/01/18 107/67  08/12/18 112/68  08/10/18 124/74    Diabetes Mellitus [name] has a diagnosis of diabetes mellitus, well controlled on metformin. [name] states fasting blood sugars range between 90 and 110. Last A1c was 5.4 on 08/10/2018. She has been working on intensive lifestyle modifications including diet, exercise, and weight loss to help control her blood glucose levels. She  denies hypoglycemia. Lab Results  Component Value Date   HGBA1C 5.4 08/10/2018    ASSESSMENT AND PLAN:  Essential hypertension - Plan: metoprolol succinate (TOPROL-XL) 50 MG 24 hr tablet  Type 2 diabetes mellitus without complication, without long-term current use of insulin (HCC)  Class 3 severe obesity with serious comorbidity and body mass index (BMI) of 45.0 to 49.9 in adult, unspecified obesity type ([place])  PLAN:  Hypertension We discussed sodium restriction, working on healthy weight loss, and a regular exercise program as the means to achieve improved blood pressure control. [name] agreed with this plan and agreed to follow up as directed. We will continue to monitor her blood pressure as well as her progress with the above lifestyle modifications. [name] was given a refill on her metoprolol 50 mg daily #30 with 0 refills and agrees to follow-up with our clinic in 2 weeks. She will watch for signs of hypotension as she continues her lifestyle modifications.  Diabetes Mellitus [name] has been given extensive diabetes education by myself today including ideal fasting and post-prandial blood glucose readings, individual ideal Hgb A1c goals  and hypoglycemia prevention. We discussed the importance of good blood sugar control to decrease the likelihood of diabetic complications such as nephropathy, neuropathy, limb loss, blindness, coronary artery disease, and death. We discussed the importance of intensive lifestyle modification including diet, exercise and weight loss as the first line treatment for diabetes. [name] agrees to continue her metformin and meal plan. She will follow-up at the agreed upon time.  Obesity [name] is  currently in the action stage of change. As such, her goal is to continue with weight loss efforts. She has agreed to follow the Category 4 plan. [name] has been instructed to work up to a goal of 150 minutes of combined cardio and strengthening exercise per  week for weight loss and overall health benefits. We discussed the following Behavioral Modification Strategies today: increasing lean protein intake, decreasing simple carbohydrates, planning for success, and keep a strict food journal.  [name] has agreed to follow-up with our clinic in 2 weeks. She was informed of the importance of frequent follow-up visits to maximize her success with intensive lifestyle modifications for her multiple health conditions.  ALLERGIES: No Known Allergies  MEDICATIONS: Current Outpatient Medications on File Prior to Visit  Medication Sig Dispense Refill   Ascorbic Acid (VITAMIN C PO) Take by mouth.     atorvastatin (LIPITOR) 20 MG tablet TAKE 1 TABLET BY MOUTH EVERY DAY 90 tablet 3   blood glucose meter kit and supplies Dispense based on patient and insurance preference. Use up to twice daily as directed. (FOR ICD-10 E10.9, E11.9). 1 each 0   CVS ASPIRIN LOW STRENGTH 81 MG EC tablet TAKE 1 TABLET BY MOUTH EVERY DAY 90 tablet 1   furosemide (LASIX) 40 MG tablet TAKE 1 TABLET BY MOUTH EVERY DAY 90 tablet 1   glucose blood (ONE TOUCH ULTRA TEST) test strip USE TO TEST BLOOD GLUCOSE 2 TIMES DAILY AS DIRECTED 100 each 0   ibuprofen (ADVIL,MOTRIN) 200 MG tablet Take 200 mg by mouth every 6 (six) hours as needed.     levonorgestrel (MIRENA) 20 MCG/24HR IUD 1 each by Intrauterine route once.     losartan (COZAAR) 100 MG tablet Take 1 tablet (100 mg total) by mouth daily. 90 tablet 0   metFORMIN (GLUCOPHAGE) 1000 MG tablet TAKE 1 TABLET (1,000 MG TOTAL) BY MOUTH 2 (TWO) TIMES DAILY WITH A MEAL. 180 tablet 1   OneTouch Delica Lancets 94I MISC USE TO TEST BLOOD GLUCOSE UP TO 2 TIMES DAILY AS DIRECTED 100 each 0   Polyethyl Glycol-Propyl Glycol (SYSTANE) 0.4-0.3 % SOLN Apply to eye.     Pseudoephedrine-Acetaminophen (ALKA-SELTZER PLUS COLD/SINUS PO) Take by mouth.     Vitamin D, Cholecalciferol, 50 MCG (2000 UT) CAPS Take 1 capsule by mouth daily.      Current Facility-Administered Medications on File Prior to Visit  Medication Dose Route Frequency Provider Last Rate Last Dose   levonorgestrel (MIRENA) 20 MCG/24HR IUD   Intrauterine Once [name], [name], MD        PAST MEDICAL HISTORY: Past Medical History:  Diagnosis Date   Allergy    Back pain    CHF (congestive heart failure) (HCC)    Diabetes ([place])    Dyspnea    Gallbladder problem    GERD (gastroesophageal reflux disease)    Hip pain    Hyperlipemia    Hypertension    Knee pain    Leg edema    Obesity    Sleep apnea    Venous insufficiency     PAST SURGICAL HISTORY: Past Surgical History:  Procedure Laterality Date   Birth Mark removed     CHOLECYSTECTOMY     INTRAUTERINE DEVICE INSERTION  12/31/2013   Mirena    SOCIAL HISTORY: Social History   Tobacco Use   Smoking status: Never Smoker   Smokeless tobacco: Never Used  Substance Use Topics   Alcohol use: Yes    Alcohol/week: 0.0 standard drinks  Comment: Rare   Drug use: No    FAMILY HISTORY: Family History  Problem Relation Age of Onset   Hypertension Father    Diabetes Father    Obesity Father    Cancer Paternal Grandfather        Stomach   Cancer Maternal Uncle        Pancreatic   Cancer Paternal Uncle        Kidney   Hypertension Mother    Anxiety disorder Mother    ROS: Review of Systems  Respiratory: Negative for shortness of breath.   Cardiovascular: Negative for chest pain.  Endo/Heme/Allergies:       Negative for hypoglycemia.   PHYSICAL EXAM: Pt in no acute distress  RECENT LABS AND TESTS: BMET    Component Value Date/Time   NA 144 08/10/2018 1158   K 4.0 08/10/2018 1158   CL 104 08/10/2018 1158   CO2 25 08/10/2018 1158   GLUCOSE 86 08/10/2018 1158   GLUCOSE 115 (H) 04/10/2017 1656   BUN 23 08/10/2018 1158   CREATININE 0.54 (L) 08/10/2018 1158   CALCIUM 9.5 08/10/2018 1158   GFRNONAA 109 08/10/2018 1158   GFRAA 125  08/10/2018 1158   Lab Results  Component Value Date   HGBA1C 5.4 08/10/2018   HGBA1C 6.0 (H) 02/12/2018   HGBA1C 7.2 (H) 10/20/2017   HGBA1C 7.3 (H) 07/14/2017   HGBA1C 7.7 (H) 04/03/2017   Lab Results  Component Value Date   INSULIN 8.9 02/12/2018   INSULIN 9.1 10/20/2017   CBC    Component Value Date/Time   WBC 7.9 10/20/2017 0940   WBC 10.6 (H) 04/05/2017 0443   RBC 5.35 (H) 10/20/2017 0940   RBC 5.16 (H) 04/05/2017 0443   HGB 15.0 10/20/2017 0940   HCT 48.0 (H) 10/20/2017 0940   PLT 265 04/05/2017 0443   MCV 90 10/20/2017 0940   MCH 28.0 10/20/2017 0940   MCH 27.3 04/05/2017 0443   MCHC 31.3 (L) 10/20/2017 0940   MCHC 29.4 (L) 04/05/2017 0443   RDW 16.0 (H) 10/20/2017 0940   LYMPHSABS 1.2 10/20/2017 0940   MONOABS 0.7 04/05/2017 0443   EOSABS 0.1 10/20/2017 0940   BASOSABS 0.1 10/20/2017 0940   Iron/TIBC/Ferritin/ %Sat No results found for: IRON, TIBC, FERRITIN, IRONPCTSAT Lipid Panel     Component Value Date/Time   CHOL 127 10/20/2017 0940   TRIG 91 10/20/2017 0940   HDL 47 10/20/2017 0940   CHOLHDL 3 09/13/2016 0958   VLDL 21.8 09/13/2016 0958   LDLCALC 62 10/20/2017 0940   Hepatic Function Panel     Component Value Date/Time   PROT 7.2 08/10/2018 1158   ALBUMIN 4.6 08/10/2018 1158   AST 22 08/10/2018 1158   ALT 37 (H) 08/10/2018 1158   ALKPHOS 148 (H) 08/10/2018 1158   BILITOT 0.5 08/10/2018 1158      Component Value Date/Time   TSH 2.790 10/20/2017 0940   Results for ALYSHA, DOOLAN (MRN [id number]) as of 10/01/2018 09:47  Ref. Range 08/10/2018 11:58  Vitamin D, 25-Hydroxy Latest Ref Range: 30.0 - 100.0 ng/mL 58.9   I, Michaelene Song, am acting as Location manager for [employer], FNP  I have reviewed the above documentation for accuracy and completeness, and I agree with the above.  - Donell Tomkins, FNP-C.

## 2018-10-14 ENCOUNTER — Ambulatory Visit (INDEPENDENT_AMBULATORY_CARE_PROVIDER_SITE_OTHER): Payer: BC Managed Care – PPO | Admitting: Family Medicine

## 2018-10-16 ENCOUNTER — Other Ambulatory Visit: Payer: Self-pay

## 2018-10-20 ENCOUNTER — Encounter: Payer: Self-pay | Admitting: Gynecology

## 2018-10-20 ENCOUNTER — Ambulatory Visit: Payer: BC Managed Care – PPO | Admitting: Gynecology

## 2018-10-20 ENCOUNTER — Other Ambulatory Visit: Payer: Self-pay

## 2018-10-20 VITALS — BP 134/86 | Ht 65.0 in | Wt 293.0 lb

## 2018-10-20 DIAGNOSIS — Z30431 Encounter for routine checking of intrauterine contraceptive device: Secondary | ICD-10-CM | POA: Diagnosis not present

## 2018-10-20 DIAGNOSIS — Z23 Encounter for immunization: Secondary | ICD-10-CM | POA: Diagnosis not present

## 2018-10-20 DIAGNOSIS — Z01419 Encounter for gynecological examination (general) (routine) without abnormal findings: Secondary | ICD-10-CM

## 2018-10-20 NOTE — Addendum Note (Signed)
Addended by: Thurnell Garbe A on: 10/20/2018 08:33 AM   Modules accepted: Orders

## 2018-10-20 NOTE — Progress Notes (Signed)
    [name] [facility] 09/24/1965 [id number]        53 y.o.  G2P2 for annual gynecologic exam.  Several issues noted below.  Past medical history,surgical history, problem list, medications, allergies, family history and social history were all reviewed and documented as reviewed in the EPIC chart.  ROS:  Performed with pertinent positives and negatives included in the history, assessment and plan.   Additional significant findings : None   Exam: Copywriter, advertising Vitals:   10/20/18 0809  BP: 134/86  Weight: 293 lb (132.9 kg)  Height: 5\' 5"  (1.651 m)   Body mass index is 48.76 kg/m.  General appearance:  Normal affect, orientation and appearance. Skin: Grossly normal HEENT: Without gross lesions.  No cervical or supraclavicular adenopathy. Thyroid normal.  Lungs:  Clear without wheezing, rales or rhonchi Cardiac: RR, without RMG Abdominal:  Soft, nontender, without masses, guarding, rebound, organomegaly or hernia Breasts:  Examined lying and sitting without masses, retractions, discharge or axillary adenopathy. Pelvic:  Ext, BUS, Vagina: Normal  Cervix: Normal.  IUD string visualized.  Pap smear/HPV  Uterus: Unable to palpate but no gross masses or tenderness.  Adnexa: Without masses or tenderness    Anus and perineum: Normal   Rectovaginal: Normal sphincter tone without palpated masses or tenderness.    Assessment/Plan:  53 y.o. G2P2 female for annual gynecologic exam.  Without menses, Mirena IUD  1. Mirena IUD 12/2013.  Discussed the need to address her IUD this year.  [place] last year.  Without significant menopausal symptoms.  Options for management include [place] today and remove IUD if elevated.  Use backup contraception and keep menstrual calendar versus replacing IUD now for ongoing contraception and menstrual suppression.  At this point the patient prefers to replace her IUD and will follow-up for this by the end of the year. 2. Pap smear/HPV 2015.  Pap smear/HPV  today.  No history of abnormal Pap smears. 3. Mammography 05/2017.  Reminded patient she is overdue and she plans to call and schedule.  Breast exam normal today. 4. Colonoscopy never.  Doing Cologuard through her primary physician's office. 5. Health maintenance.  Received flu shot today.  No routine blood work done as she does this elsewhere.  Follow-up for IUD replacement.  Follow-up in 1 year for annual exam.   Anastasio Auerbach MD, 8:25 AM 10/20/2018

## 2018-10-20 NOTE — Patient Instructions (Signed)
Follow up for IUD replacement appointment 

## 2018-10-21 ENCOUNTER — Ambulatory Visit (INDEPENDENT_AMBULATORY_CARE_PROVIDER_SITE_OTHER): Payer: BC Managed Care – PPO | Admitting: Physician Assistant

## 2018-10-21 LAB — PAP IG AND HPV HIGH-RISK: HPV DNA High Risk: NOT DETECTED

## 2018-10-29 ENCOUNTER — Other Ambulatory Visit (INDEPENDENT_AMBULATORY_CARE_PROVIDER_SITE_OTHER): Payer: Self-pay | Admitting: Family Medicine

## 2018-10-29 DIAGNOSIS — I10 Essential (primary) hypertension: Secondary | ICD-10-CM

## 2018-10-31 ENCOUNTER — Other Ambulatory Visit (INDEPENDENT_AMBULATORY_CARE_PROVIDER_SITE_OTHER): Payer: Self-pay | Admitting: Family Medicine

## 2018-10-31 DIAGNOSIS — I10 Essential (primary) hypertension: Secondary | ICD-10-CM

## 2018-11-02 MED ORDER — METOPROLOL SUCCINATE ER 50 MG PO TB24: 50.0000 mg | ORAL_TABLET | Freq: Every day | ORAL | 0 refills | Status: DC

## 2018-11-04 ENCOUNTER — Ambulatory Visit (INDEPENDENT_AMBULATORY_CARE_PROVIDER_SITE_OTHER): Payer: BC Managed Care – PPO | Admitting: Family Medicine

## 2018-11-04 ENCOUNTER — Encounter: Payer: Self-pay | Admitting: Gynecology

## 2018-11-04 ENCOUNTER — Other Ambulatory Visit: Payer: Self-pay

## 2018-11-04 VITALS — BP 128/72 | HR 52 | Temp 97.6°F | Ht 65.0 in | Wt 286.0 lb

## 2018-11-04 DIAGNOSIS — Z6841 Body Mass Index (BMI) 40.0 and over, adult: Secondary | ICD-10-CM

## 2018-11-04 DIAGNOSIS — Z9189 Other specified personal risk factors, not elsewhere classified: Secondary | ICD-10-CM | POA: Diagnosis not present

## 2018-11-04 DIAGNOSIS — F3289 Other specified depressive episodes: Secondary | ICD-10-CM | POA: Diagnosis not present

## 2018-11-04 DIAGNOSIS — E119 Type 2 diabetes mellitus without complications: Secondary | ICD-10-CM

## 2018-11-04 DIAGNOSIS — E559 Vitamin D deficiency, unspecified: Secondary | ICD-10-CM | POA: Diagnosis not present

## 2018-11-04 MED ORDER — BUPROPION HCL ER (SR) 150 MG PO TB12: 150.0000 mg | ORAL_TABLET | Freq: Every day | ORAL | 0 refills | Status: DC

## 2018-11-09 ENCOUNTER — Encounter (INDEPENDENT_AMBULATORY_CARE_PROVIDER_SITE_OTHER): Payer: Self-pay | Admitting: Family Medicine

## 2018-11-09 DIAGNOSIS — F32A Depression, unspecified: Secondary | ICD-10-CM | POA: Insufficient documentation

## 2018-11-09 DIAGNOSIS — F329 Major depressive disorder, single episode, unspecified: Secondary | ICD-10-CM | POA: Insufficient documentation

## 2018-11-09 NOTE — Progress Notes (Signed)
Office: [phone]  /  Fax: [phone]   HPI:   Chief Complaint: OBESITY [name] is here to discuss her progress with her obesity treatment plan. She is on the Category 4 plan +100 to 200 calories and she is following her eating plan approximately 80 % of the time. She states she is walking 35 to 40 minutes 2 to 3 times per week. [name] had gained 4 pounds but she has now lost that. [name] has been doing better on the plan. She has had some episodes of eating off the plan especially on the weekends. She is not exercising much. [name] had been walking 5 to 6 days per week. Her weight is 286 lb (129.7 kg) today and she has maintained weight since her last in-office visit. She has lost 92 lbs since starting treatment with [place].  Diabetes II [name] has a diagnosis of diabetes type II. She is well controlled on metformin. [name] states fasting BGs range between 90 and 110 and 2 hour post prandial BGs are in the 130's. She denies any hypoglycemic episodes. Last A1c was at 5.4 She has been working on intensive lifestyle modifications including diet, exercise, and weight loss to help control her blood glucose levels.  Vitamin D deficiency [name] has a diagnosis of vitamin D deficiency. [name] is on OTC vit D 2,000 IU daily and her vitamin D Morris is at goal. She denies nausea, vomiting or muscle weakness.  At risk for osteopenia and osteoporosis [name] is at higher risk of osteopenia and osteoporosis due to vitamin D deficiency.   Depression with emotional eating behaviors [name] tries to be aware of emotional eating and succeeds at times. [name] struggles with emotional eating and using food for comfort to the extent that it is negatively impacting her health. She often snacks when she is not hungry. [name] sometimes feels she is out of control and then feels guilty that she made poor food choices. She has been working on behavior modification techniques to help reduce her emotional  eating and has been somewhat successful. [name] declined Dr. Mallie Mussel. She shows no sign of suicidal or homicidal ideations.  ASSESSMENT AND PLAN:  Type 2 diabetes mellitus without complication, without long-term current use of insulin (HCC)  Vitamin D deficiency  Other depression - Plan: buPROPion (WELLBUTRIN SR) 150 MG 12 hr tablet  At risk for osteoporosis  Class 3 severe obesity with serious comorbidity and body mass index (BMI) of 45.0 to 49.9 in adult, unspecified obesity type ([place])  PLAN:  Diabetes II [name] has been given extensive diabetes education by myself today including ideal fasting and post-prandial blood glucose readings, individual ideal Hgb A1c goals and hypoglycemia prevention. We discussed the importance of good blood sugar control to decrease the likelihood of diabetic complications such as nephropathy, neuropathy, limb loss, blindness, coronary artery disease, and death. We discussed the importance of intensive lifestyle modification including diet, exercise and weight loss as the first line treatment for diabetes. Tima will continue metformin and follow up at the agreed upon time.  Vitamin D Deficiency [name] was informed that low vitamin D levels contributes to fatigue and are associated with obesity, breast, and colon cancer. [name] will continue to take OTC Vit D _0 ,000 IU daily and she will follow up for routine testing of vitamin D, at least 2-3 times per year. She was informed of the risk of over-replacement of vitamin D and agrees to not increase her dose unless she discusses this with [place] first. We will  check vitamin D Morris next month and Samanda will follow up at the agreed upon time.  At risk for osteopenia and osteoporosis [name] was given extended (15 minutes) osteoporosis prevention counseling today. Zamoria is at risk for osteopenia and osteoporosis due to her vitamin D deficiency. She was encouraged to take her vitamin D and follow her higher  calcium diet and increase strengthening exercise to help strengthen her bones and decrease her risk of osteopenia and osteoporosis.  Depression with Emotional Eating Behaviors We discussed behavior modification techniques today to help [name] deal with her emotional eating and depression. She has agreed to start Bupropion SR 150 mg qAM #30 with no refills and follow up as directed. Patient has no history of seizures or glaucoma. Patient was informed of side effects.   Obesity [name] is currently in the action stage of change. As such, her goal is to continue with weight loss efforts She has agreed to follow the Category 4 plan [name] will increase days of walking and she will try to go to the gym for weight loss and overall health benefits. We discussed the following Behavioral Modification Strategies today: decrease eating out and planning for success [name] will stick to the Category 4 plan without extra calories. Handouts for emotional eating vs. hunger was provided to patient today.  [name] has agreed to follow up with our clinic in 2 to 3 weeks. She was informed of the importance of frequent follow up visits to maximize her success with intensive lifestyle modifications for her multiple health conditions.  ALLERGIES: No Known Allergies  MEDICATIONS: Current Outpatient Medications on File Prior to Visit  Medication Sig Dispense Refill  . Ascorbic Acid (VITAMIN C PO) Take by mouth.    [name] atorvastatin (LIPITOR) 20 MG tablet TAKE 1 TABLET BY MOUTH EVERY DAY 90 tablet 3  . blood glucose meter kit and supplies Dispense based on patient and insurance preference. Use up to twice daily as directed. (FOR ICD-10 E10.9, E11.9). 1 each 0  . [facility] ASPIRIN LOW STRENGTH 81 MG EC tablet TAKE 1 TABLET BY MOUTH EVERY DAY 90 tablet 1  . furosemide (LASIX) 40 MG tablet TAKE 1 TABLET BY MOUTH EVERY DAY 90 tablet 1  . glucose blood (ONE TOUCH ULTRA TEST) test strip USE TO TEST BLOOD GLUCOSE 2 TIMES DAILY  AS DIRECTED 100 each 0  . ibuprofen (ADVIL,MOTRIN) 200 MG tablet Take 200 mg by mouth every 6 (six) hours as needed.    [name] levonorgestrel (MIRENA) 20 MCG/24HR IUD 1 each by Intrauterine route once.    [name] losartan (COZAAR) 100 MG tablet Take 1 tablet (100 mg total) by mouth daily. 90 tablet 0  . metFORMIN (GLUCOPHAGE) 1000 MG tablet TAKE 1 TABLET (1,000 MG TOTAL) BY MOUTH 2 (TWO) TIMES DAILY WITH A MEAL. 180 tablet 1  . metoprolol succinate (TOPROL-XL) 50 MG 24 hr tablet Take 1 tablet (50 mg total) by mouth daily. Take with or immediately following a meal. 30 tablet 0  . OneTouch Delica Lancets 79Y MISC USE TO TEST BLOOD GLUCOSE UP TO 2 TIMES DAILY AS DIRECTED 100 each 0  . Polyethyl Glycol-Propyl Glycol (SYSTANE) 0.4-0.3 % SOLN Apply to eye.    . Pseudoephedrine-Acetaminophen (ALKA-SELTZER PLUS COLD/SINUS PO) Take by mouth.    . Vitamin D, Cholecalciferol, 50 MCG (2000 UT) CAPS Take 1 capsule by mouth daily.     Current Facility-Administered Medications on File Prior to Visit  Medication Dose Route Frequency Provider Last Rate Last Dose  . levonorgestrel (  MIRENA) 20 MCG/24HR IUD   Intrauterine Once [name], [name], MD        PAST MEDICAL HISTORY: Past Medical History:  Diagnosis Date  . Allergy   . Back pain   . CHF (congestive heart failure) ([place])   . Diabetes ([place])   . Dyspnea   . Gallbladder problem   . GERD (gastroesophageal reflux disease)   . Hip pain   . Hyperlipemia   . Hypertension   . Knee pain   . Leg edema   . Obesity   . Sleep apnea   . Venous insufficiency     PAST SURGICAL HISTORY: Past Surgical History:  Procedure Laterality Date  . Birth [place] removed    . CHOLECYSTECTOMY    . INTRAUTERINE DEVICE INSERTION  12/31/2013   Mirena    SOCIAL HISTORY: Social History   Tobacco Use  . Smoking status: Never Smoker  . Smokeless tobacco: Never Used  Substance Use Topics  . Alcohol use: Yes    Alcohol/week: 0.0 standard drinks    Comment: Rare  . Drug use:  No    FAMILY HISTORY: Family History  Problem Relation Age of Onset  . Hypertension Father   . Diabetes Father   . Obesity Father   . Cancer Paternal Grandfather        Stomach  . Cancer Maternal Uncle        Pancreatic  . Cancer Paternal Uncle        Kidney  . Hypertension Mother   . Anxiety disorder Mother     ROS: Review of Systems  Constitutional: Negative for weight loss.  Gastrointestinal: Negative for nausea and vomiting.  Musculoskeletal:       Negative for muscle weakness  Endo/Heme/Allergies:       Negative for hypoglycemia  Psychiatric/Behavioral: Positive for depression. Negative for suicidal ideas.    PHYSICAL EXAM: Pulse (!) 52, temperature 97.6 F (36.4 C), temperature source Oral, height 5' 5" (1.651 m), weight 286 lb (129.7 kg), SpO2 100 %. Body mass index is 47.59 kg/m. Physical Exam Vitals signs reviewed.  Constitutional:      Appearance: Normal appearance. She is well-developed. She is obese.  Cardiovascular:     Rate and Rhythm: Normal rate.  Pulmonary:     Effort: Pulmonary effort is normal.  Musculoskeletal: Normal range of motion.  Skin:    General: Skin is warm and dry.  Neurological:     Mental Status: She is alert and oriented to person, place, and time.  Psychiatric:        Mood and Affect: Mood normal.        Behavior: Behavior normal.        Thought Content: Thought content does not include homicidal or suicidal ideation.     RECENT LABS AND TESTS: BMET    Component Value Date/Time   NA 144 08/10/2018 1158   K 4.0 08/10/2018 1158   CL 104 08/10/2018 1158   CO2 25 08/10/2018 1158   GLUCOSE 86 08/10/2018 1158   GLUCOSE 115 (H) 04/10/2017 1656   BUN 23 08/10/2018 1158   CREATININE 0.54 (L) 08/10/2018 1158   CALCIUM 9.5 08/10/2018 1158   GFRNONAA 109 08/10/2018 1158   GFRAA 125 08/10/2018 1158   Lab Results  Component Value Date   HGBA1C 5.4 08/10/2018   HGBA1C 6.0 (H) 02/12/2018   HGBA1C 7.2 (H) 10/20/2017   HGBA1C  7.3 (H) 07/14/2017   HGBA1C 7.7 (H) 04/03/2017   Lab Results  Component  Value Date   INSULIN 8.9 02/12/2018   INSULIN 9.1 10/20/2017   CBC    Component Value Date/Time   WBC 7.9 10/20/2017 0940   WBC 10.6 (H) 04/05/2017 0443   RBC 5.35 (H) 10/20/2017 0940   RBC 5.16 (H) 04/05/2017 0443   HGB 15.0 10/20/2017 0940   HCT 48.0 (H) 10/20/2017 0940   PLT 265 04/05/2017 0443   MCV 90 10/20/2017 0940   MCH 28.0 10/20/2017 0940   MCH 27.3 04/05/2017 0443   MCHC 31.3 (L) 10/20/2017 0940   MCHC 29.4 (L) 04/05/2017 0443   RDW 16.0 (H) 10/20/2017 0940   LYMPHSABS 1.2 10/20/2017 0940   MONOABS 0.7 04/05/2017 0443   EOSABS 0.1 10/20/2017 0940   BASOSABS 0.1 10/20/2017 0940   Iron/TIBC/Ferritin/ %Sat No results found for: IRON, TIBC, FERRITIN, IRONPCTSAT Lipid Panel     Component Value Date/Time   CHOL 127 10/20/2017 0940   TRIG 91 10/20/2017 0940   HDL 47 10/20/2017 0940   CHOLHDL 3 09/13/2016 0958   VLDL 21.8 09/13/2016 0958   LDLCALC 62 10/20/2017 0940   Hepatic Function Panel     Component Value Date/Time   PROT 7.2 08/10/2018 1158   ALBUMIN 4.6 08/10/2018 1158   AST 22 08/10/2018 1158   ALT 37 (H) 08/10/2018 1158   ALKPHOS 148 (H) 08/10/2018 1158   BILITOT 0.5 08/10/2018 1158      Component Value Date/Time   TSH 2.790 10/20/2017 0940     Ref. Range 08/10/2018 11:58  Vitamin D, 25-Hydroxy Latest Ref Range: 30.0 - 100.0 ng/mL 58.9    OBESITY BEHAVIORAL INTERVENTION VISIT  Today's visit was # 19   Starting weight: 378 lbs Starting date: 10/20/2017 Today's weight : 286 lbs Today's date: 11/04/2018 Total lbs lost to date: 92    11/04/2018  Height 5' 5" (1.651 m)  Weight 286 lb (129.7 kg)  BMI (Calculated) 47.59   Body Fat % 55.1 %    ASK: We discussed the diagnosis of obesity with [name] today and [name] agreed to give [place] permission to discuss obesity behavioral modification therapy today.  ASSESS: [name] has the diagnosis of obesity and  her BMI today is 47.59 [name] is in the action stage of change   ADVISE: [name] was educated on the multiple health risks of obesity as well as the benefit of weight loss to improve her health. She was advised of the need for long term treatment and the importance of lifestyle modifications to improve her current health and to decrease her risk of future health problems.  AGREE: Multiple dietary modification options and treatment options were discussed and  [name] agreed to follow the recommendations documented in the above note.  ARRANGE: Tammee was educated on the importance of frequent visits to treat obesity as outlined per CMS and USPSTF guidelines and agreed to schedule her next follow up appointment today.  I, Doreene Nest, am acting as transcriptionist for [employer], FNP-C  I have reviewed the above documentation for accuracy and completeness, and I agree with the above.  - Brandey Vandalen, FNP-C.

## 2018-11-10 ENCOUNTER — Other Ambulatory Visit: Payer: Self-pay | Admitting: Family Medicine

## 2018-11-10 ENCOUNTER — Other Ambulatory Visit (INDEPENDENT_AMBULATORY_CARE_PROVIDER_SITE_OTHER): Payer: Self-pay | Admitting: Physician Assistant

## 2018-11-10 DIAGNOSIS — E119 Type 2 diabetes mellitus without complications: Secondary | ICD-10-CM

## 2018-11-11 ENCOUNTER — Other Ambulatory Visit: Payer: Self-pay | Admitting: Family Medicine

## 2018-11-11 MED ORDER — ONETOUCH ULTRA BLUE VI STRP: ORAL_STRIP | 0 refills | Status: DC

## 2018-11-25 ENCOUNTER — Encounter (INDEPENDENT_AMBULATORY_CARE_PROVIDER_SITE_OTHER): Payer: Self-pay | Admitting: Family Medicine

## 2018-11-25 ENCOUNTER — Ambulatory Visit (INDEPENDENT_AMBULATORY_CARE_PROVIDER_SITE_OTHER): Payer: BC Managed Care – PPO | Admitting: Family Medicine

## 2018-11-25 ENCOUNTER — Other Ambulatory Visit: Payer: Self-pay

## 2018-11-25 ENCOUNTER — Encounter: Payer: Self-pay | Admitting: Family Medicine

## 2018-11-25 VITALS — BP 115/73 | HR 54 | Temp 98.0°F | Ht 65.0 in | Wt 284.0 lb

## 2018-11-25 DIAGNOSIS — I10 Essential (primary) hypertension: Secondary | ICD-10-CM | POA: Diagnosis not present

## 2018-11-25 DIAGNOSIS — G4730 Sleep apnea, unspecified: Secondary | ICD-10-CM | POA: Diagnosis not present

## 2018-11-25 DIAGNOSIS — Z6841 Body Mass Index (BMI) 40.0 and over, adult: Secondary | ICD-10-CM

## 2018-11-25 DIAGNOSIS — E66813 Obesity, class 3: Secondary | ICD-10-CM

## 2018-11-25 DIAGNOSIS — E119 Type 2 diabetes mellitus without complications: Secondary | ICD-10-CM | POA: Diagnosis not present

## 2018-11-25 DIAGNOSIS — Z9189 Other specified personal risk factors, not elsewhere classified: Secondary | ICD-10-CM | POA: Diagnosis not present

## 2018-11-25 MED ORDER — METOPROLOL SUCCINATE ER 50 MG PO TB24: 50.0000 mg | ORAL_TABLET | Freq: Every day | ORAL | 0 refills | Status: DC

## 2018-11-25 MED ORDER — ONETOUCH DELICA LANCETS 33G MISC: 0 refills | Status: DC

## 2018-11-25 MED ORDER — LOSARTAN POTASSIUM 100 MG PO TABS: 100.0000 mg | ORAL_TABLET | Freq: Every day | ORAL | 0 refills | Status: DC

## 2018-11-27 ENCOUNTER — Encounter (INDEPENDENT_AMBULATORY_CARE_PROVIDER_SITE_OTHER): Payer: Self-pay | Admitting: Family Medicine

## 2018-11-27 ENCOUNTER — Telehealth: Payer: Self-pay

## 2018-11-27 NOTE — Telephone Encounter (Signed)
Copied from [place] [id number]. Topic: Appointment Scheduling - Scheduling Inquiry for Clinic >> Nov 27, 2018 10:58 AM [name] wrote: Patient requesting call back from office to discuss whether it is possible to [facility] back to Dr. Maudie Mercury. She is aware Dr. Maudie Mercury is only virtual.

## 2018-11-30 NOTE — Telephone Encounter (Signed)
Ok with me 

## 2018-11-30 NOTE — Telephone Encounter (Signed)
FYI

## 2018-11-30 NOTE — Progress Notes (Signed)
Office: [phone]  /  Fax: [phone]   HPI:   Chief Complaint: OBESITY [name] is here to discuss her progress with her obesity treatment plan. She is on the Category 4 plan and is following her eating plan approximately 75 to 80 % of the time. She states she is walking 35 to 40 minutes 4 times per week. [name]'s indirect calorimetry today, revealed an increase of 36 calories (up to 2380). She is back on plan. [name] is satisfied with the Category 4 plan. She denies polyphagia. Her weight is 284 lb (128.8 kg) today and has had a weight loss of 2 pounds over a period of 3 weeks since her last visit. She has lost 94 lbs since starting treatment with [place].  OSA (obstructive sleep apnea) [name] has a diagnosis of sleep apnea and she is compliant with CPAP. She feels well rested.  Hypertension [name] is a 53 y.o. female with hypertension. [name]'s blood pressure is well controlled on Losartan and Metoprolol. [name] denies chest pain or shortness of breath on exertion. She is working weight loss to help control her blood pressure with the goal of decreasing her risk of heart attack and stroke.  BP Readings from Last 3 Encounters:  11/25/18 115/73  11/04/18 128/72  10/20/18 134/86      Diabetes II [name] has a diagnosis of diabetes type II. [name] is well controlled on metformin. She denies any hypoglycemic episodes. Last A1c was at 5.4 on 08/10/18. She has been working on intensive lifestyle modifications including diet, exercise, and weight loss to help control her blood glucose levels.  At risk for cardiovascular disease [name] is at a higher than average risk for cardiovascular disease due to obesity, hypertension and diabetes. She currently denies any chest pain.  ASSESSMENT AND PLAN:  Sleep apnea, unspecified type  Essential hypertension - Plan: metoprolol succinate (TOPROL-XL) 50 MG 24 hr tablet, losartan (COZAAR) 100 MG tablet  Type 2  diabetes mellitus without complication, without long-term current use of insulin ([place]) - Plan: OneTouch Delica Lancets 10U MISC  At risk for heart disease  Class 3 severe obesity with serious comorbidity and body mass index (BMI) of 45.0 to 49.9 in adult, unspecified obesity type ([place])  PLAN:  OSA (obstructive sleep apnea) [name] will continue with CPAP and she will follow up with our clinic in 3 weeks.  Hypertension We discussed sodium restriction, working on healthy weight loss, and a regular exercise program as the means to achieve improved blood pressure control. [name] agreed with this plan and agreed to follow up as directed. We will continue to monitor her blood pressure as well as her progress with the above lifestyle modifications. [name] agrees to continue losartan 100 mg daily #90 with no refills and Metoprolol 50 mg daily #30 with no refills. She will watch for signs of hypotension as she continues her lifestyle modifications.  Diabetes II [name] has been given extensive diabetes education by myself today including ideal fasting and post-prandial blood glucose readings, individual ideal Hgb A1c goals and hypoglycemia prevention. We discussed the importance of good blood sugar control to decrease the likelihood of diabetic complications such as nephropathy, neuropathy, limb loss, blindness, coronary artery disease, and death. We discussed the importance of intensive lifestyle modification including diet, exercise and weight loss as the first line treatment for diabetes. [name] will continue metformin and she agrees to follow up at the agreed upon time. Prescription was written today for lancets #100 with no refills.  Cardiovascular risk counseling [name] was given extended (15 minutes) coronary artery disease prevention counseling today. She is 53 y.o. female and has risk factors for heart disease including obesity, hypertension and diabetes. We discussed intensive lifestyle  modifications today with an emphasis on specific weight loss instructions and strategies. Pt was also informed of the importance of increasing exercise and decreasing saturated fats to help prevent heart disease.  Obesity [name] is currently in the action stage of change. As such, her goal is to continue with weight loss efforts She has agreed to follow the Category 4 plan [name] will continue walking 35 to 40 minutes, 4 times per week for weight loss and overall health benefits. We discussed the following Behavioral Modification Strategies today: planning for success  [name] has agreed to follow up with our clinic in 3 weeks. She was informed of the importance of frequent follow up visits to maximize her success with intensive lifestyle modifications for her multiple health conditions.  ALLERGIES: No Known Allergies  MEDICATIONS: Current Outpatient Medications on File Prior to Visit  Medication Sig Dispense Refill  . Ascorbic Acid (VITAMIN C PO) Take by mouth.    [name] aspirin 81 MG EC tablet TAKE 1 TABLET BY MOUTH EVERY DAY 90 tablet 1  . atorvastatin (LIPITOR) 20 MG tablet TAKE 1 TABLET BY MOUTH EVERY DAY 90 tablet 3  . blood glucose meter kit and supplies Dispense based on patient and insurance preference. Use up to twice daily as directed. (FOR ICD-10 E10.9, E11.9). 1 each 0  . buPROPion (WELLBUTRIN SR) 150 MG 12 hr tablet Take 1 tablet (150 mg total) by mouth daily. 30 tablet 0  . furosemide (LASIX) 40 MG tablet TAKE 1 TABLET BY MOUTH EVERY DAY 90 tablet 1  . glucose blood (ONE TOUCH ULTRA TEST) test strip USE TO TEST BLOOD GLUCOSE 2 TIMES DAILY AS DIRECTED 100 each 0  . ibuprofen (ADVIL,MOTRIN) 200 MG tablet Take 200 mg by mouth every 6 (six) hours as needed.    [name] levonorgestrel (MIRENA) 20 MCG/24HR IUD 1 each by Intrauterine route once.    . metFORMIN (GLUCOPHAGE) 1000 MG tablet TAKE 1 TABLET (1,000 MG TOTAL) BY MOUTH 2 (TWO) TIMES DAILY WITH A MEAL. 180 tablet 1  . Polyethyl  Glycol-Propyl Glycol (SYSTANE) 0.4-0.3 % SOLN Apply to eye.    . Pseudoephedrine-Acetaminophen (ALKA-SELTZER PLUS COLD/SINUS PO) Take by mouth.    . Vitamin D, Cholecalciferol, 50 MCG (2000 UT) CAPS Take 1 capsule by mouth daily.     Current Facility-Administered Medications on File Prior to Visit  Medication Dose Route Frequency Provider Last Rate Last Dose  . levonorgestrel (MIRENA) 20 MCG/24HR IUD   Intrauterine Once [name], [name], MD        PAST MEDICAL HISTORY: Past Medical History:  Diagnosis Date  . Allergy   . Back pain   . CHF (congestive heart failure) ([place])   . Diabetes ([place])   . Dyspnea   . Gallbladder problem   . GERD (gastroesophageal reflux disease)   . Hip pain   . Hyperlipemia   . Hypertension   . Knee pain   . Leg edema   . Obesity   . Sleep apnea   . Venous insufficiency     PAST SURGICAL HISTORY: Past Surgical History:  Procedure Laterality Date  . Birth [place]ta [place] removed    . CHOLECYSTECTOMY    . INTRAUTERINE DEVICE INSERTION  12/31/2013   Mirena    SOCIAL HISTORY: Social History   Tobacco Use  .  Smoking status: Never Smoker  . Smokeless tobacco: Never Used  Substance Use Topics  . Alcohol use: Yes    Alcohol/week: 0.0 standard drinks    Comment: Rare  . Drug use: No    FAMILY HISTORY: Family History  Problem Relation Age of Onset  . Hypertension Father   . Diabetes Father   . Obesity Father   . Cancer Paternal Grandfather        Stomach  . Cancer Maternal Uncle        Pancreatic  . Cancer Paternal Uncle        Kidney  . Hypertension Mother   . Anxiety disorder Mother     ROS: Review of Systems  Constitutional: Positive for weight loss.  Respiratory: Negative for shortness of breath (on exertion).   Cardiovascular: Negative for chest pain.  Endo/Heme/Allergies:       Negative for hypoglycemia    PHYSICAL EXAM: Blood pressure 115/73, pulse (!) 54, temperature 98 F (36.7 C), temperature source Oral, height _0  (1.651  m), weight 284 lb (128.8 kg), SpO2 98 %. Body mass index is 47.26 kg/m. Physical Exam Vitals signs reviewed.  Constitutional:      Appearance: Normal appearance. She is well-developed. She is obese.  Cardiovascular:     Rate and Rhythm: Normal rate.  Pulmonary:     Effort: Pulmonary effort is normal.  Musculoskeletal: Normal range of motion.  Skin:    General: Skin is warm and dry.  Neurological:     Mental Status: She is alert and oriented to person, place, and time.  Psychiatric:        Mood and Affect: Mood normal.        Behavior: Behavior normal.     RECENT LABS AND TESTS: BMET    Component Value Date/Time   NA 144 08/10/2018 1158   K 4.0 08/10/2018 1158   CL 104 08/10/2018 1158   CO2 25 08/10/2018 1158   GLUCOSE 86 08/10/2018 1158   GLUCOSE 115 (H) 04/10/2017 1656   BUN 23 08/10/2018 1158   CREATININE 0.54 (L) 08/10/2018 1158   CALCIUM 9.5 08/10/2018 1158   GFRNONAA 109 08/10/2018 1158   GFRAA 125 08/10/2018 1158   Lab Results  Component Value Date   HGBA1C 5.4 08/10/2018   HGBA1C 6.0 (H) 02/12/2018   HGBA1C 7.2 (H) 10/20/2017   HGBA1C 7.3 (H) 07/14/2017   HGBA1C 7.7 (H) 04/03/2017   Lab Results  Component Value Date   INSULIN 8.9 02/12/2018   INSULIN 9.1 10/20/2017   CBC    Component Value Date/Time   WBC 7.9 10/20/2017 0940   WBC 10.6 (H) 04/05/2017 0443   RBC 5.35 (H) 10/20/2017 0940   RBC 5.16 (H) 04/05/2017 0443   HGB 15.0 10/20/2017 0940   HCT 48.0 (H) 10/20/2017 0940   PLT 265 04/05/2017 0443   MCV 90 10/20/2017 0940   MCH 28.0 10/20/2017 0940   MCH 27.3 04/05/2017 0443   MCHC 31.3 (L) 10/20/2017 0940   MCHC 29.4 (L) 04/05/2017 0443   RDW 16.0 (H) 10/20/2017 0940   LYMPHSABS 1.2 10/20/2017 0940   MONOABS 0.7 04/05/2017 0443   EOSABS 0.1 10/20/2017 0940   BASOSABS 0.1 10/20/2017 0940   Iron/TIBC/Ferritin/ %Sat No results found for: IRON, TIBC, FERRITIN, IRONPCTSAT Lipid Panel     Component Value Date/Time   CHOL 127 10/20/2017  0940   TRIG 91 10/20/2017 0940   HDL 47 10/20/2017 0940   CHOLHDL 3 09/13/2016 0958   VLDL  21.8 09/13/2016 0958   LDLCALC 62 10/20/2017 0940   Hepatic Function Panel     Component Value Date/Time   PROT 7.2 08/10/2018 1158   ALBUMIN 4.6 08/10/2018 1158   AST 22 08/10/2018 1158   ALT 37 (H) 08/10/2018 1158   ALKPHOS 148 (H) 08/10/2018 1158   BILITOT 0.5 08/10/2018 1158      Component Value Date/Time   TSH 2.790 10/20/2017 0940     Ref. Range 08/10/2018 11:58  Vitamin D, 25-Hydroxy Latest Ref Range: 30.0 - 100.0 ng/mL 58.9    OBESITY BEHAVIORAL INTERVENTION VISIT  Today's visit was # 20   Starting weight: 378 lbs Starting date: 10/20/2017 Today's weight : 284 lbs Today's date: 11/25/2018 Total lbs lost to date: 94    11/25/2018  Height _0  (1.651 m)  Weight 284 lb (128.8 kg)  BMI (Calculated) 47.26  BLOOD PRESSURE - SYSTOLIC 767  BLOOD PRESSURE - DIASTOLIC 73   Body Fat % 20.9 %  Total Body Water (lbs) 102 lbs  RMR 2380    ASK: We discussed the diagnosis of obesity with [name] today and [place] agreed to give [place] permission to discuss obesity behavioral modification therapy today.  ASSESS: Raylin has the diagnosis of obesity and her BMI today is 47.26 Kista is in the action stage of change   ADVISE: Pheobe was educated on the multiple health risks of obesity as well as the benefit of weight loss to improve her health. She was advised of the need for long term treatment and the importance of lifestyle modifications to improve her current health and to decrease her risk of future health problems.  AGREE: Multiple dietary modification options and treatment options were discussed and  Larie agreed to follow the recommendations documented in the above note.  ARRANGE: Megumi was educated on the importance of frequent visits to treat obesity as outlined per CMS and USPSTF guidelines and agreed to schedule her next follow up appointment today.   I, Doreene Nest, am acting as Location manager for [employer], I have reviewed the above documentation for accuracy and completeness, and I agree with the above.  - Bree Heinzelman, FNP-C.

## 2018-12-01 NOTE — Telephone Encounter (Signed)
Spoke with pt state that she will continue with Dr Volanda Napoleon as her PCP

## 2018-12-16 ENCOUNTER — Ambulatory Visit (INDEPENDENT_AMBULATORY_CARE_PROVIDER_SITE_OTHER): Payer: BC Managed Care – PPO | Admitting: Family Medicine

## 2018-12-16 ENCOUNTER — Other Ambulatory Visit: Payer: Self-pay

## 2018-12-16 ENCOUNTER — Encounter (INDEPENDENT_AMBULATORY_CARE_PROVIDER_SITE_OTHER): Payer: Self-pay | Admitting: Family Medicine

## 2018-12-16 VITALS — BP 123/75 | HR 55 | Temp 97.8°F | Ht 65.0 in | Wt 288.0 lb

## 2018-12-16 DIAGNOSIS — Z9189 Other specified personal risk factors, not elsewhere classified: Secondary | ICD-10-CM

## 2018-12-16 DIAGNOSIS — E7849 Other hyperlipidemia: Secondary | ICD-10-CM

## 2018-12-16 DIAGNOSIS — E119 Type 2 diabetes mellitus without complications: Secondary | ICD-10-CM

## 2018-12-16 DIAGNOSIS — E559 Vitamin D deficiency, unspecified: Secondary | ICD-10-CM | POA: Diagnosis not present

## 2018-12-16 DIAGNOSIS — Z6841 Body Mass Index (BMI) 40.0 and over, adult: Secondary | ICD-10-CM

## 2018-12-16 NOTE — Progress Notes (Signed)
Office: [phone]  /  Fax: [phone]   HPI:   Chief Complaint: OBESITY [name] is here to discuss her progress with her obesity treatment plan. She is on the Category 4 plan and is following her eating plan approximately 50 to 60 % of the time. She states she is walking 20 minutes 3 times per week. [name] admits to not being fully committed to the plan over the last few weeks. She has had company and her power has been out which has made sticking to the plan more difficult. [name] has eaten out more. Things are getting back to normal at her house. Her weight is 288 lb (130.6 kg) today and has had a weight gain of 4 pounds over a period of 3 weeks since her last visit. She has lost 90 lbs since starting treatment with [place].  Hyperlipidemia [name] has hyperlipidemia. She is on statin and her last fasting lipid panel was within normal limits, except for low HDL (47). She has been trying to improve her cholesterol levels with intensive lifestyle modification including a low saturated fat diet, exercise and weight loss. She denies any chest pain or shortness of breath. The 10-year ASCVD risk score Wanda Bussing DC Brooke Bonito., et al., 2013) is: 2.5%   Values used to calculate the score:     Age: 53 years     Sex: Female     Is Non-Hispanic African American: No     Diabetic: Yes     Tobacco smoker: No     Systolic Blood Pressure: 683 mmHg     Is BP treated: Yes     HDL Cholesterol: 53 mg/dL     Total Cholesterol: 133 mg/dL   At risk for cardiovascular disease Honor is at a higher than average risk for cardiovascular disease due to obesity, hyperlipidemia and diabetes. She currently denies any chest pain.  Diabetes II [name] has a diagnosis of diabetes type II. She is well controlled. Her sugars are higher than usual, but they are still very good. [name] states fasting blood sugars and 2 hour post prandials range between 100 and 130. [name] denies hypoglycemia. [name] is on metformin two times  daily. She has been working on intensive lifestyle modifications including diet, exercise, and weight loss to help control her blood glucose levels.  Vitamin D deficiency [name] has a diagnosis of vitamin D deficiency. Her vitamin D level is at goal. [name] is currently taking OTC vit D (2,000 IU daily), and she denies nausea, vomiting or muscle weakness.  ASSESSMENT AND PLAN:  Other hyperlipidemia - Plan: Lipid Panel With LDL/HDL Ratio  Type 2 diabetes mellitus without complication, without long-term current use of insulin ([name]) - Plan: Comprehensive metabolic panel, Hemoglobin A1c  Vitamin D deficiency - Plan: VITAMIN D 25 Hydroxy (Vit-D Deficiency, Fractures)  At risk for heart disease  Class 3 severe obesity with serious comorbidity and body mass index (BMI) of 45.0 to 49.9 in adult, unspecified obesity type ([name])  PLAN:  Hyperlipidemia We will check fasting lipid panel today and [name] will follow up as directed.   Cardiovascular risk counseling [name] was given extended (15 minutes) coronary artery disease prevention counseling today. She is 53 y.o. female and has risk factors for heart disease including obesity, hyperlipidemia and diabetes. We discussed intensive lifestyle modifications today with an emphasis on specific weight loss instructions and strategies. Pt was also informed of the importance of increasing exercise and decreasing saturated fats to help prevent heart disease.  Diabetes II  We  will check A1c and fasting glucose today. [name] agrees to continue her diabetes medications and she will follow up at the agreed upon time.   Vitamin D Deficiency [name] was informed that low vitamin D levels contributes to fatigue and are associated with obesity, breast, and colon cancer. [name] will continue OTC Vit D '@2' ,000 IU daily and she will follow up for routine testing of vitamin D, at least 2-3 times per year. She was informed of the risk of over-replacement of  vitamin D and agrees to not increase her dose unless she discusses this with [place] first. We will check vitamin D level and [name] will follow up as directed.  Obesity [name] is currently in the action stage of change. As such, her goal is to continue with weight loss efforts She has agreed to follow the Category 4 plan [name] will continue current exercise regimen for weight loss and overall health benefits. We discussed the following Behavioral Modification Strategies today: planning for success, increasing lean protein intake and decreasing simple carbohydrates   [name] has agreed to follow up with our clinic in 3 weeks. She was informed of the importance of frequent follow up visits to maximize her success with intensive lifestyle modifications for her multiple health conditions.  ALLERGIES: No Known Allergies  MEDICATIONS: Current Outpatient Medications on File Prior to Visit  Medication Sig Dispense Refill  . Ascorbic Acid (VITAMIN C PO) Take by mouth.    [name] aspirin 81 MG EC tablet TAKE 1 TABLET BY MOUTH EVERY DAY 90 tablet 1  . atorvastatin (LIPITOR) 20 MG tablet TAKE 1 TABLET BY MOUTH EVERY DAY 90 tablet 3  . blood glucose meter kit and supplies Dispense based on patient and insurance preference. Use up to twice daily as directed. (FOR ICD-10 E10.9, E11.9). 1 each 0  . buPROPion (WELLBUTRIN SR) 150 MG 12 hr tablet Take 1 tablet (150 mg total) by mouth daily. 30 tablet 0  . furosemide (LASIX) 40 MG tablet TAKE 1 TABLET BY MOUTH EVERY DAY 90 tablet 1  . glucose blood (ONE TOUCH ULTRA TEST) test strip USE TO TEST BLOOD GLUCOSE 2 TIMES DAILY AS DIRECTED 100 each 0  . ibuprofen (ADVIL,MOTRIN) 200 MG tablet Take 200 mg by mouth every 6 (six) hours as needed.    [name] levonorgestrel (MIRENA) 20 MCG/24HR IUD 1 each by Intrauterine route once.    [name] losartan (COZAAR) 100 MG tablet Take 1 tablet (100 mg total) by mouth daily. 90 tablet 0  . metFORMIN (GLUCOPHAGE) 1000 MG tablet TAKE 1 TABLET  (1,000 MG TOTAL) BY MOUTH 2 (TWO) TIMES DAILY WITH A MEAL. 180 tablet 1  . metoprolol succinate (TOPROL-XL) 50 MG 24 hr tablet Take 1 tablet (50 mg total) by mouth daily. Take with or immediately following a meal. 30 tablet 0  . OneTouch Delica Lancets 83J MISC USE TO TEST BLOOD GLUCOSE UP TO 2 TIMES DAILY AS DIRECTED 100 each 0  . Polyethyl Glycol-Propyl Glycol (SYSTANE) 0.4-0.3 % SOLN Apply to eye.    . Pseudoephedrine-Acetaminophen (ALKA-SELTZER PLUS COLD/SINUS PO) Take by mouth.    . Vitamin D, Cholecalciferol, 50 MCG (2000 UT) CAPS Take 1 capsule by mouth daily.     Current Facility-Administered Medications on File Prior to Visit  Medication Dose Route Frequency Provider Last Rate Last Dose  . levonorgestrel (MIRENA) 20 MCG/24HR IUD   Intrauterine Once [name], [name], MD        PAST MEDICAL HISTORY: Past Medical History:  Diagnosis Date  .  Allergy   . Back pain   . CHF (congestive heart failure) ([place])   . Diabetes ([place])   . Dyspnea   . Gallbladder problem   . GERD (gastroesophageal reflux disease)   . Hip pain   . Hyperlipemia   . Hypertension   . Knee pain   . Leg edema   . Obesity   . Sleep apnea   . Venous insufficiency     PAST SURGICAL HISTORY: Past Surgical History:  Procedure Laterality Date  . Birth Elta [place] removed    . CHOLECYSTECTOMY    . INTRAUTERINE DEVICE INSERTION  12/31/2013   Mirena    SOCIAL HISTORY: Social History   Tobacco Use  . Smoking status: Never Smoker  . Smokeless tobacco: Never Used  Substance Use Topics  . Alcohol use: Yes    Alcohol/week: 0.0 standard drinks    Comment: Rare  . Drug use: No    FAMILY HISTORY: Family History  Problem Relation Age of Onset  . Hypertension Father   . Diabetes Father   . Obesity Father   . Cancer Paternal Grandfather        Stomach  . Cancer Maternal Uncle        Pancreatic  . Cancer Paternal Uncle        Kidney  . Hypertension Mother   . Anxiety disorder Mother     ROS: Review of  Systems  Constitutional: Negative for weight loss.  Respiratory: Negative for shortness of breath.   Cardiovascular: Negative for chest pain.  Gastrointestinal: Negative for nausea and vomiting.  Musculoskeletal:       Negative for muscle weakness  Endo/Heme/Allergies:       Negative for hypoglycemia    PHYSICAL EXAM: Blood pressure 123/75, pulse (!) 55, temperature 97.8 F (36.6 C), temperature source Oral, height '5\' 5"'  (1.651 m), weight 288 lb (130.6 kg), SpO2 99 %. Body mass index is 47.93 kg/m. Physical Exam Vitals signs reviewed.  Constitutional:      Appearance: Normal appearance. She is well-developed. She is obese.  Cardiovascular:     Rate and Rhythm: Normal rate.  Pulmonary:     Effort: Pulmonary effort is normal.  Musculoskeletal: Normal range of motion.  Skin:    General: Skin is warm and dry.  Neurological:     Mental Status: She is alert and oriented to person, place, and time.  Psychiatric:        Mood and Affect: Mood normal.        Behavior: Behavior normal.     RECENT LABS AND TESTS: BMET    Component Value Date/Time   NA 144 08/10/2018 1158   K 4.0 08/10/2018 1158   CL 104 08/10/2018 1158   CO2 25 08/10/2018 1158   GLUCOSE 86 08/10/2018 1158   GLUCOSE 115 (H) 04/10/2017 1656   BUN 23 08/10/2018 1158   CREATININE 0.54 (L) 08/10/2018 1158   CALCIUM 9.5 08/10/2018 1158   GFRNONAA 109 08/10/2018 1158   GFRAA 125 08/10/2018 1158   Lab Results  Component Value Date   HGBA1C 5.4 08/10/2018   HGBA1C 6.0 (H) 02/12/2018   HGBA1C 7.2 (H) 10/20/2017   HGBA1C 7.3 (H) 07/14/2017   HGBA1C 7.7 (H) 04/03/2017   Lab Results  Component Value Date   INSULIN 8.9 02/12/2018   INSULIN 9.1 10/20/2017   CBC    Component Value Date/Time   WBC 7.9 10/20/2017 0940   WBC 10.6 (H) 04/05/2017 0443   RBC 5.35 (H) 10/20/2017 0940  RBC 5.16 (H) 04/05/2017 0443   HGB 15.0 10/20/2017 0940   HCT 48.0 (H) 10/20/2017 0940   PLT 265 04/05/2017 0443   MCV 90  10/20/2017 0940   MCH 28.0 10/20/2017 0940   MCH 27.3 04/05/2017 0443   MCHC 31.3 (L) 10/20/2017 0940   MCHC 29.4 (L) 04/05/2017 0443   RDW 16.0 (H) 10/20/2017 0940   LYMPHSABS 1.2 10/20/2017 0940   MONOABS 0.7 04/05/2017 0443   EOSABS 0.1 10/20/2017 0940   BASOSABS 0.1 10/20/2017 0940   Iron/TIBC/Ferritin/ %Sat No results found for: IRON, TIBC, FERRITIN, IRONPCTSAT Lipid Panel     Component Value Date/Time   CHOL 127 10/20/2017 0940   TRIG 91 10/20/2017 0940   HDL 47 10/20/2017 0940   CHOLHDL 3 09/13/2016 0958   VLDL 21.8 09/13/2016 0958   LDLCALC 62 10/20/2017 0940   Hepatic Function Panel     Component Value Date/Time   PROT 7.2 08/10/2018 1158   ALBUMIN 4.6 08/10/2018 1158   AST 22 08/10/2018 1158   ALT 37 (H) 08/10/2018 1158   ALKPHOS 148 (H) 08/10/2018 1158   BILITOT 0.5 08/10/2018 1158      Component Value Date/Time   TSH 2.790 10/20/2017 0940    Results for JAKI, STEPTOE (MRN [id number]) as of 12/16/2018 14:21  Ref. Range 08/10/2018 11:58  Vitamin D, 25-Hydroxy Latest Ref Range: 30.0 - 100.0 ng/mL 58.9    OBESITY BEHAVIORAL INTERVENTION VISIT  Today's visit was # 21   Starting weight: 378 lbs Starting date: 10/20/2017 Today's weight : 288 lbs Today's date: 12/16/2018 Total lbs lost to date: 90    12/16/2018  Height '5\' 5"'  (1.651 m)  Weight 288 lb (130.6 kg)  BMI (Calculated) 47.93  BLOOD PRESSURE - SYSTOLIC 354  BLOOD PRESSURE - DIASTOLIC 75   Body Fat % 56.2 %    ASK: We discussed the diagnosis of obesity with [name] today and Arlyce agreed to give [place] permission to discuss obesity behavioral modification therapy today.  ASSESS: Ridhima has the diagnosis of obesity and her BMI today is 47.93 Shontay is in the action stage of change   ADVISE: Jelissa was educated on the multiple health risks of obesity as well as the benefit of weight loss to improve her health. She was advised of the need for long term treatment and the  importance of lifestyle modifications to improve her current health and to decrease her risk of future health problems.  AGREE: Multiple dietary modification options and treatment options were discussed and  [name] agreed to follow the recommendations documented in the above note.  ARRANGE: Neala was educated on the importance of frequent visits to treat obesity as outlined per CMS and USPSTF guidelines and agreed to schedule her next follow up appointment today.  Corey Skains, am acting as Location manager for [employer], FNP-C.  I have reviewed the above documentation for accuracy and completeness, and I agree with the above.  - Tarrence Enck, FNP-C.

## 2018-12-17 ENCOUNTER — Encounter (INDEPENDENT_AMBULATORY_CARE_PROVIDER_SITE_OTHER): Payer: Self-pay | Admitting: Family Medicine

## 2018-12-17 LAB — COMPREHENSIVE METABOLIC PANEL
ALT: 38 IU/L — ABNORMAL HIGH (ref 0–32)
AST: 21 IU/L (ref 0–40)
Albumin/Globulin Ratio: 1.6 (ref 1.2–2.2)
Albumin: 4.4 g/dL (ref 3.8–4.9)
Alkaline Phosphatase: 140 IU/L — ABNORMAL HIGH (ref 39–117)
BUN/Creatinine Ratio: 35 — ABNORMAL HIGH (ref 9–23)
BUN: 22 mg/dL (ref 6–24)
Bilirubin Total: 0.4 mg/dL (ref 0.0–1.2)
CO2: 24 mmol/L (ref 20–29)
Calcium: 9.4 mg/dL (ref 8.7–10.2)
Chloride: 102 mmol/L (ref 96–106)
Creatinine, Ser: 0.63 mg/dL (ref 0.57–1.00)
GFR calc Af Amer: 118 mL/min/{1.73_m2} (ref >59)
GFR calc non Af Amer: 103 mL/min/{1.73_m2} (ref >59)
Globulin, Total: 2.7 g/dL (ref 1.5–4.5)
Glucose: 92 mg/dL (ref 65–99)
Potassium: 4.1 mmol/L (ref 3.5–5.2)
Sodium: 142 mmol/L (ref 134–144)
Total Protein: 7.1 g/dL (ref 6.0–8.5)

## 2018-12-17 LAB — LIPID PANEL WITH LDL/HDL RATIO
Cholesterol, Total: 133 mg/dL (ref 100–199)
HDL: 53 mg/dL (ref >39)
LDL Chol Calc (NIH): 66 mg/dL (ref 0–99)
LDL/HDL Ratio: 1.2 ratio (ref 0.0–3.2)
Triglycerides: 69 mg/dL (ref 0–149)
VLDL Cholesterol Cal: 14 mg/dL (ref 5–40)

## 2018-12-17 LAB — VITAMIN D 25 HYDROXY (VIT D DEFICIENCY, FRACTURES): Vit D, 25-Hydroxy: 44 ng/mL (ref 30.0–100.0)

## 2018-12-17 LAB — HEMOGLOBIN A1C
Est. average glucose Bld gHb Est-mCnc: 117 mg/dL
Hgb A1c MFr Bld: 5.7 % — ABNORMAL HIGH (ref 4.8–5.6)

## 2018-12-22 ENCOUNTER — Telehealth: Payer: Self-pay

## 2018-12-22 NOTE — Telephone Encounter (Signed)
Per mychart message Could you please review my bi-pap air levels?  I am getting excess air again.  Thank you! [name]  Please advise

## 2018-12-26 ENCOUNTER — Other Ambulatory Visit (INDEPENDENT_AMBULATORY_CARE_PROVIDER_SITE_OTHER): Payer: Self-pay | Admitting: Family Medicine

## 2018-12-26 DIAGNOSIS — I10 Essential (primary) hypertension: Secondary | ICD-10-CM

## 2018-12-28 ENCOUNTER — Encounter (INDEPENDENT_AMBULATORY_CARE_PROVIDER_SITE_OTHER): Payer: Self-pay

## 2018-12-28 MED ORDER — METOPROLOL SUCCINATE ER 50 MG PO TB24: 50.0000 mg | ORAL_TABLET | Freq: Every day | ORAL | 0 refills | Status: DC

## 2018-12-31 ENCOUNTER — Encounter: Payer: Self-pay | Admitting: Cardiology

## 2018-12-31 DIAGNOSIS — I5033 Acute on chronic diastolic (congestive) heart failure: Secondary | ICD-10-CM | POA: Insufficient documentation

## 2018-12-31 NOTE — Progress Notes (Signed)
Cardiology Office Note   Date:  01/01/2019   ID:  Fantasia, Jinkins 07/06/1965, MRN [id number]  PCP:  Billie Ruddy, MD  Cardiologist:   No primary care provider on file.   Chief Complaint  Patient presents with   Shortness of Breath     History of Present Illness: [name] is a 53 y.o. female who presents for follow of diastolic dysfunction.    Since I last saw her she has done well.  The patient denies any new symptoms such as chest discomfort, neck or arm discomfort. There has been no new shortness of breath, PND or orthopnea. There have been no reported palpitations, presyncope or syncope. She is a Pharmacist, [employer] students in high school. Her college daughter actually has Covid. Patient has been walking for exercise. She is done well with this.   Past Medical History:  Diagnosis Date   Chronic diastolic HF (heart failure) (H[facility])    Diabetes ([facility])    GERD (gastroesophageal reflux disease)    Hyperlipemia    Hypertension    Knee pain    Obesity    Sleep apnea    Venous insufficiency     Past Surgical History:  Procedure Laterality Date   Birth Elta [place] removed     CHOLECYSTECTOMY     INTRAUTERINE DEVICE INSERTION  12/31/2013   Mirena     Current Outpatient Medications  Medication Sig Dispense Refill   Ascorbic Acid (VITAMIN C PO) Take by mouth.     aspirin 81 MG EC tablet TAKE 1 TABLET BY MOUTH EVERY DAY 90 tablet 1   atorvastatin (LIPITOR) 20 MG tablet TAKE 1 TABLET BY MOUTH EVERY DAY 90 tablet 3   blood glucose meter kit and supplies Dispense based on patient and insurance preference. Use up to twice daily as directed. (FOR ICD-10 E10.9, E11.9). 1 each 0   furosemide (LASIX) 40 MG tablet TAKE 1 TABLET BY MOUTH EVERY DAY 90 tablet 1   glucose blood (ONE TOUCH ULTRA TEST) test strip USE TO TEST BLOOD GLUCOSE 2 TIMES DAILY AS DIRECTED 100 each 0   levonorgestrel (MIRENA) 20 MCG/24HR IUD 1 each by Intrauterine  route once.     losartan (COZAAR) 100 MG tablet Take 1 tablet (100 mg total) by mouth daily. 90 tablet 0   metFORMIN (GLUCOPHAGE) 1000 MG tablet TAKE 1 TABLET (1,000 MG TOTAL) BY MOUTH 2 (TWO) TIMES DAILY WITH A MEAL. 180 tablet 1   metoprolol succinate (TOPROL-XL) 50 MG 24 hr tablet Take 1 tablet (50 mg total) by mouth daily. Take with or immediately following a meal. 14 tablet 0   OneTouch Delica Lancets 03U MISC USE TO TEST BLOOD GLUCOSE UP TO 2 TIMES DAILY AS DIRECTED 100 each 0   Polyethyl Glycol-Propyl Glycol (SYSTANE) 0.4-0.3 % SOLN Apply to eye.     Vitamin D, Cholecalciferol, 50 MCG (2000 UT) CAPS Take 1 capsule by mouth daily.     Current Facility-Administered Medications  Medication Dose Route Frequency Provider Last Rate Last Dose   levonorgestrel (MIRENA) 20 MCG/24HR IUD   Intrauterine Once [name], [name], MD        Allergies:   Patient has no known allergies.     ROS:  Please see the history of present illness.   Otherwise, review of systems are positive for none.   All other systems are reviewed and negative.    PHYSICAL EXAM: VS:  BP 136/88    Pulse (!) 57  Temp (!) 97 F (36.1 C)    Ht 5' 5.5" (1.664 m)    Wt 287 lb (130.2 kg)    SpO2 100%    BMI 47.03 kg/m  , BMI Body mass index is 47.03 kg/m. GENERAL:  Well appearing NECK:  No jugular venous distention, waveform within normal limits, carotid upstroke brisk and symmetric, no bruits, no thyromegaly LUNGS:  Clear to auscultation bilaterally CHEST:  Unremarkable HEART:  PMI not displaced or sustained,S1 and S2 within normal limits, no S3, no S4, no clicks, no rubs, 2 out of 6 left upper sternal border slight systolic murmur heard also at the right upper sternal border and no change with Valsalva, no diastolic murmurs ABD:  Flat, positive bowel sounds normal in frequency in pitch, no bruits, no rebound, no guarding, no midline pulsatile mass, no hepatomegaly, no splenomegaly EXT:  2 plus pulses throughout,  mild bilateral edema, no cyanosis no clubbing    EKG:  EKG is  ordered today. Sinus rhythm, rate 57, axis within normal limits, intervals within normal limits, no acute ST-T wave changes.   Recent Labs: 12/16/2018: ALT 38; BUN 22; Creatinine, Ser 0.63; Potassium 4.1; Sodium 142    Lipid Panel    Component Value Date/Time   CHOL 133 12/16/2018 0817   TRIG 69 12/16/2018 0817   HDL 53 12/16/2018 0817   CHOLHDL 3 09/13/2016 0958   VLDL 21.8 09/13/2016 0958   LDLCALC 66 12/16/2018 0817      Wt Readings from Last 3 Encounters:  01/01/19 287 lb (130.2 kg)  12/16/18 288 lb (130.6 kg)  11/25/18 284 lb (128.8 kg)      Other studies Reviewed: Additional studies/ records that were reviewed today include: Labs Review of the above records demonstrates:   See below   ASSESSMENT AND PLAN:  ACUTE ON CHRONIC DIASTOLIC DYSFUNCTION:    She seems to be euvolemic. No change in therapies and no further imaging is indicated.  SLEEP APNEA:   She had sleep apnea treated with CPAP.  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  MURMUR:    She had a normal echo. No further imaging. I do not appreciate this today.  HTN:   Her blood pressure is well controlled. She will continue the meds as listed.  DM:  A1c was down to 5.7 7.2.  This is improved I will defer to Billie Ruddy, MD  Current medicines are reviewed at length with the patient today.  The patient does not have concerns regarding medicines.  The following changes have been made:  None  Labs/ tests ordered today include:   None   Orders Placed This Encounter  Procedures   EKG 12-Lead     Disposition:   FU with me in 12 months.     Signed, Minus Breeding, MD  01/01/2019 8:42 AM    [facility]

## 2019-01-01 ENCOUNTER — Encounter: Payer: Self-pay | Admitting: Cardiology

## 2019-01-01 ENCOUNTER — Ambulatory Visit: Payer: BC Managed Care – PPO | Admitting: Cardiology

## 2019-01-01 ENCOUNTER — Other Ambulatory Visit: Payer: Self-pay

## 2019-01-01 VITALS — BP 136/88 | HR 57 | Temp 97.0°F | Ht 65.5 in | Wt 287.0 lb

## 2019-01-01 DIAGNOSIS — E118 Type 2 diabetes mellitus with unspecified complications: Secondary | ICD-10-CM

## 2019-01-01 DIAGNOSIS — I5033 Acute on chronic diastolic (congestive) heart failure: Secondary | ICD-10-CM | POA: Diagnosis not present

## 2019-01-01 DIAGNOSIS — G4730 Sleep apnea, unspecified: Secondary | ICD-10-CM

## 2019-01-01 DIAGNOSIS — I10 Essential (primary) hypertension: Secondary | ICD-10-CM | POA: Diagnosis not present

## 2019-01-01 DIAGNOSIS — R011 Cardiac murmur, unspecified: Secondary | ICD-10-CM

## 2019-01-01 NOTE — Patient Instructions (Signed)
Medication Instructions:  Your physician recommends that you continue on your current medications as directed. Please refer to the Current Medication list given to you today.  If you need a refill on your cardiac medications before your next appointment, please call your pharmacy.   Lab work: NONE  Testing/Procedures: NONE  Follow-Up: At [facility], you and your health needs are our priority.  As part of our continuing mission to provide you with exceptional heart care, we have created designated Provider Care Teams.  These Care Teams include your primary Cardiologist (physician) and Advanced Practice Providers (APPs -  Physician Assistants and Nurse Practitioners) who all work together to provide you with the care you need, when you need it. You may see Dr Hochrein or one of the following Advanced Practice Providers on your designated Care Team:    Rhonda Barrett, PA-C  Kathryn Lawrence, DNP, ANP  Cadence Furth, NP  Your physician wants you to follow-up in: 1 year. You will receive a reminder letter in the mail two months in advance. If you don't receive a letter, please call our office to schedule the follow-up appointment.       

## 2019-01-04 ENCOUNTER — Other Ambulatory Visit: Payer: Self-pay

## 2019-01-04 ENCOUNTER — Encounter (INDEPENDENT_AMBULATORY_CARE_PROVIDER_SITE_OTHER): Payer: Self-pay | Admitting: Family Medicine

## 2019-01-04 DIAGNOSIS — I10 Essential (primary) hypertension: Secondary | ICD-10-CM

## 2019-01-04 DIAGNOSIS — E119 Type 2 diabetes mellitus without complications: Secondary | ICD-10-CM

## 2019-01-04 NOTE — Telephone Encounter (Signed)
Would you like for her to schedule a virtual appointment  sooner or leave it for 12/14?

## 2019-01-05 ENCOUNTER — Ambulatory Visit (INDEPENDENT_AMBULATORY_CARE_PROVIDER_SITE_OTHER): Payer: BC Managed Care – PPO | Admitting: Gynecology

## 2019-01-05 ENCOUNTER — Encounter: Payer: Self-pay | Admitting: Gynecology

## 2019-01-05 ENCOUNTER — Other Ambulatory Visit: Payer: Self-pay

## 2019-01-05 VITALS — BP 134/82

## 2019-01-05 DIAGNOSIS — N841 Polyp of cervix uteri: Secondary | ICD-10-CM

## 2019-01-05 DIAGNOSIS — Z30433 Encounter for removal and reinsertion of intrauterine contraceptive device: Secondary | ICD-10-CM

## 2019-01-05 HISTORY — PX: INTRAUTERINE DEVICE INSERTION: SHX323

## 2019-01-05 MED ORDER — METOPROLOL SUCCINATE ER 50 MG PO TB24: 50.0000 mg | ORAL_TABLET | Freq: Every day | ORAL | 0 refills | Status: DC

## 2019-01-05 MED ORDER — LOSARTAN POTASSIUM 100 MG PO TABS: 100.0000 mg | ORAL_TABLET | Freq: Every day | ORAL | 0 refills | Status: DC

## 2019-01-05 MED ORDER — GLUCOSE BLOOD VI STRP: ORAL_STRIP | 12 refills | Status: DC

## 2019-01-05 NOTE — Progress Notes (Signed)
    [name] [facility] 1965-05-05 [id number]        53 y.o.  G2P2  presents for Mirena IUD replacement. She has read through the booklet, has no contraindications and signed the consent form.  I reviewed the removal and insertional process with her as well as the risks to include infection, either immediate or long-term, uterine perforation or migration requiring surgery to remove, other complications such as pain, hormonal side effects and possibility of failure with subsequent pregnancy.   Exam with Caryn Bee assistant Vitals:   01/05/19 1555  BP: 134/82    Pelvic: External BUS vagina normal. Cervix normal with endocervical polyp extruding from the os.  IUD string visualized. Uterus anteverted normal size shape contour midline mobile nontender. Adnexa without masses or tenderness.  Procedure: The cervix was visualized with a speculum and the old Mirena IUD string was grasped with a [place] forcep, the IUD removed, shown to the patient and discarded.  The endocervical polyp was then removed using a ring forcep and sent to pathology.  The cervix was then cleansed with Betadine, anterior lip grasped with a single-tooth tenaculum, the uterus was sounded and a Mirena IUD was placed according to manufacturer's recommendations without difficulty. The strings were trimmed. The patient tolerated well and will follow up in one month for a postinsertional check.  You also follow-up for the polyp pathology.  Recent Pap smear/HPV was negative.  Lot number:  [id number]   Anastasio Auerbach MD, 4:24 PM 01/05/2019

## 2019-01-05 NOTE — Addendum Note (Signed)
Addended by: Nelva Nay on: 01/05/2019 04:35 PM   Modules accepted: Orders

## 2019-01-05 NOTE — Patient Instructions (Signed)

## 2019-01-06 ENCOUNTER — Other Ambulatory Visit (INDEPENDENT_AMBULATORY_CARE_PROVIDER_SITE_OTHER): Payer: Self-pay | Admitting: Family Medicine

## 2019-01-06 ENCOUNTER — Other Ambulatory Visit (HOSPITAL_COMMUNITY): Payer: Self-pay | Admitting: Gynecology

## 2019-01-06 ENCOUNTER — Ambulatory Visit: Payer: BC Managed Care – PPO | Admitting: Gynecology

## 2019-01-06 DIAGNOSIS — I10 Essential (primary) hypertension: Secondary | ICD-10-CM

## 2019-01-06 DIAGNOSIS — Z1231 Encounter for screening mammogram for malignant neoplasm of breast: Secondary | ICD-10-CM

## 2019-01-08 LAB — TISSUE SPECIMEN

## 2019-01-08 LAB — PATHOLOGY REPORT

## 2019-01-11 ENCOUNTER — Ambulatory Visit (INDEPENDENT_AMBULATORY_CARE_PROVIDER_SITE_OTHER): Payer: BC Managed Care – PPO | Admitting: Family Medicine

## 2019-01-11 ENCOUNTER — Encounter: Payer: Self-pay | Admitting: Gynecology

## 2019-01-13 ENCOUNTER — Other Ambulatory Visit: Payer: Self-pay

## 2019-01-13 ENCOUNTER — Ambulatory Visit (HOSPITAL_COMMUNITY)
Admission: RE | Admit: 2019-01-13 | Discharge: 2019-01-13 | Disposition: A | Discharge status: Home / Self Care | Payer: BC Managed Care – PPO | Source: Ambulatory Visit | Attending: Gynecology | Admitting: Gynecology

## 2019-01-13 ENCOUNTER — Other Ambulatory Visit (INDEPENDENT_AMBULATORY_CARE_PROVIDER_SITE_OTHER): Payer: Self-pay | Admitting: Family Medicine

## 2019-01-13 DIAGNOSIS — E119 Type 2 diabetes mellitus without complications: Secondary | ICD-10-CM

## 2019-01-13 DIAGNOSIS — Z1231 Encounter for screening mammogram for malignant neoplasm of breast: Secondary | ICD-10-CM | POA: Insufficient documentation

## 2019-01-14 MED ORDER — GLUCOSE BLOOD VI STRP: ORAL_STRIP | 12 refills | Status: DC

## 2019-01-14 MED ORDER — ONETOUCH DELICA LANCETS 33G MISC: 0 refills | Status: DC

## 2019-01-25 ENCOUNTER — Ambulatory Visit (INDEPENDENT_AMBULATORY_CARE_PROVIDER_SITE_OTHER): Payer: BC Managed Care – PPO | Admitting: Family Medicine

## 2019-02-02 ENCOUNTER — Other Ambulatory Visit: Payer: Self-pay

## 2019-02-03 ENCOUNTER — Encounter: Payer: Self-pay | Admitting: Gynecology

## 2019-02-03 ENCOUNTER — Ambulatory Visit: Payer: BC Managed Care – PPO | Admitting: Gynecology

## 2019-02-03 VITALS — BP 130/82

## 2019-02-03 DIAGNOSIS — Z30431 Encounter for routine checking of intrauterine contraceptive device: Secondary | ICD-10-CM | POA: Diagnosis not present

## 2019-02-03 NOTE — Patient Instructions (Signed)
Follow-up September 2021 when due for annual exam.  Sooner if any issues.

## 2019-02-03 NOTE — Progress Notes (Signed)
    [name] [facility] Jun 19, 1965 [id number]        53 y.o.  G2P2 presents for IUD follow-up exam.  Is doing well without complaints.  Had Mirena replaced 01/05/2019  Past medical history,surgical history, problem list, medications, allergies, family history and social history were all reviewed and documented in the EPIC chart.  Directed ROS with pertinent positives and negatives documented in the history of present illness/assessment and plan.  Exam: Caryn Bee assistant Vitals:   02/03/19 1134  BP: 130/82   General appearance:  Normal Abdomen soft nontender without masses guarding rebound Pelvic external BUS vagina normal.  Cervix normal with IUD string visualized and appropriate length.  Uterus grossly normal midline mobile nontender.  Adnexa without masses or tenderness.  Assessment/Plan:  53 y.o. G2P2 with normal follow-up IUD check.  Patient will follow-up September 2021 when due for annual exam, sooner as needed    Anastasio Auerbach MD, 12:16 PM 02/03/2019

## 2019-02-07 NOTE — Telephone Encounter (Signed)
I believe this was done, we should obtain a follow-up download for reassessment and possible adjustment if needed

## 2019-02-15 ENCOUNTER — Ambulatory Visit: Payer: BC Managed Care – PPO | Admitting: Family Medicine

## 2019-02-17 ENCOUNTER — Telehealth (INDEPENDENT_AMBULATORY_CARE_PROVIDER_SITE_OTHER): Payer: BC Managed Care – PPO | Admitting: Family Medicine

## 2019-02-17 DIAGNOSIS — E6609 Other obesity due to excess calories: Secondary | ICD-10-CM | POA: Diagnosis not present

## 2019-02-17 DIAGNOSIS — E119 Type 2 diabetes mellitus without complications: Secondary | ICD-10-CM | POA: Diagnosis not present

## 2019-02-17 DIAGNOSIS — I10 Essential (primary) hypertension: Secondary | ICD-10-CM | POA: Diagnosis not present

## 2019-02-17 DIAGNOSIS — I509 Heart failure, unspecified: Secondary | ICD-10-CM | POA: Diagnosis not present

## 2019-02-17 MED ORDER — FUROSEMIDE 40 MG PO TABS: 40.0000 mg | ORAL_TABLET | Freq: Every day | ORAL | 2 refills | Status: DC

## 2019-02-17 NOTE — Progress Notes (Signed)
Virtual Visit via Video Note  I connected with Daryle Boyington on 02/17/19 at  1:00 PM EST by a video enabled telemedicine application 2/2 [id number] pandemic and verified that I am speaking with the correct person using two identifiers.  Location patient: home Location provider:work or home office Persons participating in the virtual visit: patient, provider  I discussed the limitations of evaluation and management by telemedicine and the availability of in person appointments. The patient expressed understanding and agreed to proceed.   HPI: Pt is a pleasant 55 yo female with pmh sig for diastolic CHF, DM, GERD, HLD, HTN, obesity, OSA seen for f/u.  Pt states she has been doing well overall.  States "fell off" during the holidays, gained ~12 lbs.  BP ok.  Eating slightly more salt due to the holidays.  FSBS in the 90s-low 100s.  At times has a snack instead of a large meal for dinner.  Pt has not been to Weight management office in a while.  Plans to return after gets "back on track".  Pt denies symptoms of  Hypoglycemia.    ROS: See pertinent positives and negatives per HPI.  Past Medical History:  Diagnosis Date  . Chronic diastolic HF (heart failure) ([place])   . Diabetes ([place])   . GERD (gastroesophageal reflux disease)   . Hyperlipemia   . Hypertension   . Knee pain   . Obesity   . Sleep apnea   . Venous insufficiency     Past Surgical History:  Procedure Laterality Date  . Birth Elta [place] removed    . CHOLECYSTECTOMY    . INTRAUTERINE DEVICE INSERTION  01/05/2019   Mirena    Family History  Problem Relation Age of Onset  . Hypertension Father   . Diabetes Father   . Obesity Father   . Cancer Paternal Grandfather        Stomach  . Cancer Maternal Uncle        Pancreatic  . Cancer Paternal Uncle        Kidney  . Hypertension Mother   . Anxiety disorder Mother    Social hx: Pt is a Tourist information centre manager, teaching HS.  Pt notes the kids are scheduled to start in-person learning in a few  wks.  Pt experienced increased stress when her daughter tested positive for COVID several months ago, but is doing better now.   Current Outpatient Medications:  .  aspirin 81 MG EC tablet, TAKE 1 TABLET BY MOUTH EVERY DAY, Disp: 90 tablet, Rfl: 1 .  atorvastatin (LIPITOR) 20 MG tablet, TAKE 1 TABLET BY MOUTH EVERY DAY, Disp: 90 tablet, Rfl: 3 .  blood glucose meter kit and supplies, Dispense based on patient and insurance preference. Use up to twice daily as directed. (FOR ICD-10 E10.9, E11.9)., Disp: 1 each, Rfl: 0 .  furosemide (LASIX) 40 MG tablet, TAKE 1 TABLET BY MOUTH EVERY DAY, Disp: 90 tablet, Rfl: 1 .  glucose blood (ONE TOUCH ULTRA TEST) test strip, USE TO TEST BLOOD GLUCOSE 2 TIMES DAILY AS DIRECTED, Disp: 100 each, Rfl: 0 .  glucose blood test strip, Use to check blood sugar twice daily, Disp: 100 each, Rfl: 12 .  levonorgestrel (MIRENA) 20 MCG/24HR IUD, 1 each by Intrauterine route once., Disp: , Rfl:  .  losartan (COZAAR) 100 MG tablet, Take 1 tablet (100 mg total) by mouth daily., Disp: 90 tablet, Rfl: 0 .  metFORMIN (GLUCOPHAGE) 1000 MG tablet, TAKE 1 TABLET (1,000 MG TOTAL) BY MOUTH 2 (TWO) TIMES DAILY  WITH A MEAL., Disp: 180 tablet, Rfl: 1 .  metoprolol succinate (TOPROL-XL) 50 MG 24 hr tablet, Take 1 tablet (50 mg total) by mouth daily. Take with or immediately following a meal., Disp: 90 tablet, Rfl: 0 .  OneTouch Delica Lancets 14E MISC, USE TO TEST BLOOD GLUCOSE UP TO 2 TIMES DAILY AS DIRECTED, Disp: 100 each, Rfl: 0 .  Polyethyl Glycol-Propyl Glycol (SYSTANE) 0.4-0.3 % SOLN, Apply to eye., Disp: , Rfl:  .  Vitamin D, Cholecalciferol, 50 MCG (2000 UT) CAPS, Take 1 capsule by mouth daily., Disp: , Rfl:   Current Facility-Administered Medications:  .  levonorgestrel (MIRENA) 20 MCG/24HR IUD, , Intrauterine, Once, [name], [name], MD  EXAM:  VITALS per patient if applicable:  RR between 12-20 bpm  GENERAL: alert, oriented, appears well and in no acute  distress  HEENT: atraumatic, conjunctiva clear, no obvious abnormalities on inspection of external nose and ears  NECK: normal movements of the head and neck  LUNGS: on inspection no signs of respiratory distress, breathing rate appears normal, no obvious gross SOB, gasping or wheezing  CV: no obvious cyanosis  MS: moves all visible extremities without noticeable abnormality  PSYCH/NEURO: pleasant and cooperative, no obvious depression or anxiety, speech and thought processing grossly intact  ASSESSMENT AND PLAN:  Discussed the following assessment and plan:  Essential hypertension -controlled -continue current meds losartan 100 mg, toprol XL 50 mg, and lasix 40 mg -continue lifestyle modifications  Type 2 diabetes mellitus without complication, without long-term current use of insulin (HCC) -controlled -hgb A1C 5.7% on 12/16/18 -continue metformin 1000 mg BID -continue lifestyle modifications including walking for exercise.  Congestive heart failure, unspecified HF chronicity, unspecified heart failure type (HCC) -stable -lasix 40 mg refilled. -continue Toprol XL 50 mg daily, losartan 100 mg daily -discussed lifestyle modifications, reducing sodium intake -continue f/u with Cardiology, Dr. Percival Spanish  Obesity -discussed ways to get back on track with weight loss.  Pt is motivated. -f/u with Weight management when able -Will refill pt's diabetic supplies and other meds if needed.  F/u in 3-4 months as needed   I discussed the assessment and treatment plan with the patient. The patient was provided an opportunity to ask questions and all were answered. The patient agreed with the plan and demonstrated an understanding of the instructions.   The patient was advised to call back or seek an in-person evaluation if the symptoms worsen or if the condition fails to improve as anticipated.   Billie Ruddy, MD

## 2019-02-19 NOTE — Telephone Encounter (Signed)
Returned a call to patient. During the conversation it was discovered that the patient has never been seen in the office by Dr Tresa Endo for a compliance visit. Appointment scheduled for her to be seen by him on 02/22/19. Pressure issues will be addressed at this time.

## 2019-02-22 ENCOUNTER — Other Ambulatory Visit: Payer: Self-pay

## 2019-02-22 ENCOUNTER — Ambulatory Visit: Payer: BC Managed Care – PPO | Admitting: Cardiovascular Disease

## 2019-02-22 VITALS — BP 157/83 | HR 61 | Temp 97.0°F | Ht 66.0 in | Wt 302.8 lb

## 2019-02-22 DIAGNOSIS — G4733 Obstructive sleep apnea (adult) (pediatric): Secondary | ICD-10-CM | POA: Diagnosis not present

## 2019-02-22 DIAGNOSIS — R011 Cardiac murmur, unspecified: Secondary | ICD-10-CM

## 2019-02-22 DIAGNOSIS — E118 Type 2 diabetes mellitus with unspecified complications: Secondary | ICD-10-CM | POA: Diagnosis not present

## 2019-02-22 DIAGNOSIS — I5032 Chronic diastolic (congestive) heart failure: Secondary | ICD-10-CM

## 2019-02-22 NOTE — Patient Instructions (Signed)
Medication Instructions:  Your physician recommends that you continue on your current medications as directed. Please refer to the Current Medication list given to you today.  If you need a refill on your cardiac medications before your next appointment, please call your pharmacy.   Lab work: NONE  Testing/Procedures: NONE  Follow-Up: At [facility], you and your health needs are our priority.  As part of our continuing mission to provide you with exceptional heart care, we have created designated Provider Care Teams.  These Care Teams include your primary Cardiologist (physician) and Advanced Practice Providers (APPs -  Physician Assistants and Nurse Practitioners) who all work together to provide you with the care you need, when you need it. You may see Dr Tresa Endo or one of the following Advanced Practice Providers on your designated Care Team:    Azalee Course, PA-C  Micah Flesher, [place] or   Judy Pimple, [place]  Your physician wants you to follow-up in: 6 months with Dr. Tresa Endo  Any Other Special Instructions Will Be Listed Below (If Applicable). A download will be looked at in 1 month for your sleep study.

## 2019-02-24 ENCOUNTER — Encounter: Payer: Self-pay | Admitting: Cardiovascular Disease

## 2019-02-24 NOTE — Progress Notes (Signed)
Cardiology Office Note    Date:  02/24/2019   ID:  [name], DOB 12-01-1965, MRN [id number]  PCP:  Billie Ruddy, MD  Cardiologist:  Shelva Majestic, MD (sleep); Dr. Percival Spanish  New Sleep evaluaiton  History of Present Illness:  [name] is a 54 y.o. female who is followed by Dr. Percival Spanish for cardiology care.  She has a history of diastolic heart failure and had been hospitalized in February 2019 with CHF.  She states that she has a longstanding history of significant obesity and had a peak weight in excess of 400 pounds.  Over the last 2 years, she has lost approximately 100 pounds.  Due to concerns for obstructive sleep apnea with symptoms of snoring, nonrestorative sleep, nocturia 3-4 times per night, and daytime sleepiness she ultimately was referred for a sleep study in May 2019.  This revealed very severe sleep apnea with an overall AHI at 97.6/h.  She was unable to achieve any REM sleep.  She had severe oxygen desaturation to a nadir of 64%.  She subsequently underwent CPAP titration due to continued events after CPAP titrated to 18 cm she was transitioned to BiPAP therapy and was titrated to a maximum of 25/15.  Optimal pressure was felt to be 23/15.  She also required supplemental oxygenation and was treated with 3 L/min due to continued low oxygen saturation.  She received a Respironics dream station auto BiPAP unit with set up date November 03, 2017 with [employer] as her DME company.  Apparently, the patient never had a compliance visit with a sleep physician but had seen her cardiologist within the 90-day window.  She had recently called the office and was added onto my schedule for a sleep evaluation.  [name] has noticed dramatic benefit since initiating CPAP therapy.  Sleep she had severe daytime sleepiness and this has essentially resolved.  A new Epworth Sleepiness Scale score was calculated in the office today and this endorsed at 1.  She is unaware of  breakthrough snoring.  Her previous 3-4 time nocturia has significantly improved to approximately 1 time per night.  A download obtained in her current mode settings have shown she is not on auto but has been on a fixed EPAP pressure of 8 and IPAP pressure of 16 with a biflex setting of 3.  She apparently has been on a ramp time of 45 minutes with a ramp start pressure of 5.  She is 100% compliant on download from December 12 through February 21, 2019 with 100% use.  Average usage was 6 hours and 50 minutes.  AHI was 5.4 with her 16/8 pressure.  She continues to use 3 L of oxygen and may need new oxygen equipment.  She has been using a full facemask but has noticed some nasal irritation and some leak.  She presents for sleep evaluation.  At present, she denies chest pain.  She is unaware of palpitations.  Her shortness of breath has improved.  She is unaware of breakthrough snoring.  She denies restless legs or bruxism.  She denies any hypnagogic hallucinations or cataplectic events.  Past Medical History:  Diagnosis Date  . Chronic diastolic HF (heart failure) ([place])   . Diabetes ([place])   . GERD (gastroesophageal reflux disease)   . Hyperlipemia   . Hypertension   . Knee pain   . Obesity   . Sleep apnea   . Venous insufficiency     Past Surgical History:  Procedure Laterality Date  .  Birth Elta [place] removed    . CHOLECYSTECTOMY    . INTRAUTERINE DEVICE INSERTION  01/05/2019   Mirena    Current Medications: Outpatient Medications Prior to Visit  Medication Sig Dispense Refill  . aspirin 81 MG EC tablet TAKE 1 TABLET BY MOUTH EVERY DAY 90 tablet 1  . atorvastatin (LIPITOR) 20 MG tablet TAKE 1 TABLET BY MOUTH EVERY DAY 90 tablet 3  . blood glucose meter kit and supplies Dispense based on patient and insurance preference. Use up to twice daily as directed. (FOR ICD-10 E10.9, E11.9). 1 each 0  . furosemide (LASIX) 40 MG tablet Take 1 tablet (40 mg total) by mouth daily. 90 tablet 2  . glucose  blood (ONE TOUCH ULTRA TEST) test strip USE TO TEST BLOOD GLUCOSE 2 TIMES DAILY AS DIRECTED 100 each 0  . glucose blood test strip Use to check blood sugar twice daily 100 each 12  . levonorgestrel (MIRENA) 20 MCG/24HR IUD 1 each by Intrauterine route once.    [name] losartan (COZAAR) 100 MG tablet Take 1 tablet (100 mg total) by mouth daily. 90 tablet 0  . metFORMIN (GLUCOPHAGE) 1000 MG tablet TAKE 1 TABLET (1,000 MG TOTAL) BY MOUTH 2 (TWO) TIMES DAILY WITH A MEAL. 180 tablet 1  . metoprolol succinate (TOPROL-XL) 50 MG 24 hr tablet Take 1 tablet (50 mg total) by mouth daily. Take with or immediately following a meal. 90 tablet 0  . OneTouch Delica Lancets 54Y MISC USE TO TEST BLOOD GLUCOSE UP TO 2 TIMES DAILY AS DIRECTED 100 each 0  . Polyethyl Glycol-Propyl Glycol (SYSTANE) 0.4-0.3 % SOLN Apply to eye.    . Vitamin D, Cholecalciferol, 50 MCG (2000 UT) CAPS Take 1 capsule by mouth daily.     Facility-Administered Medications Prior to Visit  Medication Dose Route Frequency Provider Last Rate Last Admin  . levonorgestrel (MIRENA) 20 MCG/24HR IUD   Intrauterine Once [name], [name], MD         Allergies:   Patient has no known allergies.   Social History   Socioeconomic History  . Marital status: Married    Spouse name: Miamor Ayler  . Number of children: 2  . Years of education: Not on file  . Highest education level: Not on file  Occupational History  . Occupation: Educator  Tobacco Use  . Smoking status: Never Smoker  . Smokeless tobacco: Never Used  Substance and Sexual Activity  . Alcohol use: Yes    Alcohol/week: 0.0 standard drinks    Comment: Rare  . Drug use: No  . Sexual activity: Yes    Birth control/protection: I.U.D.    Comment: Mirena 01/05/2019-1st intercourse 54 yo-Fewer than 5 partners  Other Topics Concern  . 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 Difficulty of Paying Living Expenses: Not on file  Food Insecurity:   . Worried About Charity fundraiser in the Last Year: Not on file  . Ran Out of Food in the Last Year: Not on file  Transportation Needs:   . Lack of Transportation (Medical): Not on file  .  Lack of Transportation (Non-Medical): Not on file  Physical Activity:   . Days of Exercise per Week: Not on file  . Minutes of Exercise per Session: Not on file  Stress:   . Feeling of Stress : Not on file  Social Connections:   . Frequency of Communication with Friends and Family: Not on file  . Frequency of Social Gatherings with Friends and Family: Not on file  . Attends Religious Services: Not on file  . Active Member of Clubs or Organizations: Not on file  . Attends Archivist Meetings: Not on file  . Marital Status: Not on file    Socially she is married has 2 children ages 32 and 63.  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  She had gone to [employer] for college.  Family History:  The patient's family history includes Anxiety disorder in her mother; Cancer in her maternal uncle, paternal grandfather, and paternal uncle; Diabetes in her father; Hypertension in her father and mother; Obesity in her father.   Her father died at age 44 with sepsis.  Her mother is living at age 75.  ROS General: Negative; No fevers, chills, or night sweats; super morbid obesity HEENT: Negative; No changes in vision or hearing, sinus congestion, difficulty swallowing Pulmonary: Negative; No cough, wheezing, shortness of breath, hemoptysis Cardiovascular: Tree of diastolic heart failure GI: Negative; No nausea, vomiting, diarrhea, or abdominal pain GU: Negative; No dysuria, hematuria, or difficulty voiding Musculoskeletal:  Negative; no myalgias, joint pain, or weakness Hematologic/Oncology: Negative; no easy bruising, bleeding Endocrine: Negative; no heat/cold intolerance; no diabetes Neuro: Negative; no changes in balance, headaches Skin: Negative; No rashes or skin lesions Psychiatric: Negative; No behavioral problems, depression Sleep: See HPI Other comprehensive 14 point system review is negative.   PHYSICAL EXAM:   VS:  BP (!) 157/83   Pulse 61   Temp (!) 97 F (36.1 C)   Ht '5\' 6"'  (1.676 m)   Wt (!) 302 lb 12.8 oz (137.3 kg)   SpO2 100%   BMI 48.87 kg/m     Repeat blood pressure by me was 136/80  Wt Readings from Last 3 Encounters:  02/22/19 (!) 302 lb 12.8 oz (137.3 kg)  01/01/19 287 lb (130.2 kg)  12/16/18 288 lb (130.6 kg)    General: Alert, oriented, no distress.  Skin: normal turgor, no rashes, warm and dry HEENT: Normocephalic, atraumatic. Pupils equal round and reactive to light; sclera anicteric; extraocular muscles intact;  Nose without nasal septal hypertrophy Mouth/Parynx benign; Mallinpatti scale 3 Neck: No JVD, no carotid bruits; normal carotid upstroke Lungs: clear to ausculatation and percussion; no wheezing or rales Chest wall: without tenderness to palpitation Heart: PMI not displaced, RRR, s1 s2 normal, 4-6/5 systolic murmur, no diastolic murmur, no rubs, gallops, thrills, or heaves Abdomen: Significant central adiposity;soft, nontender; no hepatosplenomehaly, BS+; abdominal aorta nontender and not dilated by palpation. Back: no CVA tenderness Pulses 2+ Musculoskeletal: full range of motion, normal strength, no joint deformities Extremities: no clubbing cyanosis or edema, Homan's sign negative  Neurologic: grossly nonfocal; Cranial nerves grossly wnl Psychologic: Normal mood and affect   Studies/Labs Reviewed:   January 04, 2019 EKG (Independently reviewed by me): Sinus bradycardia 57.  No significant ST changes.  Normal intervals.  Recent Labs: BMP Latest Ref  Rng & Units 12/16/2018 08/10/2018 02/12/2018  Glucose 65 - 99 mg/dL 92 86 91  BUN 6 - 24 mg/dL '22 23 15  ' Creatinine 0.57 - 1.00 mg/dL 0.63 0.54(L) 0.56(L)  BUN/Creat Ratio 9 - 23 35(H) 43(H) 27(H)  Sodium 134 - 144 mmol/L 142 144 140  Potassium 3.5 - 5.2 mmol/L 4.1 4.0 3.7  Chloride 96 - 106 mmol/L 102 104 99  CO2 20 - 29 mmol/L '24 25 25  ' Calcium 8.7 - 10.2 mg/dL 9.4 9.5 9.6     Hepatic Function Latest Ref Rng & Units 12/16/2018 08/10/2018 02/12/2018  Total Protein 6.0 - 8.5 g/dL 7.1 7.2 7.5  Albumin 3.8 - 4.9 g/dL 4.4 4.6 4.5  AST 0 - 40 IU/L '21 22 30  ' ALT 0 - 32 IU/L 38(H) 37(H) 65(H)  Alk Phosphatase 39 - 117 IU/L 140(H) 148(H) 161(H)  Total Bilirubin 0.0 - 1.2 mg/dL 0.4 0.5 0.7    CBC Latest Ref Rng & Units 10/20/2017 04/05/2017 04/03/2017  WBC 3.4 - 10.8 x10E3/uL 7.9 10.6(H) -  Hemoglobin 11.1 - 15.9 g/dL 15.0 14.1 16.3(H)  Hematocrit 34.0 - 46.6 % 48.0(H) 48.0(H) 48.0(H)  Platelets 150 - 400 K/uL - 265 -   Lab Results  Component Value Date   MCV 90 10/20/2017   MCV 93.0 04/05/2017   MCV 91.0 04/03/2017   Lab Results  Component Value Date   TSH 2.790 10/20/2017   Lab Results  Component Value Date   HGBA1C 5.7 (H) 12/16/2018     BNP    Component Value Date/Time   BNP 90.0 04/03/2017 1331    ProBNP No results found for: PROBNP   Lipid Panel     Component Value Date/Time   CHOL 133 12/16/2018 0817   TRIG 69 12/16/2018 0817   HDL 53 12/16/2018 0817   CHOLHDL 3 09/13/2016 0958   VLDL 21.8 09/13/2016 0958   LDLCALC 66 12/16/2018 0817   LABVLDL 14 12/16/2018 0817     RADIOLOGY: No results found.   Additional studies/ records that were reviewed today include:  I reviewed the records of Dr. Percival Spanish. I have reviewed both her diagnostic polysomnogram as well as CPAP/BiPAP titration study A download was obtained in the office and reviewed in detail with the patient New Epworth Sleepiness Scale score was calculated   ASSESSMENT:    1. Obstructive sleep  apnea (adult) (pediatric) on BiPAP   2. Chronic diastolic heart failure ([place])   3. Murmur   4. Type 2 diabetes mellitus with complication, without long-term current use of insulin ([place])   5. Morbid obesity [facility])      PLAN:  [name] is a 54 year old female who has a history of super morbid obesity with peak weight greater than 400 pounds as well as a history of diastolic dysfunction with diastolic heart failure.  She was found to have very severe obstructive sleep apnea on her initial polysomnogram in May 2019 with an AHI of 97.6/h and severe oxygen desaturation to a nadir of 64%.  She required supplemental oxygen during her CPAP/BiPAP titration.  Apparently she had never seen me in follow-up but I just seen Dr. Percival Spanish within the 90-day window.  I had a long discussion with her today regarding the severity of her sleep apnea.  I discussed normal sleep architecture as well as the adverse consequences of sleep apnea on normal sleep architecture.  I discussed the cardiovascular consequences of untreated sleep apnea with reference to hypertension, potential for nocturnal arrhythmias, nocturnal ischemia, GERD, effects on glucose, and with reference to her heart failure issues.  On her initial diagnostic study she was unable to achieve any REM sleep and did not even sleep supine.  She  has now been on BiPAP therapy but appears to be on a set pressure of 16/8.  On her titration it was recommended her optimal pressure to be 23/15.  Her most recent download shows an AHI of 5.4 at her 16/8 pressure.  Presently, I am making changes to her DreamStation auto BiPAP unit and will change her ramp time from 45 minutes down to 25 minutes.  I will increase her ramp start pressure to 7.  I am changing her EPAP pressure to 10 with a minimum pressure support of 6 and maximum pressure support of 8.  Her maximum IPAP pressure will be 25.  She has had some discomfort as a result of the mask which she is currently using.   I discussed new mask technology and feel that she would benefit from changing to a ResMed air fit F 30i mask where the tubing will come from the crown of her head and the nasal portion of her full facemask will be under her nose and not on the bridge of her nose.  I discussed the importance of weight loss and continued exercise.  She does have issues with her knee reducing her activity level.  I am recommending that a new download be obtained in 1 month.  Adjustments will be made if necessary.  I will see her in 6 months for follow-up evaluation or sooner as needed.   Medication Adjustments/Labs and Tests Ordered: Current medicines are reviewed at length with the patient today.  Concerns regarding medicines are outlined above.  Medication changes, Labs and Tests ordered today are listed in the Patient Instructions below. Patient Instructions  Medication Instructions:  Your physician recommends that you continue on your current medications as directed. Please refer to the Current Medication list given to you today.  If you need a refill on your cardiac medications before your next appointment, please call your pharmacy.   Lab work: NONE  Testing/Procedures: NONE  Follow-Up: At [facility], you and your health needs are our priority.  As part of our continuing mission to provide you with exceptional heart care, we have created designated Provider Care Teams.  These Care Teams include your primary Cardiologist (physician) and Advanced Practice Providers (APPs -  Physician Assistants and Nurse Practitioners) who all work together to provide you with the care you need, when you need it. You may see Dr Claiborne Billings or one of the following Advanced Practice Providers on your designated Care Team:    Almyra Deforest, PA-C  Fabian Sharp, [place] or   Roby Lofts, [place]  Your physician wants you to follow-up in: 6 months with Dr. Claiborne Billings  Any Other Special Instructions Will Be Listed Below (If Applicable). A  download will be looked at in 1 month for your sleep study.          Signed, Shelva Majestic, MD  02/24/2019 6:53 PM    [facility] [name] [place], [place], [place]  [address] Phone: [phone]

## 2019-03-28 ENCOUNTER — Encounter: Payer: Self-pay | Admitting: Family Medicine

## 2019-03-30 ENCOUNTER — Other Ambulatory Visit: Payer: Self-pay

## 2019-03-30 DIAGNOSIS — I10 Essential (primary) hypertension: Secondary | ICD-10-CM

## 2019-03-30 MED ORDER — LOSARTAN POTASSIUM 100 MG PO TABS: 100.0000 mg | ORAL_TABLET | Freq: Every day | ORAL | 0 refills | Status: DC

## 2019-04-04 ENCOUNTER — Encounter: Payer: Self-pay | Admitting: Family Medicine

## 2019-04-05 ENCOUNTER — Other Ambulatory Visit (INDEPENDENT_AMBULATORY_CARE_PROVIDER_SITE_OTHER): Payer: Self-pay | Admitting: Family Medicine

## 2019-04-05 DIAGNOSIS — I10 Essential (primary) hypertension: Secondary | ICD-10-CM

## 2019-04-05 DIAGNOSIS — E119 Type 2 diabetes mellitus without complications: Secondary | ICD-10-CM

## 2019-04-05 NOTE — Telephone Encounter (Signed)
Please advise 

## 2019-04-06 ENCOUNTER — Other Ambulatory Visit: Payer: Self-pay

## 2019-04-06 DIAGNOSIS — E119 Type 2 diabetes mellitus without complications: Secondary | ICD-10-CM

## 2019-04-06 DIAGNOSIS — I10 Essential (primary) hypertension: Secondary | ICD-10-CM

## 2019-04-06 MED ORDER — METOPROLOL SUCCINATE ER 50 MG PO TB24: 50.0000 mg | ORAL_TABLET | Freq: Every day | ORAL | 0 refills | Status: DC

## 2019-04-06 MED ORDER — ONETOUCH DELICA LANCETS 33G MISC: 0 refills | Status: DC

## 2019-05-05 ENCOUNTER — Other Ambulatory Visit: Payer: Self-pay | Admitting: Family Medicine

## 2019-05-11 ENCOUNTER — Other Ambulatory Visit: Payer: Self-pay | Admitting: Family Medicine

## 2019-06-09 ENCOUNTER — Other Ambulatory Visit: Payer: Self-pay

## 2019-06-09 MED ORDER — ONETOUCH ULTRA VI STRP: ORAL_STRIP | 1 refills | Status: DC

## 2019-06-09 NOTE — Telephone Encounter (Signed)
Rx sent to pt pharmacy for refill °

## 2019-06-20 ENCOUNTER — Other Ambulatory Visit: Payer: Self-pay | Admitting: Family Medicine

## 2019-07-05 ENCOUNTER — Other Ambulatory Visit: Payer: Self-pay | Admitting: Family Medicine

## 2019-07-05 DIAGNOSIS — I10 Essential (primary) hypertension: Secondary | ICD-10-CM

## 2019-07-05 DIAGNOSIS — E119 Type 2 diabetes mellitus without complications: Secondary | ICD-10-CM

## 2019-10-04 ENCOUNTER — Other Ambulatory Visit: Payer: Self-pay | Admitting: Family Medicine

## 2019-10-04 DIAGNOSIS — I10 Essential (primary) hypertension: Secondary | ICD-10-CM

## 2019-10-19 ENCOUNTER — Encounter (INDEPENDENT_AMBULATORY_CARE_PROVIDER_SITE_OTHER): Payer: Self-pay | Admitting: Family Medicine

## 2019-10-21 ENCOUNTER — Encounter: Payer: BC Managed Care – PPO | Admitting: Obstetrics and Gynecology

## 2019-10-21 ENCOUNTER — Other Ambulatory Visit: Payer: Self-pay | Admitting: Family Medicine

## 2019-10-21 DIAGNOSIS — E119 Type 2 diabetes mellitus without complications: Secondary | ICD-10-CM

## 2019-10-25 ENCOUNTER — Other Ambulatory Visit: Payer: Self-pay | Admitting: Family Medicine

## 2019-10-31 ENCOUNTER — Other Ambulatory Visit: Payer: Self-pay | Admitting: Family Medicine

## 2019-11-05 ENCOUNTER — Encounter: Payer: Self-pay | Admitting: Cardiovascular Disease

## 2019-11-05 ENCOUNTER — Ambulatory Visit: Payer: BC Managed Care – PPO | Admitting: Cardiovascular Disease

## 2019-11-05 ENCOUNTER — Other Ambulatory Visit: Payer: Self-pay

## 2019-11-05 VITALS — BP 130/78 | HR 83 | Temp 98.1°F | Ht 65.5 in | Wt 334.0 lb

## 2019-11-05 DIAGNOSIS — I10 Essential (primary) hypertension: Secondary | ICD-10-CM

## 2019-11-05 DIAGNOSIS — E785 Hyperlipidemia, unspecified: Secondary | ICD-10-CM

## 2019-11-05 DIAGNOSIS — E118 Type 2 diabetes mellitus with unspecified complications: Secondary | ICD-10-CM

## 2019-11-05 DIAGNOSIS — I5032 Chronic diastolic (congestive) heart failure: Secondary | ICD-10-CM | POA: Diagnosis not present

## 2019-11-05 DIAGNOSIS — G4733 Obstructive sleep apnea (adult) (pediatric): Secondary | ICD-10-CM

## 2019-11-05 NOTE — Patient Instructions (Signed)
  Follow-Up: At [facility], you and your health needs are our priority.  As part of our continuing mission to provide you with exceptional heart care, we have created designated Provider Care Teams.  These Care Teams include your primary Cardiologist (physician) and Advanced Practice Providers (APPs -  Physician Assistants and Nurse Practitioners) who all work together to provide you with the care you need, when you need it.  We recommend signing up for the patient portal called "MyChart".  Sign up information is provided on this After Visit Summary.  MyChart is used to connect with patients for Virtual Visits (Telemedicine).  Patients are able to view lab/test results, encounter notes, upcoming appointments, etc.  Non-urgent messages can be sent to your provider as well.   To learn more about what you can do with MyChart, go to [email or web address].    Your next appointment:   12 month(s)  The format for your next appointment:   In Person  Provider:   Thomas Kelly, MD    

## 2019-11-05 NOTE — Progress Notes (Signed)
Cardiology Office Note    Date:  11/05/2019   ID:  Wanda, Morris 11/21/1965, MRN [id number]  PCP:  Billie Ruddy, MD  Cardiologist:  Shelva Majestic, MD (sleep); Dr. Percival Spanish  8 month F/U  Sleep evaluaiton  History of Present Illness:  [name] is a 54 y.o. female who is followed by Dr. Percival Spanish for cardiology care.  She has a history of diastolic heart failure and had been hospitalized in February 2019 with CHF.  She states that she has a longstanding history of significant obesity and had a peak weight in excess of 400 pounds.  Over the last 2 years, she has lost approximately 100 pounds.  Due to concerns for obstructive sleep apnea with symptoms of snoring, nonrestorative sleep, nocturia 3-4 times per night, and daytime sleepiness she ultimately was referred for a sleep study in May 2019.  This revealed very severe sleep apnea with an overall AHI at 97.6/h.  She was unable to achieve any REM sleep.  She had severe oxygen desaturation to a nadir of 64%.  She subsequently underwent CPAP titration due to continued events after CPAP titrated to 18 cm she was transitioned to BiPAP therapy and was titrated to a maximum of 25/15.  Optimal pressure was felt to be 23/15.  She also required supplemental oxygenation and was treated with 3 L/min due to continued low oxygen saturation.  She received a Respironics dream station auto BiPAP unit with set up date November 03, 2017 with [employer] as her DME company.  Apparently, the patient never had a compliance visit with a sleep physician but had seen her cardiologist within the 90-day window.  She was added onto my schedule for a sleep evaluation in January 2021..  [name] has noticed dramatic benefit since initiating CPAP therapy.  Sleep she had severe daytime sleepiness and this has essentially resolved.  A new Epworth Sleepiness Scale score was calculated in the office and this endorsed at 1.  She was unaware of breakthrough snoring.   Her previous 3-4 time nocturia has significantly improved to approximately 1 time per night.  A download obtained in her current mode settings have shown she is not on auto but has been on a fixed EPAP pressure of 8 and IPAP pressure of 16 with a biflex setting of 3.  She apparently has been on a ramp time of 45 minutes with a ramp start pressure of 5.  She is 100% compliant on download from December 12 through February 21, 2019 with 100% use.  Average usage was 6 hours and 50 minutes.  AHI was 5.4 with her 16/8 pressure.  She continues to use 3 L of oxygen and may need new oxygen equipment.  She has been using a full facemask but has noticed some nasal irritation and some leak.  She denies chest pain.  She is unaware of palpitations.  Her shortness of breath has improved.  She is unaware of breakthrough snoring.  She denies restless legs or bruxism.  She denies any hypnagogic hallucinations or cataplectic events.  During her initial sleep evaluation I made changes to her dream station auto BiPAP unit reduced her ramp time from 45 minutes down to 25 minutes and increased her ramp start pressure to 7.  I change her EPAP pressure to 10 with a minimum pressure support of 6 and maximum of 8.  Maximum IPAP pressur was 25.  She was having difficulty with mask and I recommended she change to a  ResMed air fit F 30 i mask.  Since her initial evaluation with a pressure adjustment, mid pelvis felt very well with her BiPAP therapy.  As noted, remotely her previous AHI was 97.6 events per hour and she was unable to achieve any REM sleep on her diagnostic evaluation with significant oxygen desaturation to a nadir of 64%.  A download was obtained in the office over the past 3 months from June 25 through November 03, 2019.  Usage 100% compliant with average use 6 approximately 8 hours per night.  Her 90th percentile IPAP pressure is 17 with a maximum titrated IPAP pressure of 21.  Maximum titrated EPAP pressure is 15.  AHI is  excellent at 2.4.  [name] has the dream station auto BiPAP unit.  Unfortunately, there was a voluntary recall by Resp[employer] system was identified that 0.3% of DreamStation units were identified as having potential degradation of the foam within the machine which was used for sound reduction.  As result a voluntary recall was issued.  [name] had contacted [employer] and have done all the necessary requirements.  Her machine is fairly new.  She does not use any ozone cleaners.  She was concerned about the recall and presented to the office for further evaluation.  Presently she continues to sleep well.  She denies any residual daytime sleepiness, breakthrough snoring, and essentially 0-1 nocturia episodes per night markedly improved from pretreatment.  She is unaware of any nocturnal palpitations.     Past Medical History:  Diagnosis Date  . Chronic diastolic HF (heart failure) ([place])   . Diabetes ([place])   . GERD (gastroesophageal reflux disease)   . Hyperlipemia   . Hypertension   . Knee pain   . Obesity   . Sleep apnea   . Venous insufficiency     Past Surgical History:  Procedure Laterality Date  . Birth [place] removed    . CHOLECYSTECTOMY    . INTRAUTERINE DEVICE INSERTION  01/05/2019   Mirena    Current Medications: Outpatient Medications Prior to Visit  Medication Sig Dispense Refill  . aspirin 81 MG EC tablet TAKE 1 TABLET BY MOUTH EVERY DAY 90 tablet 1  . atorvastatin (LIPITOR) 20 MG tablet TAKE 1 TABLET BY MOUTH EVERY DAY 90 tablet 3  . blood glucose meter kit and supplies Dispense based on patient and insurance preference. Use up to twice daily as directed. (FOR ICD-10 E10.9, E11.9). 1 each 0  . furosemide (LASIX) 40 MG tablet Take 1 tablet (40 mg total) by mouth daily. 90 tablet 2  . glucose blood (ONETOUCH ULTRA) test strip Use as instructed 100 each 1  . Lancets (ONETOUCH DELICA PLUS [id number]) MISC USE TO TEST BLOOD GLUCOSE UP TO 2 TIMES DAILY AS DIRECTED 100  each 0  . levonorgestrel (MIRENA) 20 MCG/24HR IUD by Intrauterine route.    [name] losartan (COZAAR) 100 MG tablet TAKE ONE TABLET BY MOUTH ONCE DAILY 90 tablet 0  . metFORMIN (GLUCOPHAGE) 1000 MG tablet TAKE 1 TABLET (1,000 MG TOTAL) BY MOUTH 2 (TWO) TIMES DAILY WITH A MEAL. 180 tablet 1  . metoprolol succinate (TOPROL-XL) 50 MG 24 hr tablet TAKE ONE TABLET BY MOUTH ONCE DAILY. TAKE WITH OR IMMEDIATELY FOLLOWING A MEAL. 90 tablet 0  . Polyethyl Glycol-Propyl Glycol (SYSTANE) 0.4-0.3 % SOLN Apply to eye.    . Vitamin D, Cholecalciferol, 50 MCG (2000 UT) CAPS Take 1 capsule by mouth daily.    [name] glucose blood (ONE TOUCH ULTRA TEST) test strip  USE TO TEST BLOOD GLUCOSE 2 TIMES DAILY AS DIRECTED 100 each 0  . glucose blood test strip Use to check blood sugar twice daily 100 each 12  . levonorgestrel (MIRENA) 20 MCG/24HR IUD 1 each by Intrauterine route once.     Facility-Administered Medications Prior to Visit  Medication Dose Route Frequency Provider Last Rate Last Admin  . levonorgestrel (MIRENA) 20 MCG/24HR IUD   Intrauterine Once [name], [name], MD         Allergies:   Patient has no known allergies.   Social History   Socioeconomic History  . Marital status: Married    Spouse name: Shundra Wirsing  . Number of children: 2  . Years of education: Not on file  . Highest education level: Not on file  Occupational History  . Occupation: Educator  Tobacco Use  . Smoking status: Never Smoker  . Smokeless tobacco: Never Used  Vaping Use  . Vaping Use: Never used  Substance and Sexual Activity  . Alcohol use: Yes    Alcohol/week: 0.0 standard drinks    Comment: Rare  . Drug use: No  . Sexual activity: Yes    Birth control/protection: I.U.D.    Comment: Mirena 01/05/2019-1st intercourse 54 yo-Fewer than 5 partners  Other Topics Concern  . 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 Difficulty of Paying Living Expenses: Not on file  Food Insecurity:   . Worried About Charity fundraiser in the Last Year: Not on file  . Ran Out of Food in the Last Year: Not on file  Transportation Needs:   . Lack of Transportation (Medical): Not on file  . Lack of Transportation (Non-Medical): Not on file  Physical Activity:   . Days of Exercise per Week: Not on file  . Minutes of Exercise per Session: Not on file  Stress:   . Feeling of Stress : Not on file  Social Connections:   . Frequency of Communication with Friends and Family: Not on file  . Frequency of Social Gatherings with Friends and Family: Not on file  . Attends Religious Services: Not on file  . Active Member of Clubs or Organizations: Not on file  . Attends Archivist Meetings: Not on file  . Marital Status: Not on file    Socially she is married has 2 children ages 31 and 69.  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  She had gone to [employer] for college.  Family History:  The patient's family history includes Anxiety disorder in her mother; Cancer in her maternal uncle, paternal grandfather, and paternal uncle; Diabetes in her father; Hypertension in her father and mother; Obesity in her father.   Her father died at age 79 with sepsis.  Her mother is living at age 82.  ROS General: Negative; No fevers, chills, or night sweats; super morbid obesity HEENT: Negative; No changes in vision or hearing, sinus congestion, difficulty swallowing Pulmonary: Negative; No cough, wheezing, shortness of breath, hemoptysis Cardiovascular: Tree of diastolic heart failure GI: Negative; No nausea, vomiting, diarrhea, or abdominal pain GU: Negative;  No dysuria, hematuria, or difficulty  voiding Musculoskeletal: Negative; no myalgias, joint pain, or weakness Hematologic/Oncology: Negative; no easy bruising, bleeding Endocrine: Negative; no heat/cold intolerance; no diabetes Neuro: Negative; no changes in balance, headaches Skin: Negative; No rashes or skin lesions Psychiatric: Negative; No behavioral problems, depression Sleep: See HPI Other comprehensive 14 point system review is negative.   PHYSICAL EXAM:   VS:  BP 130/78   Pulse 83   Temp 98.1 F (36.7 C)   Ht 5' 5.5" (1.664 m)   Wt (!) 334 lb (151.5 kg)   SpO2 98%   BMI 54.74 kg/m     Repeat blood pressure by me was 116/68.  Wt Readings from Last 3 Encounters:  11/05/19 (!) 334 lb (151.5 kg)  02/22/19 (!) 302 lb 12.8 oz (137.3 kg)  01/01/19 287 lb (130.2 kg)    General: Alert, oriented, no distress.  Super morbid obesity Skin: normal turgor, no rashes, warm and dry HEENT: Normocephalic, atraumatic. Pupils equal round and reactive to light; sclera anicteric; extraocular muscles intact; Nose without nasal septal hypertrophy Mouth/Parynx benign; Mallinpatti scale 3/4 Neck: No JVD, no carotid bruits; normal carotid upstroke Lungs: clear to ausculatation and percussion; no wheezing or rales Chest wall: without tenderness to palpitation Heart: PMI not displaced, RRR, s1 s2 normal, 1/6 systolic murmur, no diastolic murmur, no rubs, gallops, thrills, or heaves Abdomen: Central adiposity; soft, nontender; no hepatosplenomehaly, BS+; abdominal aorta nontender and not dilated by palpation. Back: no CVA tenderness Pulses 2+ Musculoskeletal: full range of motion, normal strength, no joint deformities Extremities: no clubbing cyanosis or edema, Homan's sign negative  Neurologic: grossly nonfocal; Cranial nerves grossly wnl Psychologic: Normal mood and affect   Studies/Labs Reviewed:   No ECG was performed today but I personally reviewed the ECG from February 22, 2019 which showed normal sinus rhythm at 61 bpm  and nonspecific ST changes.  January 04, 2019 EKG (Independently reviewed by me): Sinus bradycardia 57.  No significant ST changes.  Normal intervals.  Recent Labs: BMP Latest Ref Rng & Units 12/16/2018 08/10/2018 02/12/2018  Glucose 65 - 99 mg/dL 92 86 91  BUN 6 - 24 mg/dL '22 23 15  ' Creatinine 0.57 - 1.00 mg/dL 0.63 0.54(L) 0.56(L)  BUN/Creat Ratio 9 - 23 35(H) 43(H) 27(H)  Sodium 134 - 144 mmol/L 142 144 140  Potassium 3.5 - 5.2 mmol/L 4.1 4.0 3.7  Chloride 96 - 106 mmol/L 102 104 99  CO2 20 - 29 mmol/L '24 25 25  ' Calcium 8.7 - 10.2 mg/dL 9.4 9.5 9.6     Hepatic Function Latest Ref Rng & Units 12/16/2018 08/10/2018 02/12/2018  Total Protein 6.0 - 8.5 g/dL 7.1 7.2 7.5  Albumin 3.8 - 4.9 g/dL 4.4 4.6 4.5  AST 0 - 40 IU/L '21 22 30  ' ALT 0 - 32 IU/L 38(H) 37(H) 65(H)  Alk Phosphatase 39 - 117 IU/L 140(H) 148(H) 161(H)  Total Bilirubin 0.0 - 1.2 mg/dL 0.4 0.5 0.7    CBC Latest Ref Rng & Units 10/20/2017 04/05/2017 04/03/2017  WBC 3.4 - 10.8 x10E3/uL 7.9 10.6(H) -  Hemoglobin 11.1 - 15.9 g/dL 15.0 14.1 16.3(H)  Hematocrit 34.0 - 46.6 % 48.0(H) 48.0(H) 48.0(H)  Platelets 150 - 400 K/uL - 265 -   Lab Results  Component Value Date   MCV 90 10/20/2017   MCV 93.0 04/05/2017   MCV 91.0 04/03/2017   Lab Results  Component Value Date   TSH 2.790 10/20/2017   Lab Results  Component Value Date  HGBA1C 5.7 (H) 12/16/2018     BNP    Component Value Date/Time   BNP 90.0 04/03/2017 1331    ProBNP No results found for: PROBNP   Lipid Panel     Component Value Date/Time   CHOL 133 12/16/2018 0817   TRIG 69 12/16/2018 0817   HDL 53 12/16/2018 0817   CHOLHDL 3 09/13/2016 0958   VLDL 21.8 09/13/2016 0958   LDLCALC 66 12/16/2018 0817   LABVLDL 14 12/16/2018 0817     RADIOLOGY: No results found.   Additional studies/ records that were reviewed today include:  I reviewed the records of Dr. Percival Spanish. I have reviewed both her diagnostic polysomnogram as well as CPAP/BiPAP  titration study A download was obtained in the office and reviewed in detail with the patient  A new download was obtained from August 06, 2019 until November 03, 2019 as noted above   ASSESSMENT:    1. Obstructive sleep apnea (adult) (pediatric) on BiPAP   2. Chronic diastolic heart failure ([place])   3. Essential hypertension   4. Morbid obesity ([place])   5. Type 2 diabetes mellitus with complication, without long-term current use of insulin ([place])   6. Hyperlipidemia with target LDL less than 70      PLAN:  [name] is a 54 year old female who has a history of super morbid obesity with peak weight greater than 400 pounds as well as a history of diastolic dysfunction with diastolic heart failure.  She was found to have very severe obstructive sleep apnea on her initial polysomnogram in May 2019 with an AHI of 97.6/h and severe oxygen desaturation to a nadir of 64%.  She required supplemental oxygen during her CPAP/BiPAP titration.  I saw her for initial sleep evaluation in January 2021.  For some reason she had not seen me within her 90-day window of initiation of therapy but had seen Dr. Percival Spanish for compliance.  At her last evaluation, I spent considerable time with her and discussing adverse consequences of sleep apnea on normal sleep architecture as well as the cardiovascular consequences of of untreated sleep apnea regarding hypertension, nocturnal arrhythmias, nocturnal ischemia, effects on inflammation, GERD, and heart failure issues.  Changes were made to her BiPAP auto unit.  She notes significant improvement with the above changes and her most recent download shows excellent compliance with average AHI at 2.4/h.  I had a long discussion with her today regarding the [employer] recall.  A defect occurred in 0.3% of units.  This is a voluntary recall.  [name] unit is fairly new and only 54 years old not greater than 5 years and she has not used ozone cleaner.  She feels significantly  improved since initiating CPAP therapy.  I have recommended that she contact her DME company which is [employer] to see if they can provide her with a bacteria inlet filter which she will be able to place on her BiPAP machine and attached to hose to the filter which is attached to the unit to avoid any rare risk of particulate matter or chemical admission if the foam had degradated within her machine.  She has contacted [employer] and they will be in touch with her.  If she cannot get the bacteria inlet filter from [place], I have advised that she contact [facility] to speak with Anderson Malta who had spoken with today to see if this can be supplied to her.  She will continue current therapy.  Her blood pressure today is  stable on losartan 100 mg and furosemide daily.  She is on atorvastatin 20 mg for hyperlipidemia and LDL cholesterol in November 2020 was 66.  She is diabetic on Metformin.  She will follow up with Dr. Percival Spanish for cardiology care.  As long as she remains stable I will see her in 1 year for reevaluation or sooner as needed.   Medication Adjustments/Labs and Tests Ordered: Current medicines are reviewed at length with the patient today.  Concerns regarding medicines are outlined above.  Medication changes, Labs and Tests ordered today are listed in the Patient Instructions below. Patient Instructions    Follow-Up: At [facility], you and your health needs are our priority.  As part of our continuing mission to provide you with exceptional heart care, we have created designated Provider Care Teams.  These Care Teams include your primary Cardiologist (physician) and Advanced Practice Providers (APPs -  Physician Assistants and Nurse Practitioners) who all work together to provide you with the care you need, when you need it.  We recommend signing up for the patient portal called "MyChart".  Sign up information is provided on this After Visit Summary.  MyChart is used to connect with  patients for Virtual Visits (Telemedicine).  Patients are able to view lab/test results, encounter notes, upcoming appointments, etc.  Non-urgent messages can be sent to your provider as well.   To learn more about what you can do with MyChart, go to [email or web address].    Your next appointment:   12 month(s)  The format for your next appointment:   In Person  Provider:   Shelva Majestic, MD       Signed, Shelva Majestic, MD  11/05/2019 1:06 PM    [place] [address] [place], [place], [place]  [address] Phone: [phone]

## 2019-11-12 ENCOUNTER — Other Ambulatory Visit: Payer: Self-pay

## 2019-11-12 ENCOUNTER — Ambulatory Visit (INDEPENDENT_AMBULATORY_CARE_PROVIDER_SITE_OTHER): Payer: BC Managed Care – PPO | Admitting: Obstetrics and Gynecology

## 2019-11-12 ENCOUNTER — Encounter: Payer: Self-pay | Admitting: Obstetrics and Gynecology

## 2019-11-12 VITALS — BP 124/84 | Ht 65.5 in | Wt 335.0 lb

## 2019-11-12 DIAGNOSIS — Z30431 Encounter for routine checking of intrauterine contraceptive device: Secondary | ICD-10-CM

## 2019-11-12 DIAGNOSIS — Z01419 Encounter for gynecological examination (general) (routine) without abnormal findings: Secondary | ICD-10-CM | POA: Diagnosis not present

## 2019-11-12 DIAGNOSIS — Z23 Encounter for immunization: Secondary | ICD-10-CM

## 2019-11-12 NOTE — Progress Notes (Signed)
[name] [facility] Feb 01, 1966 [id number]  SUBJECTIVE:  54 y.o. G2P2 female for annual routine gynecologic exam and Pap smear. She has no gynecologic concerns.  Current Outpatient Medications  Medication Sig Dispense Refill  . aspirin 81 MG EC tablet TAKE 1 TABLET BY MOUTH EVERY DAY 90 tablet 1  . atorvastatin (LIPITOR) 20 MG tablet TAKE 1 TABLET BY MOUTH EVERY DAY 90 tablet 3  . blood glucose meter kit and supplies Dispense based on patient and insurance preference. Use up to twice daily as directed. (FOR ICD-10 E10.9, E11.9). 1 each 0  . furosemide (LASIX) 40 MG tablet Take 1 tablet (40 mg total) by mouth daily. 90 tablet 2  . glucose blood (ONETOUCH ULTRA) test strip Use as instructed 100 each 1  . Lancets (ONETOUCH DELICA PLUS [id number]) MISC USE TO TEST BLOOD GLUCOSE UP TO 2 TIMES DAILY AS DIRECTED 100 each 0  . levonorgestrel (MIRENA) 20 MCG/24HR IUD by Intrauterine route.    [name] losartan (COZAAR) 100 MG tablet TAKE ONE TABLET BY MOUTH ONCE DAILY 90 tablet 0  . metFORMIN (GLUCOPHAGE) 1000 MG tablet TAKE 1 TABLET (1,000 MG TOTAL) BY MOUTH 2 (TWO) TIMES DAILY WITH A MEAL. 180 tablet 1  . metoprolol succinate (TOPROL-XL) 50 MG 24 hr tablet TAKE ONE TABLET BY MOUTH ONCE DAILY. TAKE WITH OR IMMEDIATELY FOLLOWING A MEAL. 90 tablet 0  . Polyethyl Glycol-Propyl Glycol (SYSTANE) 0.4-0.3 % SOLN Apply to eye.    . Vitamin D, Cholecalciferol, 50 MCG (2000 UT) CAPS Take 1 capsule by mouth daily.     Current Facility-Administered Medications  Medication Dose Route Frequency Provider Last Rate Last Admin  . levonorgestrel (MIRENA) 20 MCG/24HR IUD   Intrauterine Once [name], [name], MD       Allergies: Patient has no known allergies.  No LMP recorded. (Menstrual status: IUD).  Past medical history,surgical history, problem list, medications, allergies, family history and social history were all reviewed and documented as reviewed in the EPIC chart.  ROS:  Feeling well. No dyspnea or chest  pain on exertion.  No abdominal pain, change in bowel habits, black or bloody stools.  No urinary tract symptoms. GYN ROS: no abnormal bleeding, pelvic pain or discharge, no breast pain or new or enlarging lumps on self exam.No neurological complaints.    OBJECTIVE:  BP 124/84 (Cuff Size: Large)   Ht 5' 5.5" (1.664 m)   Wt (!) 335 lb (152 kg)   BMI 54.90 kg/m  The patient appears well, alert, oriented x 3, in no distress. ENT normal.  Abdomen soft without tenderness, guarding, mass or organomegaly.  Neurological is normal, no focal findings.  BREAST EXAM: breasts appear normal, no suspicious masses, no skin or nipple changes or axillary nodes  PELVIC EXAM: VULVA: normal appearing vulva with no masses, tenderness or lesions, VAGINA: normal appearing vagina with normal color and discharge, no lesions, CERVIX: normal appearing cervix without discharge or lesions, IUD string palpated, UTERUS:& ADNEXA: Unable to palpate but no tenderness nor palpable masses  Chaperone: Caryn Bee present during the examination  ASSESSMENT:  54 y.o. G2P2 here for annual gynecologic exam  PLAN:   1. Mirena IUD replaced 12/2018.  Will stay in place until 2025 and we anticipate she will likely be in full menopause by that point.  Amenorrheic with IUD. No significant hot flashes or night sweats. 2. Pap smear/HPV 10/2018.  No significant history of abnormal Pap smears.  Next Pap smear due 2025 following the current guidelines recommending the 5  year interval. 3. Mammogram 01/2019.  Normal breast exam today.  She is reminded to schedule an annual mammogram this year when due. 4.  Colon cancer screening.  Did cologuard in 2019 through primary physician office and she will continue to follow-up there in regard to colon cancer screening recommendations. 5. Health maintenance.  No labs today as she normally has these completed elsewhere.  Return annually or sooner, prn.   Joseph Pierini MD 11/12/19

## 2019-12-02 ENCOUNTER — Other Ambulatory Visit: Payer: Self-pay | Admitting: Family Medicine

## 2019-12-03 ENCOUNTER — Encounter: Payer: BC Managed Care – PPO | Admitting: Family Medicine

## 2019-12-13 ENCOUNTER — Other Ambulatory Visit (HOSPITAL_COMMUNITY): Payer: Self-pay

## 2019-12-15 ENCOUNTER — Other Ambulatory Visit: Payer: Self-pay | Admitting: Family Medicine

## 2019-12-15 NOTE — Telephone Encounter (Signed)
Pt needs appointment for further refills 

## 2019-12-23 ENCOUNTER — Other Ambulatory Visit (HOSPITAL_COMMUNITY): Payer: Self-pay | Admitting: Family Medicine

## 2019-12-23 DIAGNOSIS — Z1231 Encounter for screening mammogram for malignant neoplasm of breast: Secondary | ICD-10-CM

## 2019-12-28 ENCOUNTER — Other Ambulatory Visit: Payer: Self-pay | Admitting: Family Medicine

## 2019-12-28 DIAGNOSIS — I10 Essential (primary) hypertension: Secondary | ICD-10-CM

## 2019-12-28 NOTE — Telephone Encounter (Signed)
Pt needs appointment for further refills 

## 2020-01-05 ENCOUNTER — Other Ambulatory Visit (HOSPITAL_COMMUNITY): Payer: Self-pay | Admitting: Obstetrics and Gynecology

## 2020-01-05 DIAGNOSIS — Z1231 Encounter for screening mammogram for malignant neoplasm of breast: Secondary | ICD-10-CM

## 2020-01-10 ENCOUNTER — Inpatient Hospital Stay (HOSPITAL_COMMUNITY): Admission: RE | Admit: 2020-01-10 | Payer: BC Managed Care – PPO | Source: Ambulatory Visit

## 2020-01-10 DIAGNOSIS — Z1231 Encounter for screening mammogram for malignant neoplasm of breast: Secondary | ICD-10-CM

## 2020-01-19 ENCOUNTER — Inpatient Hospital Stay (HOSPITAL_COMMUNITY): Admission: RE | Admit: 2020-01-19 | Payer: BC Managed Care – PPO | Source: Ambulatory Visit

## 2020-01-19 DIAGNOSIS — Z1231 Encounter for screening mammogram for malignant neoplasm of breast: Secondary | ICD-10-CM

## 2020-01-20 ENCOUNTER — Encounter: Payer: Self-pay | Admitting: Family Medicine

## 2020-01-24 ENCOUNTER — Other Ambulatory Visit (HOSPITAL_COMMUNITY): Payer: Self-pay | Admitting: Obstetrics and Gynecology

## 2020-01-24 DIAGNOSIS — Z1231 Encounter for screening mammogram for malignant neoplasm of breast: Secondary | ICD-10-CM

## 2020-01-25 ENCOUNTER — Telehealth: Payer: BC Managed Care – PPO | Admitting: Family Medicine

## 2020-01-25 DIAGNOSIS — Z7185 Encounter for immunization safety counseling: Secondary | ICD-10-CM

## 2020-01-25 DIAGNOSIS — U071 COVID-19: Secondary | ICD-10-CM | POA: Diagnosis not present

## 2020-01-25 NOTE — Patient Instructions (Signed)
  HOME CARE TIPS:  -can use tylenol or aleve if needed for fevers, aches and pains per instructions  -can use nasal saline a few times per day if nasal congestion, sometime a short course of Afrin nasal spray for 3 days can help as well  -stay hydrated, drink plenty of fluids and eat small healthy meals - avoid dairy  -can take 1000 IU Vit D3 and Vit C lozenges per instructions  -can get your covid booster once feeling all better and you are finished with isolation  -do not get a mammogram for 6-8 weeks following the covid19 booster, you can get your mammogram BEFORE the booster if you like.  -Schedule a regular follow up with your doctor in January  -stay home while sick, except to seek medical care, and if you have COVID19 please stay home for a full 10 days since the onset of symptoms PLUS one day of no fever and feeling better.  It was nice to meet you today, and I really hope you are feeling better soon. I help [place] out with telemedicine visits on Tuesdays and Thursdays and am available for visits on those days. If you have any concerns or questions following this visit please schedule a follow up visit with your Primary Care doctor or seek care at a local urgent care clinic to avoid delays in care.    Seek in person care promptly if your symptoms worsen, new concerns arise or you are not improving with treatment. Call 911 and/or seek emergency care if you symptoms are severe or life threatening.

## 2020-01-25 NOTE — Progress Notes (Signed)
Virtual Visit via Video Note  I connected with [name]  on 01/25/20 at 11:20 AM EST by a video enabled telemedicine application and verified that I am speaking with the correct person using two identifiers.  Location patient: home, [place] Location provider:work or home office Persons participating in the virtual visit: patient, provider  I discussed the limitations of evaluation and management by telemedicine and the availability of in person appointments. The patient expressed understanding and agreed to proceed.   HPI:  Acute telemedicine visit for : -Onset: around Dec 5th or 6th -Symptoms include: low grade temps, cough, congestion - now feeling much better, pulse ox has been great > 95 -going back to work later this week -Denies:CP, SOB, NVD, inability to get out of bed/eat or drink -Has tried: OTC cough medication, did not get MAB -Pertinent past medical history: see chart -Pertinent medication allergies:see chart -COVID-19 vaccine status: fully vaccinated with Levan Hurst -she is wondering when she can get her booster  ROS: See pertinent positives and negatives per HPI.  Past Medical History:  Diagnosis Date  . Chronic diastolic HF (heart failure) ([place])   . Diabetes ([place])   . GERD (gastroesophageal reflux disease)   . Hyperlipemia   . Hypertension   . Knee pain   . Obesity   . Sleep apnea   . Venous insufficiency     Past Surgical History:  Procedure Laterality Date  . Birth [place] removed    . CHOLECYSTECTOMY    . INTRAUTERINE DEVICE INSERTION  01/05/2019   Mirena     Current Outpatient Medications:  .  ONETOUCH ULTRA test strip, USE AS INSTRUCTED, Disp: 100 strip, Rfl: 1 .  aspirin 81 MG EC tablet, TAKE 1 TABLET BY MOUTH EVERY DAY, Disp: 90 tablet, Rfl: 1 .  atorvastatin (LIPITOR) 20 MG tablet, TAKE 1 TABLET BY MOUTH EVERY DAY, Disp: 90 tablet, Rfl: 3 .  blood glucose meter kit and supplies, Dispense based on patient and insurance preference. Use up to twice daily as  directed. (FOR ICD-10 E10.9, E11.9)., Disp: 1 each, Rfl: 0 .  furosemide (LASIX) 40 MG tablet, TAKE 1 TABLET BY MOUTH EVERY DAY, Disp: 90 tablet, Rfl: 0 .  Lancets (ONETOUCH DELICA PLUS [id number]) MISC, USE TO TEST BLOOD GLUCOSE UP TO 2 TIMES DAILY AS DIRECTED, Disp: 100 each, Rfl: 0 .  levonorgestrel (MIRENA) 20 MCG/24HR IUD, by Intrauterine route., Disp: , Rfl:  .  losartan (COZAAR) 100 MG tablet, TAKE ONE TABLET BY MOUTH ONCE DAILY, Disp: 90 tablet, Rfl: 0 .  metFORMIN (GLUCOPHAGE) 1000 MG tablet, TAKE 1 TABLET (1,000 MG TOTAL) BY MOUTH 2 (TWO) TIMES DAILY WITH A MEAL., Disp: 180 tablet, Rfl: 1 .  metoprolol succinate (TOPROL-XL) 50 MG 24 hr tablet, TAKE ONE TABLET BY MOUTH ONCE DAILY WITH OR immediately following A meal, Disp: 90 tablet, Rfl: 0 .  Polyethyl Glycol-Propyl Glycol (SYSTANE) 0.4-0.3 % SOLN, Apply to eye., Disp: , Rfl:  .  Vitamin D, Cholecalciferol, 50 MCG (2000 UT) CAPS, Take 1 capsule by mouth daily., Disp: , Rfl:   Current Facility-Administered Medications:  .  levonorgestrel (MIRENA) 20 MCG/24HR IUD, , Intrauterine, Once, [name], [name], MD  EXAM:  VITALS per patient if applicable:  GENERAL: alert, oriented, appears well and in no acute distress  HEENT: atraumatic, conjunttiva clear, no obvious abnormalities on inspection of external nose and ears  NECK: normal movements of the head and neck  LUNGS: on inspection no signs of respiratory distress, breathing rate appears normal, no obvious  gross SOB, gasping or wheezing  CV: no obvious cyanosis  MS: moves all visible extremities without noticeable abnormality  PSYCH/NEURO: pleasant and cooperative, no obvious depression or anxiety, speech and thought processing grossly intact  ASSESSMENT AND PLAN:  Discussed the following assessment and plan:  COVID-19  Vaccine counseling  -we discussed possible serious and likely etiologies, options for evaluation and workup, limitations of telemedicine visit vs in  person visit, treatment, treatment risks and precautions. Pt prefers to treat via telemedicine empirically rather than in person at this moment. Doing better and did not feel that she needed any medication at this point as the fevers are resolved and cough and sinus congestion are much better and mild. She will be finished with isolation this Friday. She plans to get her mammogram next week and then get her booster once feeling all better and out of isolation. She is due for regular follow up - sent message to schedulers at PCP office to assest as she would like to do this in early January.  Advised to seek prompt in person care if worsening, new symptoms arise, or if is not improving with treatment. Discussed options for inperson care if PCP office not available. Did let this patient know that I only do telemedicine on Tuesdays and Thursdays for [place]. Advised to schedule follow up visit with PCP or [facility] if any further questions or concerns to avoid delays in care.   I discussed the assessment and treatment plan with the patient. The patient was provided an opportunity to ask questions and all were answered. The patient agreed with the plan and demonstrated an understanding of the instructions.     Lucretia Kern, DO

## 2020-01-25 NOTE — Progress Notes (Signed)
Patient scheduled for  physical with Dr. Salomon Fick on 02/28/2020 at 8 AM

## 2020-01-27 ENCOUNTER — Encounter (HOSPITAL_COMMUNITY): Payer: BC Managed Care – PPO

## 2020-01-27 ENCOUNTER — Encounter (HOSPITAL_COMMUNITY): Payer: Self-pay

## 2020-02-02 ENCOUNTER — Ambulatory Visit (HOSPITAL_COMMUNITY)
Admission: RE | Admit: 2020-02-02 | Discharge: 2020-02-02 | Disposition: A | Discharge status: Home / Self Care | Payer: BC Managed Care – PPO | Source: Ambulatory Visit | Attending: Obstetrics and Gynecology | Admitting: Obstetrics and Gynecology

## 2020-02-02 ENCOUNTER — Other Ambulatory Visit: Payer: Self-pay

## 2020-02-02 DIAGNOSIS — Z1231 Encounter for screening mammogram for malignant neoplasm of breast: Secondary | ICD-10-CM | POA: Insufficient documentation

## 2020-02-25 ENCOUNTER — Other Ambulatory Visit: Payer: Self-pay | Admitting: Family Medicine

## 2020-02-25 DIAGNOSIS — E119 Type 2 diabetes mellitus without complications: Secondary | ICD-10-CM

## 2020-02-28 ENCOUNTER — Encounter: Payer: BC Managed Care – PPO | Admitting: Family Medicine

## 2020-03-07 ENCOUNTER — Other Ambulatory Visit: Payer: Self-pay | Admitting: Family Medicine

## 2020-03-22 ENCOUNTER — Other Ambulatory Visit: Payer: Self-pay | Admitting: Family Medicine

## 2020-03-22 DIAGNOSIS — I10 Essential (primary) hypertension: Secondary | ICD-10-CM

## 2020-04-07 ENCOUNTER — Ambulatory Visit (INDEPENDENT_AMBULATORY_CARE_PROVIDER_SITE_OTHER): Payer: BC Managed Care – PPO | Admitting: Family Medicine

## 2020-04-07 ENCOUNTER — Encounter: Payer: Self-pay | Admitting: Family Medicine

## 2020-04-07 ENCOUNTER — Other Ambulatory Visit: Payer: Self-pay

## 2020-04-07 VITALS — BP 118/80 | HR 72 | Temp 97.7°F | Ht 65.5 in | Wt 348.0 lb

## 2020-04-07 DIAGNOSIS — I10 Essential (primary) hypertension: Secondary | ICD-10-CM

## 2020-04-07 DIAGNOSIS — Z0001 Encounter for general adult medical examination with abnormal findings: Secondary | ICD-10-CM | POA: Diagnosis not present

## 2020-04-07 DIAGNOSIS — Z23 Encounter for immunization: Secondary | ICD-10-CM | POA: Diagnosis not present

## 2020-04-07 DIAGNOSIS — H9319 Tinnitus, unspecified ear: Secondary | ICD-10-CM | POA: Diagnosis not present

## 2020-04-07 DIAGNOSIS — E782 Mixed hyperlipidemia: Secondary | ICD-10-CM

## 2020-04-07 DIAGNOSIS — M722 Plantar fascial fibromatosis: Secondary | ICD-10-CM | POA: Diagnosis not present

## 2020-04-07 DIAGNOSIS — R200 Anesthesia of skin: Secondary | ICD-10-CM | POA: Diagnosis not present

## 2020-04-07 DIAGNOSIS — E1169 Type 2 diabetes mellitus with other specified complication: Secondary | ICD-10-CM

## 2020-04-07 DIAGNOSIS — Z6841 Body Mass Index (BMI) 40.0 and over, adult: Secondary | ICD-10-CM

## 2020-04-07 LAB — LIPID PANEL
Cholesterol: 138 mg/dL (ref 0–200)
HDL: 56.9 mg/dL (ref >39.00)
LDL Cholesterol: 64 mg/dL (ref 0–99)
NonHDL: 80.73
Total CHOL/HDL Ratio: 2
Triglycerides: 82 mg/dL (ref 0.0–149.0)
VLDL: 16.4 mg/dL (ref 0.0–40.0)

## 2020-04-07 LAB — BASIC METABOLIC PANEL
BUN: 17 mg/dL (ref 6–23)
CO2: 31 mEq/L (ref 19–32)
Calcium: 9.4 mg/dL (ref 8.4–10.5)
Chloride: 101 mEq/L (ref 96–112)
Creatinine, Ser: 0.62 mg/dL (ref 0.40–1.20)
GFR: 100.8 mL/min (ref >60.00)
Glucose, Bld: 110 mg/dL — ABNORMAL HIGH (ref 70–99)
Potassium: 4.1 mEq/L (ref 3.5–5.1)
Sodium: 141 mEq/L (ref 135–145)

## 2020-04-07 LAB — VITAMIN D 25 HYDROXY (VIT D DEFICIENCY, FRACTURES): VITD: 62.07 ng/mL (ref 30.00–100.00)

## 2020-04-07 LAB — HEMOGLOBIN A1C: Hgb A1c MFr Bld: 6.1 % (ref 4.6–6.5)

## 2020-04-07 LAB — CBC WITH DIFFERENTIAL/PLATELET
Basophils Absolute: 0.1 10*3/uL (ref 0.0–0.1)
Basophils Relative: 0.9 % (ref 0.0–3.0)
Eosinophils Absolute: 0.1 10*3/uL (ref 0.0–0.7)
Eosinophils Relative: 1.4 % (ref 0.0–5.0)
HCT: 38.8 % (ref 36.0–46.0)
Hemoglobin: 13 g/dL (ref 12.0–15.0)
Lymphocytes Relative: 23.9 % (ref 12.0–46.0)
Lymphs Abs: 1.4 10*3/uL (ref 0.7–4.0)
MCHC: 33.6 g/dL (ref 30.0–36.0)
MCV: 89.6 fl (ref 78.0–100.0)
Monocytes Absolute: 0.4 10*3/uL (ref 0.1–1.0)
Monocytes Relative: 6.4 % (ref 3.0–12.0)
Neutro Abs: 3.9 10*3/uL (ref 1.4–7.7)
Neutrophils Relative %: 67.4 % (ref 43.0–77.0)
Platelets: 233 10*3/uL (ref 150.0–400.0)
RBC: 4.33 Mil/uL (ref 3.87–5.11)
RDW: 14 % (ref 11.5–15.5)
WBC: 5.8 10*3/uL (ref 4.0–10.5)

## 2020-04-07 LAB — FOLATE: Folate: 9.3 ng/mL (ref >5.9)

## 2020-04-07 LAB — TSH: TSH: 2.45 u[IU]/mL (ref 0.35–4.50)

## 2020-04-07 LAB — T4, FREE: Free T4: 0.85 ng/dL (ref 0.60–1.60)

## 2020-04-07 LAB — VITAMIN B12: Vitamin B-12: 203 pg/mL — ABNORMAL LOW (ref 211–911)

## 2020-04-07 NOTE — Patient Instructions (Addendum)
Preventive Care 84-55 Years Old, Female Preventive care refers to lifestyle choices and visits with your health care provider that can promote health and wellness. This includes:  A yearly physical exam. This is also called an annual wellness visit.  Regular dental and eye exams.  Immunizations.  Screening for certain conditions.  Healthy lifestyle choices, such as: ? Eating a healthy diet. ? Getting regular exercise. ? Not using drugs or products that contain nicotine and tobacco. ? Limiting alcohol use. What can I expect for my preventive care visit? Physical exam Your health care provider will check your:  Height and weight. These may be used to calculate your BMI (body mass index). BMI is a measurement that tells if you are at a healthy weight.  Heart rate and blood pressure.  Body temperature.  Skin for abnormal spots. Counseling Your health care provider may ask you questions about your:  Past medical problems.  Family's medical history.  Alcohol, tobacco, and drug use.  Emotional well-being.  Home life and relationship well-being.  Sexual activity.  Diet, exercise, and sleep habits.  Work and work Statistician.  Access to firearms.  Method of birth control.  Menstrual cycle.  Pregnancy history. What immunizations do I need? Vaccines are usually given at various ages, according to a schedule. Your health care provider will recommend vaccines for you based on your age, medical history, and lifestyle or other factors, such as travel or where you work.   What tests do I need? Blood tests  Lipid and cholesterol levels. These may be checked every 5 years, or more often if you are over 55 years old.  Hepatitis C test.  Hepatitis B test. Screening  Lung cancer screening. You may have this screening every year starting at age 55 if you have a 30-pack-year history of smoking and currently smoke or have quit within the past 15 years.  Colorectal cancer  screening. ? All adults should have this screening starting at age 55 and continuing until age 55. ? Your health care provider may recommend screening at age 49 if you are at increased risk. ? You will have tests every 1-10 years, depending on your results and the type of screening test.  Diabetes screening. ? This is done by checking your blood sugar (glucose) after you have not eaten for a while (fasting). ? You may have this done every 1-3 years.  Mammogram. ? This may be done every 1-2 years. ? Talk with your health care provider about when you should start having regular mammograms. This may depend on whether you have a family history of breast cancer.  BRCA-related cancer screening. This may be done if you have a family history of breast, ovarian, tubal, or peritoneal cancers.  Pelvic exam and Pap test. ? This may be done every 3 years starting at age 55. ? Starting at age 55, this may be done every 55 years if you have a Pap test in combination with an HPV test. Other tests  STD (sexually transmitted disease) testing, if you are at risk.  Bone density scan. This is done to screen for osteoporosis. You may have this scan if you are at high risk for osteoporosis. Talk with your health care provider about your test results, treatment options, and if necessary, the need for more tests. Follow these instructions at home: Eating and drinking  Eat a diet that includes fresh fruits and vegetables, whole grains, lean protein, and low-fat dairy products.  Take vitamin and mineral supplements  as recommended by your health care provider.  Do not drink alcohol if: ? Your health care provider tells you not to drink. ? You are pregnant, may be pregnant, or are planning to become pregnant.  If you drink alcohol: ? Limit how much you have to 0-1 drink a day. ? Be aware of how much alcohol is in your drink. In the [place] one drink equals one 12 oz bottle of beer (355 mL), one 5 oz glass of  wine (148 mL), or one 1 oz glass of hard liquor (44 mL).   Lifestyle  Take daily care of your teeth and gums. Brush your teeth every morning and night with fluoride toothpaste. Floss one time each day.  Stay active. Exercise for at least 30 minutes 5 or more days each week.  Do not use any products that contain nicotine or tobacco, such as cigarettes, e-cigarettes, and chewing tobacco. If you need help quitting, ask your health care provider.  Do not use drugs.  If you are sexually active, practice safe sex. Use a condom or other form of protection to prevent STIs (sexually transmitted infections).  If you do not wish to become pregnant, use a form of birth control. If you plan to become pregnant, see your health care provider for a prepregnancy visit.  If told by your health care provider, take low-dose aspirin daily starting at age 55.  Find healthy ways to cope with stress, such as: ? Meditation, yoga, or listening to music. ? Journaling. ? Talking to a trusted person. ? Spending time with friends and family. Safety  Always wear your seat belt while driving or riding in a vehicle.  Do not drive: ? If you have been drinking alcohol. Do not ride with someone who has been drinking. ? When you are tired or distracted. ? While texting.  Wear a helmet and other protective equipment during sports activities.  If you have firearms in your house, make sure you follow all gun safety procedures. What's next?  Visit your health care provider once a year for an annual wellness visit.  Ask your health care provider how often you should have your eyes and teeth checked.  Stay up to date on all vaccines. This information is not intended to replace advice given to you by your health care provider. Make sure you discuss any questions you have with your health care provider. Document Revised: 11/02/2019 Document Reviewed: 10/09/2017 Elsevier Patient Education  2021 [place].  Tinnitus Tinnitus refers to hearing a sound when there is no actual source for that sound. This is often described as ringing in the ears. However, people with this condition may hear a variety of noises, in one ear or in both ears. The sounds of tinnitus can be soft, loud, or somewhere in between. Tinnitus can last for a few seconds or can be constant for days. It may go away without treatment and come back at various times. When tinnitus is constant or happens often, it can lead to other problems, such as trouble sleeping and trouble concentrating. Almost everyone experiences tinnitus at some point. Tinnitus that is long-lasting (chronic) or comes back often (recurs) may require medical attention. What are the causes? The cause of tinnitus is often not known. In some cases, it can result from:  Exposure to loud noises from machinery, music, or other sources.  An object (foreign body) stuck in the ear.  Earwax buildup.  Drinking alcohol or caffeine.  Taking certain medicines.  Age-related hearing loss. It may also be caused by medical conditions such as:  Ear or sinus infections.  High blood pressure.  Heart diseases.  Anemia.  Allergies.  Meniere's disease.  Thyroid problems.  Tumors.  A weak, bulging blood vessel (aneurysm) near the ear. What are the signs or symptoms? The main symptom of tinnitus is hearing a sound when there is no source for that sound. It may sound like:  Buzzing.  Roaring.  Ringing.  Blowing air.  Hissing.  Whistling.  Sizzling.  Humming.  Running water.  A musical note.  Tapping. Symptoms may affect only one ear (unilateral) or both ears (bilateral). How is this diagnosed? Tinnitus is diagnosed based on your symptoms, your medical history, and a physical exam. Your health care provider may do a thorough hearing test (audiologic exam) if your tinnitus:  Is unilateral.  Causes hearing difficulties.  Lasts 6 months or  longer. You may work with a health care provider who specializes in hearing disorders (audiologist). You may be asked questions about your symptoms and how they affect your daily life. You may have other tests done, such as:  CT scan.  MRI.  An imaging test of how blood flows through your blood vessels (angiogram). How is this treated? Treating an underlying medical condition can sometimes make tinnitus go away. If your tinnitus continues, other treatments may include:  Medicines.  Therapy and counseling to help you manage the stress of living with tinnitus.  Sound generators to mask the tinnitus. These include: ? Tabletop sound machines that play relaxing sounds to help you fall asleep. ? Wearable devices that fit in your ear and play sounds or music. ? Acoustic neural stimulation. This involves using headphones to listen to music that contains an auditory signal. Over time, listening to this signal may change some pathways in your brain and make you less sensitive to tinnitus. This treatment is used for very severe cases when no other treatment is working.  Using hearing aids or cochlear implants if your tinnitus is related to hearing loss. Hearing aids are worn in the outer ear. Cochlear implants are surgically placed in the inner ear. Follow these instructions at home: Managing symptoms  When possible, avoid being in loud places and being exposed to loud sounds.  Wear hearing protection, such as earplugs, when you are exposed to loud noises.  Use a white noise machine, a humidifier, or other devices to mask the sound of tinnitus.  Practice techniques for reducing stress, such as meditation, yoga, or deep breathing. Work with your health care provider if you need help with managing stress.  Sleep with your head slightly raised. This may reduce the impact of tinnitus.      General instructions  Do not use stimulants, such as nicotine, alcohol, or caffeine. Talk with your health  care provider about other stimulants to avoid. Stimulants are substances that can make you feel alert and attentive by increasing certain activities in the body (such as heart rate and blood pressure). These substances may make tinnitus worse.  Take over-the-counter and prescription medicines only as told by your health care provider.  Try to get plenty of sleep each night.  Keep all follow-up visits as told by your health care provider. This is important. Contact a health care provider if:  Your tinnitus continues for 3 weeks or longer without stopping.  You develop sudden hearing loss.  Your symptoms get worse or do not get better with home care.  You feel  you are not able to manage the stress of living with tinnitus. Get help right away if:  You develop tinnitus after a head injury.  You have tinnitus along with any of the following: ? Dizziness. ? Loss of balance. ? Nausea and vomiting. ? Sudden, severe headache. These symptoms may represent a serious problem that is an emergency. Do not wait to see if the symptoms will go away. Get medical help right away. Call your local emergency services (911 in the [place] Do not drive yourself to the hospital. Summary  Tinnitus refers to hearing a sound when there is no actual source for that sound. This is often described as ringing in the ears.  Symptoms may affect only one ear (unilateral) or both ears (bilateral).  Use a white noise machine, a humidifier, or other devices to mask the sound of tinnitus.  Do not use stimulants, such as nicotine, alcohol, or caffeine. Talk with your health care provider about other stimulants to avoid. These substances may make tinnitus worse. This information is not intended to replace advice given to you by your health care provider. Make sure you discuss any questions you have with your health care provider. Document Revised: 08/11/2018 Document Reviewed: 11/07/2016 Elsevier Patient Education  2021  [place].  Plantar Fasciitis  Plantar fasciitis is a painful foot condition that affects the heel. It occurs when the band of tissue that connects the toes to the heel bone (plantar fascia) becomes irritated. This can happen as the result of exercising too much or doing other repetitive activities (overuse injury). Plantar fasciitis can cause mild irritation to severe pain that makes it difficult to walk or move. The pain is usually worse in the morning after sleeping, or after sitting or lying down for a period of time. Pain may also be worse after long periods of walking or standing. What are the causes? This condition may be caused by:  Standing for long periods of time.  Wearing shoes that do not have good arch support.  Doing activities that put stress on joints (high-impact activities). This includes ballet and exercise that makes your heart beat faster (aerobic exercise), such as running.  Being overweight.  An abnormal way of walking (gait).  Tight muscles in the back of your lower leg (calf).  High arches in your feet or flat feet.  Starting a new athletic activity. What are the signs or symptoms? The main symptom of this condition is heel pain. Pain may get worse after the following:  Taking the first steps after a time of rest, especially in the morning after awakening, or after you have been sitting or lying down for a while.  Long periods of standing still. Pain may decrease after 30-45 minutes of activity, such as gentle walking. How is this diagnosed? This condition may be diagnosed based on your medical history, a physical exam, and your symptoms. Your health care provider will check for:  A tender area on the bottom of your foot.  A high arch in your foot or flat feet.  Pain when you move your foot.  Difficulty moving your foot. You may have imaging tests to confirm the diagnosis, such as:  X-rays.  Ultrasound.  MRI. How is this treated? Treatment  for plantar fasciitis depends on how severe your condition is. Treatment may include:  Rest, ice, pressure (compression), and raising (elevating) the affected foot. This is called RICE therapy. Your health care provider may recommend RICE therapy along with over-the-counter pain medicines  to manage your pain.  Exercises to stretch your calves and your plantar fascia.  A splint that holds your foot in a stretched, upward position while you sleep (night splint).  Physical therapy to relieve symptoms and prevent problems in the future.  Injections of steroid medicine (cortisone) to relieve pain and inflammation.  Stimulating your plantar fascia with electrical impulses (extracorporeal shock wave therapy). This is usually the last treatment option before surgery.  Surgery, if other treatments have not worked after 12 months. Follow these instructions at home: Managing pain, stiffness, and swelling  If directed, put ice on the painful area. To do this: ? Put ice in a plastic bag, or use a frozen bottle of water. ? Place a towel between your skin and the bag or bottle. ? Roll the bottom of your foot over the bag or bottle. ? Do this for 20 minutes, 2-3 times a day.  Wear athletic shoes that have air-sole or gel-sole cushions, or try soft shoe inserts that are designed for plantar fasciitis.  Elevate your foot above the level of your heart while you are sitting or lying down.   Activity  Avoid activities that cause pain. Ask your health care provider what activities are safe for you.  Do physical therapy exercises and stretches as told by your health care provider.  Try activities and forms of exercise that are easier on your joints (low impact). Examples include swimming, water aerobics, and biking. General instructions  Take over-the-counter and prescription medicines only as told by your health care provider.  Wear a night splint while sleeping, if told by your health care provider.  Loosen the splint if your toes tingle, become numb, or turn cold and blue.  Maintain a healthy weight, or work with your health care provider to lose weight as needed.  Keep all follow-up visits. This is important. Contact a health care provider if you have:  Symptoms that do not go away with home treatment.  Pain that gets worse.  Pain that affects your ability to move or do daily activities. Summary  Plantar fasciitis is a painful foot condition that affects the heel. It occurs when the band of tissue that connects the toes to the heel bone (plantar fascia) becomes irritated.  Heel pain is the main symptom of this condition. It may get worse after exercising too much or standing still for a long time.  Treatment varies, but it usually starts with rest, ice, pressure (compression), and raising (elevating) the affected foot. This is called RICE therapy. Over-the-counter medicines can also be used to manage pain. This information is not intended to replace advice given to you by your health care provider. Make sure you discuss any questions you have with your health care provider. Document Revised: 05/17/2019 Document Reviewed: 05/17/2019 Elsevier Patient Education  2021 [place].  Plantar Fasciitis Rehab Ask your health care provider which exercises are safe for you. Do exercises exactly as told by your health care provider and adjust them as directed. It is normal to feel mild stretching, pulling, tightness, or discomfort as you do these exercises. Stop right away if you feel sudden pain or your pain gets worse. Do not begin these exercises until told by your health care provider. Stretching and range-of-motion exercises These exercises warm up your muscles and joints and improve the movement and flexibility of your foot. These exercises also help to relieve pain. Plantar fascia stretch 1. Sit with your left / right leg crossed over your opposite  knee. 2. Hold your heel with one  hand with that thumb near your arch. With your other hand, hold your toes and gently pull them back toward the top of your foot. You should feel a stretch on the base (bottom) of your toes, or the bottom of your foot (plantar fascia), or both. 3. Hold this stretch for__________ seconds. 4. Slowly release your toes and return to the starting position. Repeat __________ times. Complete this exercise __________ times a day.   Gastrocnemius stretch, standing This exercise is also called a calf (gastroc) stretch. It stretches the muscles in the back of the upper calf. 1. Stand with your hands against a wall. 2. Extend your left / right leg behind you, and bend your front knee slightly. 3. Keeping your heels on the floor, your toes facing forward, and your back knee straight, shift your weight toward the wall. Do not arch your back. You should feel a gentle stretch in your upper calf. 4. Hold this position for __________ seconds. Repeat __________ times. Complete this exercise __________ times a day.   Soleus stretch, standing This exercise is also called a calf (soleus) stretch. It stretches the muscles in the back of the lower calf. 1. Stand with your hands against a wall. 2. Extend your left / right leg behind you, and bend your front knee slightly. 3. Keeping your heels on the floor and your toes facing forward, bend your back knee and shift your weight slightly over your back leg. You should feel a gentle stretch deep in your lower calf. 4. Hold this position for __________ seconds. Repeat __________ times. Complete this exercise __________ times a day. Gastroc and soleus stretch, standing step This exercise stretches the muscles in the back of the lower leg. These muscles are in the upper calf (gastrocnemius) and the lower calf (soleus). 1. Stand with the ball of your left / right foot on the front of a step. The ball of your foot is on the walking surface, right under your toes. 2. Keep your  other foot firmly on the same step. 3. Hold on to the wall or a railing for balance. 4. Slowly lift your other foot, allowing your body weight to press your heel down over the edge of the front of the step. Keep knee straight and unbent. You should feel a stretch in your calf. 5. Hold this position for __________ seconds. 6. Return both feet to the step. 7. Repeat this exercise with a slight bend in your left / right knee. Repeat __________ times with your left / right knee straight and __________ times with your left / right knee bent. Complete this exercise __________ times a day. Balance exercise This exercise builds your balance and strength control of your arch to help take pressure off your plantar fascia. Single leg stand If this exercise is too easy, you can try it with your eyes closed or while standing on a pillow. 1. Without shoes, stand near a railing or in a doorway. You may hold on to the railing or door frame as needed. 2. Stand on your left / right foot. Keep your big toe down on the floor and lift the arch of your foot. You should feel a stretch across the bottom of your foot and your arch. Do not let your foot roll inward. 3. Hold this position for __________ seconds. Repeat __________ times. Complete this exercise __________ times a day. This information is not intended to replace advice given to you  by your health care provider. Make sure you discuss any questions you have with your health care provider. Document Revised: 11/11/2019 Document Reviewed: 11/11/2019 Elsevier Patient Education  2021 [employer].  Immunization Schedule, 40-21 Years Old  Vaccines are usually given at various ages, according to a schedule. Your health care provider will recommend vaccines for you based on your age, medical history, and lifestyle or other factors such as travel or where you work. You may receive vaccines as individual doses or as more than one vaccine together in one shot  (combination vaccines). Talk with your health care provider about the risks and benefits of combination vaccines. Recommended immunizations for 1-62 years old Influenza vaccine  You should get a dose of the influenza vaccine every year. Tetanus, diphtheria, and pertussis vaccine A vaccine that protects against tetanus, diphtheria, and pertussis is known as the Tdap vaccine. A vaccine that protects against tetanus and diphtheria is known as the Td vaccine.  You should only get the Td vaccine if you have had at least 1 dose of the Tdap vaccine.  You should get 1 dose of the Td or Tdap vaccine every 10 years, or you should get 1 dose of the Tdap vaccine if: ? You have not previously gotten a Tdap vaccine. ? You do not know if you have ever gotten a Tdap vaccine. Zoster vaccine This is also known as the RZV vaccine. You should get 2 doses of the RZV vaccine 2 to 6 months apart. It is important to get the RZV vaccine even if you:  Have had shingles.  Have received the ZVL vaccine, an older version of the RZV vaccine.  Are unsure if you have had chickenpox (varicella). Pneumococcal conjugate vaccine This is also known as the PCV13 vaccine. You should get the PCV13 vaccine as recommended if you have certain high-risk conditions. These include:  Diabetes.  Chronic conditions of the heart, lungs, or liver.  Conditions that affect the body's disease-fighting system (immune system). Pneumococcal polysaccharide vaccine This is also known as the PPSV23 vaccine. You should get the PPSV23 vaccine as recommended if you have certain high-risk conditions. These include:  Diabetes.  Chronic conditions of the heart, lungs, or liver.  Conditions that affect the immune system. Hepatitis A vaccine This is also known as the HepA vaccine. If you did not get the HepA vaccine previously, you should get it if:  You are at risk for a hepatitis A infection. You may be at risk for infection if you: ? Have  chronic liver disease. ? Have HIV or AIDS. ? Are a man who has sex with men. ? Use drugs. ? Are homeless. ? May be exposed to hepatitis A through work. ? Travel to countries where hepatitis A is common. ? Have or will have close contact with someone who was adopted from another country.  You are not at risk for infection but want protection from hepatitis A. Hepatitis B vaccine This is also known as the HepB vaccine. If you did not get the HepB vaccine previously, you should get it if:  You are at risk for hepatitis B infection. You are at risk if you: ? Have chronic liver disease. ? Have HIV or AIDS. ? Have sex with a partner who has hepatitis B, or:  You have multiple sex partners.  You are a man who has sex with men. ? Use drugs. ? May be exposed to hepatitis B through work. ? Live with someone who has hepatitis B. ?  Receive dialysis treatment. ? Have diabetes. ? Travel to countries where hepatitis B is common.  You are not at risk of infection but want protection from hepatitis B. Measles, mumps, and rubella vaccine This is also known as the MMR vaccine. You may need to get the MMR vaccine if you were born in 1957 or later and:  You need to catch up on doses you missed in the past.  You have not been given the vaccine before.  You do not have evidence of immunity (by a blood test). Varicella vaccine This is also known as the VAR vaccine. You may need to get the VAR vaccine if you do not have evidence of immunity (by a blood test) and you may be exposed to varicella through work. This is especially important if you work in a health care setting. Meningococcal conjugate vaccine This is also known as the MenACWY vaccine. You may need to get the MenACWY vaccine if you:  Have not been given the vaccine before.  Need to catch up on doses you missed in the past. This vaccine is especially important if you:  Do not have a spleen.  Have sickle cell disease.  Have  HIV.  Take medicines that suppress your immune system.  Travel to countries where meningococcal disease is common.  Are exposed to Neisseria meningitidis at work. Serogroup B meningococcal vaccine This is also known as the MenB vaccine. You may need to get the MenB vaccine if you:  Have not been given the vaccine before.  Need to catch up on doses you missed in the past. This vaccine is especially important if you:  Do not have a spleen.  Have sickle cell disease.  Take medicines that suppress your immune system.  Are exposed to Neisseria meningitidis at work. Haemophilus influenzae type b vaccine This is also known as the Hib vaccine. Anyone older than 55 years of age is usually not given the Hib vaccine. However, if you have certain high-risk conditions, you may need to get this vaccine. These conditions include:  Not having a spleen.  Having received a stem cell transplant. Before you get a vaccine: Talk with your health care provider about which vaccines are right for you. This is especially important if:  You previously had a reaction after getting a vaccine.  You have a weakened immune system. You may have a weakened immune system if you: ? Are taking medicines that reduce (suppress) the activity of your immune system. ? Are taking medicines to treat cancer (chemotherapy). ? Have HIV or AIDS.  You work in an environment where you may be exposed to a disease.  You plan to travel outside of the country.  You have a chronic illness, such as heart disease, kidney disease, diabetes, or lung disease. Summary  Before you get a vaccine, tell your health care provider if you have reacted to vaccines in the past or have a condition that weakens your immune system.  At 50-64 years, you should get a dose of the flu vaccine every year and a dose of the Td or Tdap vaccine every 10 years.  You should get 2 doses of the RZV vaccine 2 to 6 months apart.  Depending on your  medical history and your risk factors, you may need other vaccines. Ask your health care provider whether you are up to date on all your vaccines. This information is not intended to replace advice given to you by your health care provider. Make sure you  discuss any questions you have with your health care provider. Document Revised: 11/24/2018 Document Reviewed: 11/24/2018 Elsevier Patient Education  2021 Elsevier Inc.  Managing Stress, Adult Feeling a certain amount of stress is normal. Stress helps our body and mind get ready to deal with the demands of life. Stress hormones can motivate you to do well at work and meet your responsibilities. However severe or long-lasting (chronic) stress can affect your mental and physical health. Chronic stress puts you at higher risk for anxiety, depression, and other health problems like digestive problems, muscle aches, heart disease, high blood pressure, and stroke. What are the causes? Common causes of stress include:  Demands from work, such as deadlines, feeling overworked, or having long hours.  Pressures at home, such as money issues, disagreements with a spouse, or parenting issues.  Pressures from major life changes, such as divorce, moving, loss of a loved one, or chronic illness. You may be at higher risk for stress-related problems if you do not get enough sleep, are in poor health, do not have emotional support, or have a mental health disorder like anxiety or depression. How to recognize stress Stress can make you:  Have trouble sleeping.  Feel sad, anxious, irritable, or overwhelmed.  Lose your appetite.  Overeat or want to eat unhealthy foods.  Want to use drugs or alcohol. Stress can also cause physical symptoms, such as:  Sore, tense muscles, especially in the shoulders and neck.  Headaches.  Trouble breathing.  A faster heart rate.  Stomach pain, nausea, or vomiting.  Diarrhea or constipation.  Trouble  concentrating. Follow these instructions at home: Lifestyle  Identify the source of your stress and your reaction to it. See a therapist who can help you change your reactions.  When there are stressful events: ? Talk about it with family, friends, or co-workers. ? Try to think realistically about stressful events and not ignore them or overreact. ? Try to find the positives in a stressful situation and not focus on the negatives. ? Cut back on responsibilities at work and home, if possible. Ask for help from friends or family members if you need it.  Find ways to cope with stress, such as: ? Meditation. ? Deep breathing. ? Yoga or tai chi. ? Progressive muscle relaxation. ? Doing art, playing music, or reading. ? Making time for fun activities. ? Spending time with family and friends.  Get support from family, friends, or spiritual resources. Eating and drinking  Eat a healthy diet. This includes: ? Eating foods that are high in fiber, such as beans, whole grains, and fresh fruits and vegetables. ? Limiting foods that are high in fat and processed sugars, such as fried and sweet foods.  Do not skip meals or overeat.  Drink enough fluid to keep your urine pale yellow. Alcohol use  Do not drink alcohol if: ? Your health care provider tells you not to drink. ? You are pregnant, may be pregnant, or are planning to become pregnant.  Drinking alcohol is a way some people try to ease their stress. This can be dangerous, so if you drink alcohol: ? Limit how much you use to:  0-1 drink a day for women.  0-2 drinks a day for men. ? Be aware of how much alcohol is in your drink. In the [place] one drink equals one 12 oz bottle of beer (355 mL), one 5 oz glass of wine (148 mL), or one 1 oz glass of hard liquor (44 mL).  Activity  Include 30 minutes of exercise in your daily schedule. Exercise is a good stress reducer.  Include time in your day for an activity that you find  relaxing. Try taking a walk, going on a bike ride, reading a book, or listening to music.  Schedule your time in a way that lowers stress, and keep a consistent schedule. Prioritize what is most important to get done.   General instructions  Get enough sleep. Try to go to sleep and get up at about the same time every day.  Take over-the-counter and prescription medicines only as told by your health care provider.  Do not use any products that contain nicotine or tobacco, such as cigarettes, e-cigarettes, and chewing tobacco. If you need help quitting, ask your health care provider.  Do not use drugs or smoke to cope with stress.  Keep all follow-up visits as told by your health care provider. This is important. Where to find support  Talk with your health care provider about stress management or finding a support group.  Find a therapist to work with you on your stress management techniques. Contact a health care provider if:  Your stress symptoms get worse.  You are unable to manage your stress at home.  You are struggling to stop using drugs or alcohol. Get help right away if:  You may be a danger to yourself or others.  You have any thoughts of death or suicide. If you ever feel like you may hurt yourself or others, or have thoughts about taking your own life, get help right away. You can go to your nearest emergency department or call:  Your local emergency services (911 in the [place]  A suicide crisis helpline, such as the [facility] at (431) 045-3111. This is open 24 hours a day. Summary  Feeling a certain amount of stress is normal, but severe or long-lasting (chronic) stress can affect your mental and physical health.  Chronic stress can put you at higher risk for anxiety, depression, and other health problems like digestive problems, muscle aches, heart disease, high blood pressure, and stroke.  You may be at higher risk for stress-related  problems if you do not get enough sleep, are in poor health, lack emotional support, or have a mental health disorder like anxiety or depression.  Identify the source of your stress and your reaction to it. Try talking about stressful events with family, friends, or co-workers, finding a coping method, or getting support from spiritual resources.  If you need more help, talk with your health care provider about finding a support group or a mental health therapist. This information is not intended to replace advice given to you by your health care provider. Make sure you discuss any questions you have with your health care provider. Document Revised: 08/26/2018 Document Reviewed: 08/26/2018 Elsevier Patient Education  [place].

## 2020-04-07 NOTE — Progress Notes (Signed)
Subjective:     [name] is a 55 y.o. female and is here for a comprehensive physical exam. Pt states she has been doing ok overall.  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  Pt planning to start exercising.  Notes eating more, checking fsbs less, typically 108-120s.  Patient notes left foot plantar fasciitis causing pain.  Wearing tennis shoes to work, but does lots of standing as a Runner, broadcasting/film/video.  Notices numbness in fifth digit of left foot.  Sensation relieved by removing shoe.  Pt notes mild buzzing in the ears mostly at night, intermittent times weeks.  Drinking 2 bottles of water per day.  Denies CP, palpitations, recent illness.  Patient inquires about shingles vaccine.  Social History   Socioeconomic History  . Marital status: Married    Spouse name: Shontay Wallner  . Number of children: 2  . Years of education: Not on file  . Highest education level: Not on file  Occupational History  . Occupation: Educator  Tobacco Use  . Smoking status: Never Smoker  . Smokeless tobacco: Never Used  Vaping Use  . Vaping Use: Never used  Substance and Sexual Activity  . Alcohol use: Yes    Alcohol/week: 0.0 standard drinks    Comment: Rare  . Drug use: No  . Sexual activity: Yes    Birth control/protection: I.U.D.    Comment: Mirena 01/05/2019-1st intercourse 55 yo-Fewer than 5 partners  Other Topics Concern  . 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 Hepatitis C Screening  Never done  . FOOT EXAM  07/15/2018  . OPHTHALMOLOGY EXAM  02/14/2019  . HEMOGLOBIN A1C  06/15/2019  . HIV Screening  07/27/2024 (Originally 08/16/1980)  . Fecal DNA (Cologuard)  08/05/2020  . MAMMOGRAM  02/01/2022  . PAP SMEAR-Modifier  10/20/2023  . TETANUS/TDAP  07/27/2024  . INFLUENZA VACCINE  Completed  . PNEUMOCOCCAL POLYSACCHARIDE VACCINE AGE 51-64 HIGH RISK  Completed  . COVID-19 Vaccine  Completed    The following portions of the patient's history were reviewed and updated as appropriate: allergies, current medications, past family history, past medical history, past social history, past surgical history and problem list.  Review of Systems Pertinent items noted in HPI and remainder of comprehensive ROS otherwise negative.   Objective:    BP 118/80 (BP Location: Left Wrist, Patient Position: Sitting, Cuff Size: Large)   Pulse 72   Temp 97.7 F (36.5 C) (Oral)   Ht 5' 5.5" (1.664 m)   Wt (!) 348 lb (157.9 kg)   SpO2 99%   BMI 57.03 kg/m  General appearance: alert, cooperative and no distress Head: Normocephalic, without obvious abnormality, atraumatic Eyes: conjunctivae/corneas clear. PERRL, EOM's intact. Fundi benign. Ears: normal TM's and external ear canals both ears Nose: Nares normal. Septum midline. Mucosa  normal. No drainage or sinus tenderness. Throat: lips, mucosa, and tongue normal; teeth and gums normal Neck: no adenopathy, no carotid bruit, no JVD, supple, symmetrical, trachea midline and Mild enlargement of R neck>L neck, thyroid smooth without nodules Lungs: clear to auscultation bilaterally Heart: regular rate and rhythm, S1, S2 normal, no murmur, click, rub or gallop Abdomen: soft, non-tender; bowel sounds normal; no masses,  no organomegaly Extremities: extremities normal, atraumatic, no cyanosis or edema Pulses: 2+ and symmetric Skin: Skin color, texture, turgor normal. No rashes  or lesions skin tags and moles noted lymph nodes: Cervical, supraclavicular, and axillary nodes normal. Neurologic: Alert and oriented X 3, normal strength and tone. Normal symmetric reflexes. Normal coordination and gait    Diabetic Foot Exam - Simple   Simple Foot Form Diabetic Foot exam was performed with the following findings: Yes 04/07/2020  9:01 AM  Visual Inspection No deformities, no ulcerations, no other skin breakdown bilaterally: Yes Sensation Testing Intact to touch and monofilament testing bilaterally: Yes Pulse Check Posterior Tibialis and Dorsalis pulse intact bilaterally: Yes Comments Mild callus on bilateral heels.       Assessment:    Healthy female exam with plantar fasciitis, tenderness, and numbness in left fifth digit foot.     Plan:     Anticipatory guidance given including wearing seatbelts, smoke detectors in the home, increasing physical activity, increasing p.o. intake of water and vegetables. -will obtain labs -Cologuard done in 2017. -Mammogram done 02/02/2020 -Given handout -Next CPE in 1 year -See After Visit Summary for Counseling Recommendations    Plantar fasciitis -Supportive care including stretching exercises, massage -Given handout - Plan: CBC with Differential/Platelet  Tinnitus, unspecified laterality -Passed hearing test this visit -We will obtain labs -For continued or worsening symptoms referral to ENT - Plan: TSH, T4, Free, VITAMIN D 25 Hydroxy (Vit-D Deficiency, Fractures), Vitamin B12, Folate  Essential hypertension -Controlled -Continue current medications including losartan 100 mg, metoprolol succinate 50 mg, Lasix 40 mg - Plan: CBC with Differential/Platelet, Basic metabolic panel  Mixed hyperlipidemia -Continue Lipitor 20 mg daily -Continue lifestyle modification  - Plan: Lipid panel  Type 2 diabetes mellitus with other specified complication, without long-term current use of insulin (HCC)  -Hemoglobin A1c was  5.7% on 12/16/2018 -Discussed the importance of lifestyle modifications -Continue current medications including Metformin 1000 mg twice daily -Foot exam done this visit - Plan: CBC with Differential/Platelet, Hemoglobin A1c  Numbness -Likely 2/2 superficial nerve compression -Discussed wearing shoes with room in the toe box -We will obtain lab - Plan: Vitamin B12, Folate  Class 3 severe obesity due to excess calories with serious comorbidity and body mass index (BMI) of 50.0 to 59.9 in adult [facility]) -Discussed the importance of lifestyle modifications -Given handout  Need for shingles vaccine -Shingles vaccine given this visit  F/u prn  Abbe Amsterdam, MD

## 2020-04-19 ENCOUNTER — Other Ambulatory Visit: Payer: Self-pay | Admitting: Family Medicine

## 2020-04-20 ENCOUNTER — Other Ambulatory Visit: Payer: Self-pay | Admitting: Family Medicine

## 2020-04-20 DIAGNOSIS — E119 Type 2 diabetes mellitus without complications: Secondary | ICD-10-CM

## 2020-04-22 ENCOUNTER — Other Ambulatory Visit: Payer: Self-pay | Admitting: Family Medicine

## 2020-04-24 ENCOUNTER — Telehealth: Payer: Self-pay | Admitting: Family Medicine

## 2020-04-24 NOTE — Telephone Encounter (Signed)
Pt called 04/24/2020 and said she received a bill because there was no insurance on file; pt said she came in 04/07/2020  to see Dr. Salomon Fick, and she provided her insurance information.  Pt contact [phone]

## 2020-05-02 NOTE — Progress Notes (Signed)
Results viewed on MyChart. 

## 2020-05-05 ENCOUNTER — Ambulatory Visit: Payer: BC Managed Care – PPO | Admitting: Cardiology

## 2020-05-14 IMAGING — MG DIGITAL SCREENING BILAT W/ TOMO W/ CAD
8 of 14 series · 8 of 40 positions shown · non-contrast
Comparison: Previous exam(s).

CLINICAL DATA: Screening.

EXAM:
DIGITAL SCREENING BILATERAL MAMMOGRAM WITH TOMO AND CAD

[L CC synth-2D]
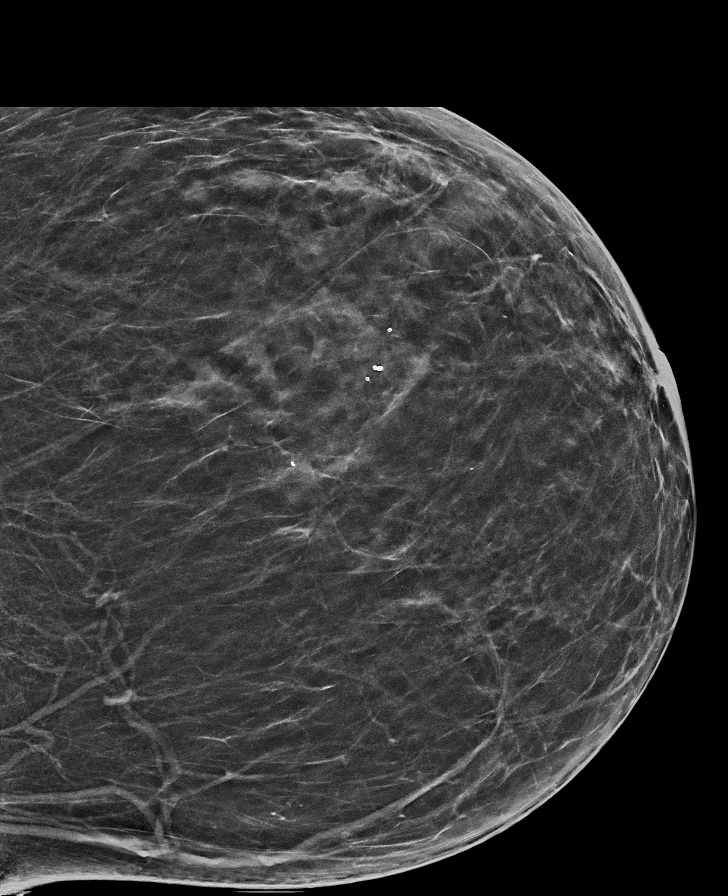

[L MLO synth-2D (1 of 2)]
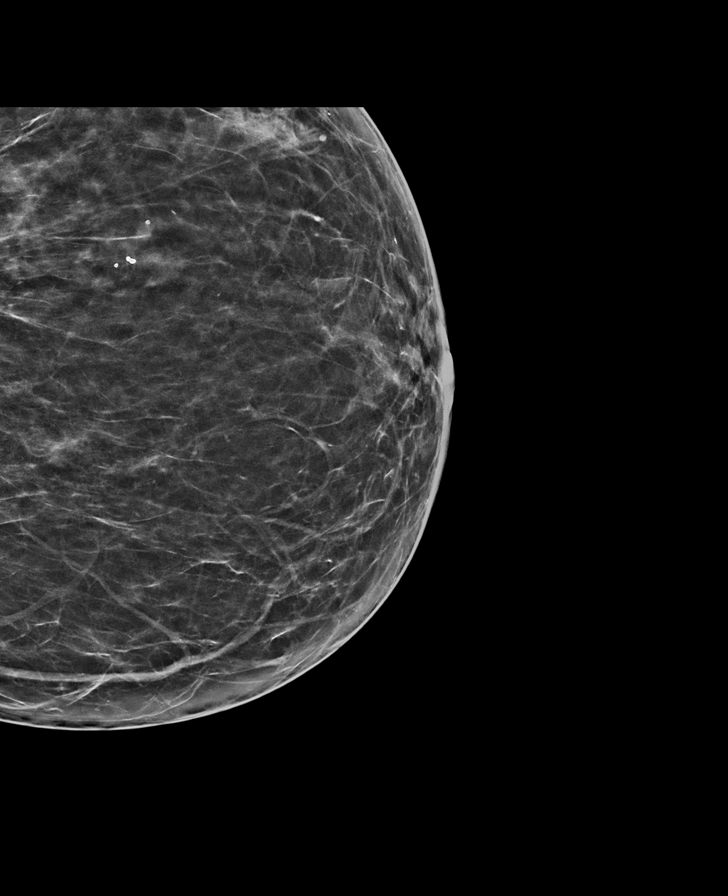

[R MLO synth-2D (1 of 2)]
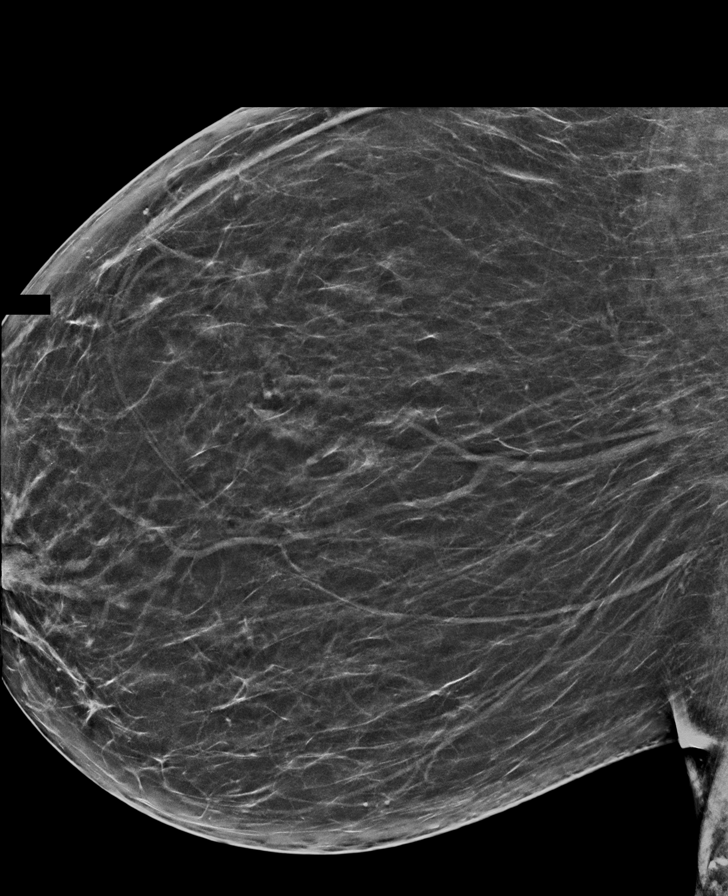

[R CC synth-2D (1 of 2)]
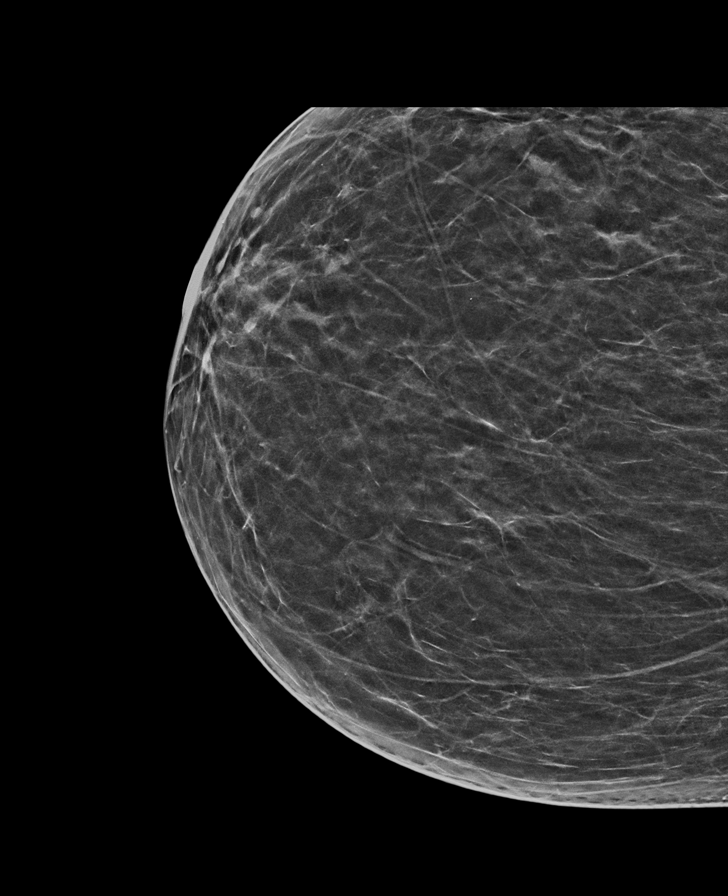

[R CC synth-2D (2 of 2)]
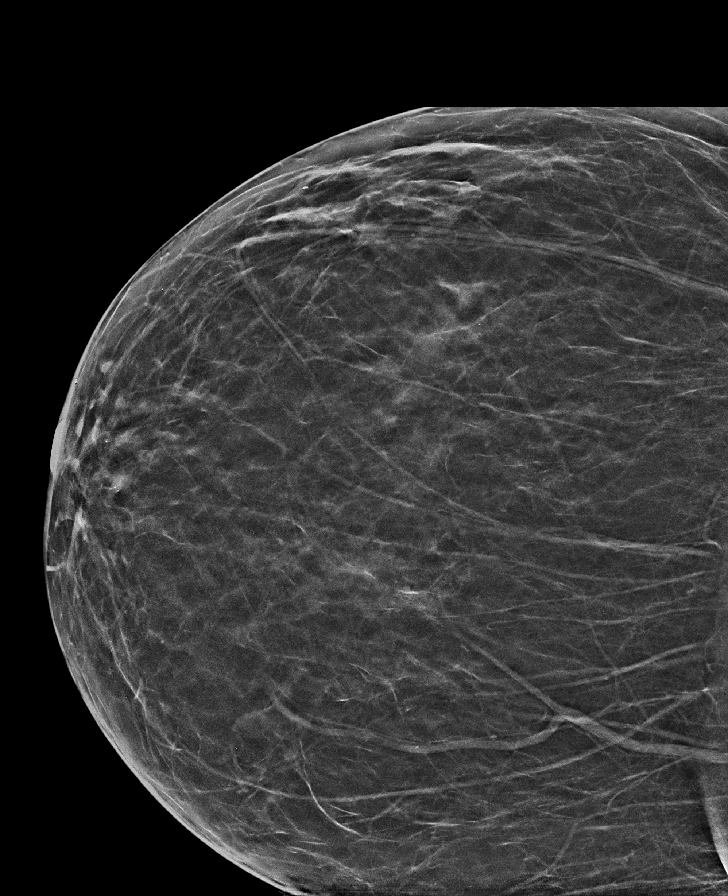

[L MLO synth-2D (2 of 2)]
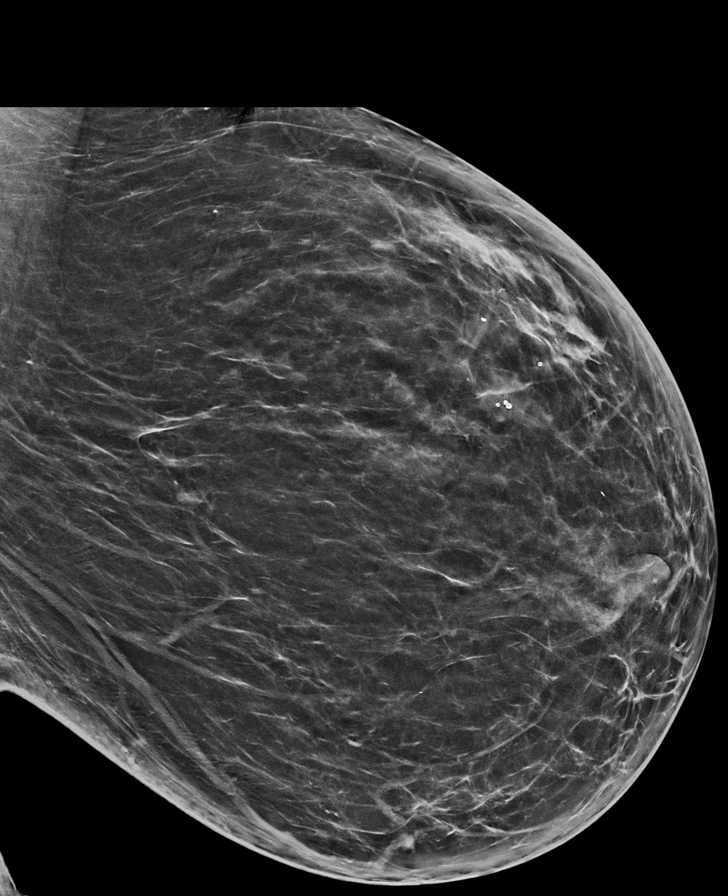

[R MLO synth-2D (2 of 2)]
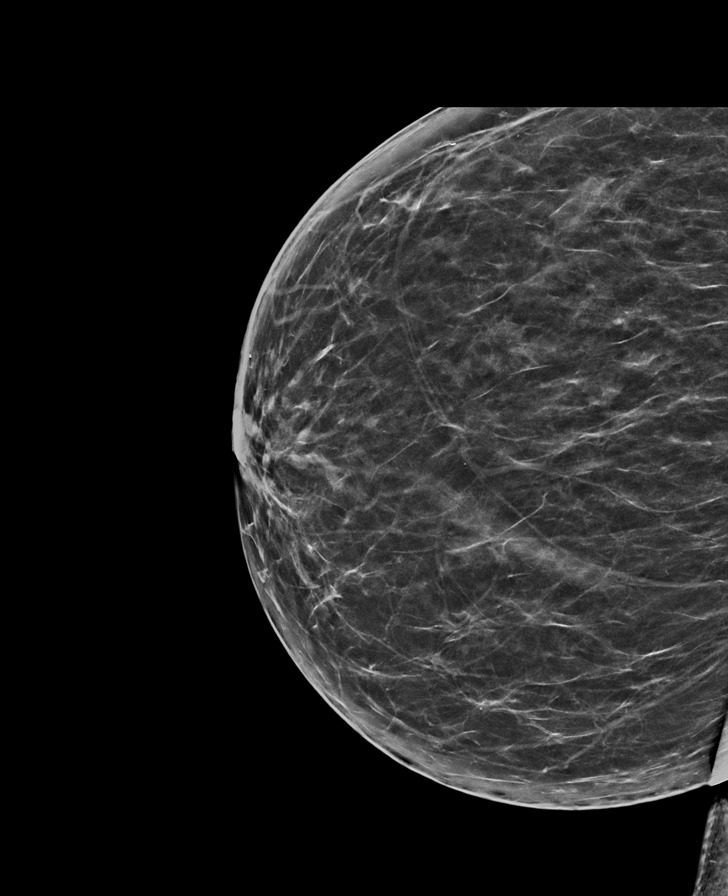

[L MLO tomo · tomo slice 35/69.0]
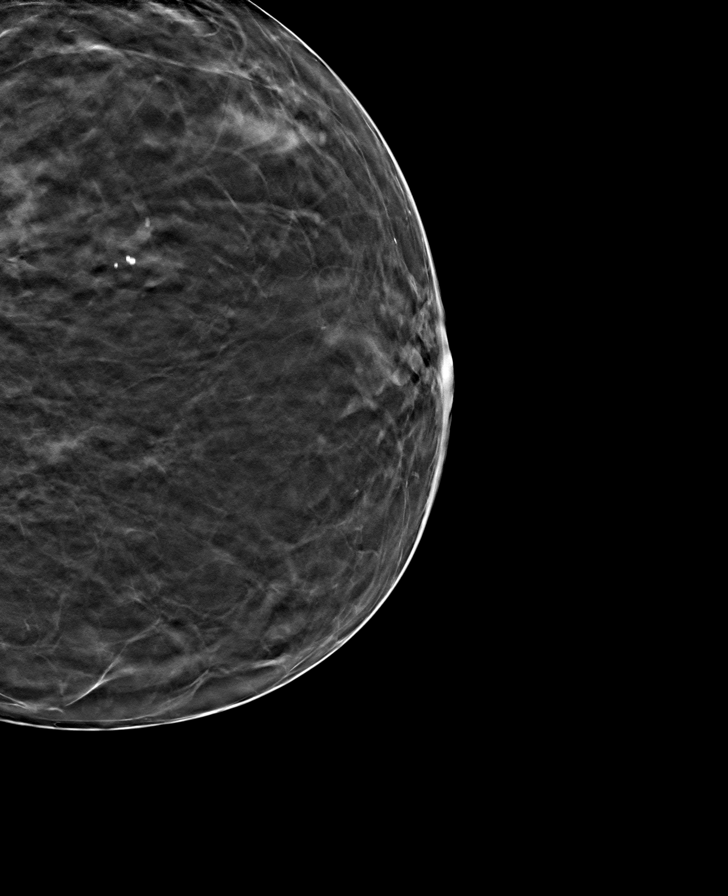

[8 of 40 positions shown; findings below may reference images not displayed]

ACR Breast Density Category b: There are scattered areas of
fibroglandular density.
FINDINGS: There are no findings suspicious for malignancy. Images were
processed with CAD.
IMPRESSION: No mammographic evidence of malignancy. A result letter of this
screening mammogram will be mailed directly to the patient.

RECOMMENDATION:
Screening mammogram in one year. (Code:[id number])

BI-RADS CATEGORY  1: Negative.

## 2020-05-18 NOTE — Progress Notes (Signed)
Cardiology Office Note   Date:  05/19/2020   ID:  [name], DOB [birth year], MRN [id number]  PCP:  Billie Ruddy, MD  Cardiologist:   No primary care provider on file.   Chief Complaint  Patient presents with  . Chronic Diastolic HF     History of Present Illness: [name] is a 55 y.o. female who presents for follow of diastolic dysfunction.    Since I last saw her she has had stress at home.  She is and family members take care of and has been able to exercise.  She has some stress eating and has gained weight.   The patient denies any new symptoms such as chest discomfort, neck or arm discomfort. There has been no new shortness of breath, PND or orthopnea. There have been no reported palpitations, presyncope or syncope.   Past Medical History:  Diagnosis Date  . Chronic diastolic HF (heart failure) ([place])   . Diabetes ([place])   . GERD (gastroesophageal reflux disease)   . Hyperlipemia   . Hypertension   . Knee pain   . Obesity   . Sleep apnea   . Venous insufficiency     Past Surgical History:  Procedure Laterality Date  . Birth [place] removed    . CHOLECYSTECTOMY    . INTRAUTERINE DEVICE INSERTION  01/05/2019   Mirena     Current Outpatient Medications  Medication Sig Dispense Refill  . aspirin 81 MG EC tablet TAKE 1 TABLET BY MOUTH EVERY DAY 90 tablet 1  . atorvastatin (LIPITOR) 20 MG tablet TAKE 1 TABLET BY MOUTH EVERY DAY 90 tablet 3  . blood glucose meter kit and supplies Dispense based on patient and insurance preference. Use up to twice daily as directed. (FOR ICD-10 E10.9, E11.9). 1 each 0  . furosemide (LASIX) 40 MG tablet TAKE 1 TABLET BY MOUTH EVERY DAY 90 tablet 0  . Lancets (ONETOUCH DELICA PLUS [id number]) MISC USE TO check blood sugar UP TO TWICE DAILY AS DIRECTED 100 each 3  . levonorgestrel (MIRENA) 20 MCG/24HR IUD by Intrauterine route.    [name] losartan (COZAAR) 100 MG tablet TAKE ONE TABLET BY MOUTH ONCE DAILY 90 tablet 0  .  metFORMIN (GLUCOPHAGE) 1000 MG tablet TAKE 1 TABLET (1,000 MG TOTAL) BY MOUTH 2 (TWO) TIMES DAILY WITH A MEAL. 180 tablet 3  . metoprolol succinate (TOPROL-XL) 50 MG 24 hr tablet TAKE ONE TABLET BY MOUTH ONCE DAILY WITH FOOD 90 tablet 0  . ONETOUCH ULTRA test strip USE AS INSTRUCTED 100 strip 1  . Polyethyl Glycol-Propyl Glycol 0.4-0.3 % SOLN Apply to eye.    . Vitamin D, Cholecalciferol, 50 MCG (2000 UT) CAPS Take 1 capsule by mouth daily.     Current Facility-Administered Medications  Medication Dose Route Frequency Provider Last Rate Last Admin  . levonorgestrel (MIRENA) 20 MCG/24HR IUD   Intrauterine Once [name], [name], MD        Allergies:   Patient has no known allergies.     ROS:  Please see the history of present illness.   Otherwise, review of systems are positive for none.   All other systems are reviewed and negative.    PHYSICAL EXAM: VS:  BP 138/78   Pulse 71   Ht 5' 5.5" (1.664 m)   Wt (!) 350 lb (158.8 kg)   BMI 57.36 kg/m  , BMI Body mass index is 57.36 kg/m. GENERAL:  Well appearing NECK:  No jugular venous distention,  waveform within normal limits, carotid upstroke brisk and symmetric, no bruits, no thyromegaly LUNGS:  Clear to auscultation bilaterally CHEST:  Unremarkable HEART:  PMI not displaced or sustained,S1 and S2 within normal limits, no S3, no S4, no clicks, no rubs, soft left upper sternal border systolic murmur nonradiating, brief, no diastolic murmurs ABD:  Flat, positive bowel sounds normal in frequency in pitch, no bruits, no rebound, no guarding, no midline pulsatile mass, no hepatomegaly, no splenomegaly EXT:  2 plus pulses throughout, no edema, no cyanosis no clubbing     EKG:  EKG is  ordered today. Sinus rhythm, rate 71, axis within normal limits, intervals within normal limits, no acute ST-T wave changes.   Recent Labs: 04/07/2020: BUN 17; Creatinine, Ser 0.62; Hemoglobin 13.0; Platelets 233.0; Potassium 4.1; Sodium 141; TSH 2.45     Lipid Panel    Component Value Date/Time   CHOL 138 04/07/2020 0856   CHOL 133 12/16/2018 0817   TRIG 82.0 04/07/2020 0856   HDL 56.90 04/07/2020 0856   HDL 53 12/16/2018 0817   CHOLHDL 2 04/07/2020 0856   VLDL 16.4 04/07/2020 0856   LDLCALC 64 04/07/2020 0856   LDLCALC 66 12/16/2018 0817      Wt Readings from Last 3 Encounters:  05/19/20 (!) 350 lb (158.8 kg)  04/07/20 (!) 348 lb (157.9 kg)  11/12/19 (!) 335 lb (152 kg)      Other studies Reviewed: Additional studies/ records that were reviewed today include: Labs Review of the above records demonstrates:   See below   ASSESSMENT AND PLAN:  ACUTE ON CHRONIC DIASTOLIC DYSFUNCTION:   She seems to be euvolemic.  No change in therapy is indicated.  Start by primary risk reduction.  She will continue the meds as listed.  SLEEP APNEA:   She had sleep apnea treated with CPAP.  She sees Dr. Claiborne Billings.     OBESITY:    We talked about strategies for weight loss with diet and exercise.  HTN:   Her blood pressure is controlled.  She will continue the meds as listed.   DM:  A1c was A1c was 6.1.  No change in therapy.   Current medicines are reviewed at length with the patient today.  The patient does not have concerns regarding medicines.  The following changes have been made:  None  Labs/ tests ordered today include:   None   Orders Placed This Encounter  Procedures  . EKG 12-Lead     Disposition:   FU with me in 12 months.     Signed, Minus Breeding, MD  05/19/2020 5:43 PM    [facility]

## 2020-05-19 ENCOUNTER — Ambulatory Visit: Payer: BC Managed Care – PPO | Admitting: Cardiology

## 2020-05-19 ENCOUNTER — Encounter: Payer: Self-pay | Admitting: Cardiology

## 2020-05-19 ENCOUNTER — Other Ambulatory Visit: Payer: Self-pay

## 2020-05-19 VITALS — BP 138/78 | HR 71 | Ht 65.5 in | Wt 350.0 lb

## 2020-05-19 DIAGNOSIS — I5033 Acute on chronic diastolic (congestive) heart failure: Secondary | ICD-10-CM

## 2020-05-19 DIAGNOSIS — I10 Essential (primary) hypertension: Secondary | ICD-10-CM | POA: Diagnosis not present

## 2020-05-19 DIAGNOSIS — E118 Type 2 diabetes mellitus with unspecified complications: Secondary | ICD-10-CM

## 2020-05-19 NOTE — Patient Instructions (Signed)
Medication Instructions:  Continue current medications  *If you need a refill on your cardiac medications before your next appointment, please call your pharmacy*   Lab Work: None Ordered   Testing/Procedures: None Ordered   Follow-Up: At [facility], you and your health needs are our priority.  As part of our continuing mission to provide you with exceptional heart care, we have created designated Provider Care Teams.  These Care Teams include your primary Cardiologist (physician) and Advanced Practice Providers (APPs -  Physician Assistants and Nurse Practitioners) who all work together to provide you with the care you need, when you need it.  We recommend signing up for the patient portal called "MyChart".  Sign up information is provided on this After Visit Summary.  MyChart is used to connect with patients for Virtual Visits (Telemedicine).  Patients are able to view lab/test results, encounter notes, upcoming appointments, etc.  Non-urgent messages can be sent to your provider as well.   To learn more about what you can do with MyChart, go to [email or web address].    Your next appointment:   1 year(s)  The format for your next appointment:   In Person  Provider:   You may see James Hochrein, MD or one of the following Advanced Practice Providers on your designated Care Team:    Rhonda Barrett, PA-C  Kathryn Lawrence, DNP, ANP     

## 2020-05-21 ENCOUNTER — Other Ambulatory Visit: Payer: Self-pay | Admitting: Family Medicine

## 2020-06-19 ENCOUNTER — Other Ambulatory Visit: Payer: Self-pay

## 2020-06-19 DIAGNOSIS — I10 Essential (primary) hypertension: Secondary | ICD-10-CM

## 2020-06-19 MED ORDER — LOSARTAN POTASSIUM 100 MG PO TABS: 1.0000 | ORAL_TABLET | Freq: Every day | ORAL | 2 refills | Status: DC

## 2020-06-19 MED ORDER — METOPROLOL SUCCINATE ER 50 MG PO TB24: ORAL_TABLET | ORAL | 2 refills | Status: DC

## 2020-08-01 ENCOUNTER — Ambulatory Visit: Payer: BC Managed Care – PPO

## 2020-09-12 ENCOUNTER — Ambulatory Visit (INDEPENDENT_AMBULATORY_CARE_PROVIDER_SITE_OTHER): Payer: BC Managed Care – PPO

## 2020-09-12 ENCOUNTER — Other Ambulatory Visit: Payer: Self-pay

## 2020-09-12 DIAGNOSIS — Z23 Encounter for immunization: Secondary | ICD-10-CM

## 2020-09-12 NOTE — Progress Notes (Signed)
Per orders from Dr Salomon Fick, pt was given second Shingles vaccine by Steffanie Rainwater, pt tolerated well.

## 2020-09-30 ENCOUNTER — Other Ambulatory Visit: Payer: Self-pay | Admitting: Family Medicine

## 2020-11-16 ENCOUNTER — Ambulatory Visit: Payer: BC Managed Care – PPO | Admitting: Nurse Practitioner

## 2020-11-16 ENCOUNTER — Ambulatory Visit: Payer: BC Managed Care – PPO | Admitting: Obstetrics and Gynecology

## 2020-12-23 ENCOUNTER — Other Ambulatory Visit: Payer: Self-pay | Admitting: Family Medicine

## 2021-03-03 ENCOUNTER — Other Ambulatory Visit: Payer: Self-pay | Admitting: Family Medicine

## 2021-03-03 DIAGNOSIS — I10 Essential (primary) hypertension: Secondary | ICD-10-CM

## 2021-03-05 ENCOUNTER — Other Ambulatory Visit: Payer: Self-pay

## 2021-03-05 ENCOUNTER — Ambulatory Visit (HOSPITAL_COMMUNITY)
Admission: RE | Admit: 2021-03-05 | Discharge: 2021-03-05 | Disposition: A | Discharge status: Home / Self Care | Payer: BC Managed Care – PPO | Source: Ambulatory Visit | Attending: Family Medicine | Admitting: Family Medicine

## 2021-03-05 ENCOUNTER — Other Ambulatory Visit (HOSPITAL_COMMUNITY): Payer: Self-pay | Admitting: Family Medicine

## 2021-03-05 DIAGNOSIS — Z1231 Encounter for screening mammogram for malignant neoplasm of breast: Secondary | ICD-10-CM

## 2021-04-09 ENCOUNTER — Encounter: Payer: BC Managed Care – PPO | Admitting: Family Medicine

## 2021-04-13 ENCOUNTER — Encounter: Payer: Self-pay | Admitting: Family Medicine

## 2021-04-28 ENCOUNTER — Other Ambulatory Visit: Payer: Self-pay | Admitting: Family Medicine

## 2021-05-05 LAB — COLOGUARD: COLOGUARD: NEGATIVE

## 2021-05-28 ENCOUNTER — Other Ambulatory Visit: Payer: Self-pay | Admitting: Family Medicine

## 2021-05-28 DIAGNOSIS — I10 Essential (primary) hypertension: Secondary | ICD-10-CM

## 2021-06-16 ENCOUNTER — Other Ambulatory Visit: Payer: Self-pay | Admitting: Family Medicine

## 2021-06-20 ENCOUNTER — Other Ambulatory Visit: Payer: Self-pay | Admitting: Family Medicine

## 2021-06-22 ENCOUNTER — Other Ambulatory Visit (INDEPENDENT_AMBULATORY_CARE_PROVIDER_SITE_OTHER): Payer: Self-pay | Admitting: Family Medicine

## 2021-06-22 DIAGNOSIS — E119 Type 2 diabetes mellitus without complications: Secondary | ICD-10-CM

## 2021-07-25 ENCOUNTER — Other Ambulatory Visit: Payer: Self-pay | Admitting: Family Medicine

## 2021-07-27 ENCOUNTER — Ambulatory Visit: Payer: BC Managed Care – PPO | Admitting: Cardiology

## 2021-08-16 NOTE — Progress Notes (Signed)
Cardiology Office Note   Date:  08/17/2021   ID:  Niang, Mitcheltree 18-Apr-1965, MRN [id number]  PCP:  Billie Ruddy, MD  Cardiologist:   None   Chief Complaint  Patient presents with   Edema     History of Present Illness: [name] is a 56 y.o. female who presents for follow of diastolic dysfunction.    Since I last saw her she has done well from a cardiovascular standpoint.  She had no new volume issues.  She is a high Education officer, [employer].  The patient denies any new symptoms such as chest discomfort, neck or arm discomfort. There has been no new shortness of breath, PND or orthopnea. There have been no reported palpitations, presyncope or syncope.    Past Medical History:  Diagnosis Date   Chronic diastolic HF (heart failure) (HCC)    Diabetes (HCC)    GERD (gastroesophageal reflux disease)    Hyperlipemia    Hypertension    Knee pain    Obesity    Sleep apnea    Venous insufficiency     Past Surgical History:  Procedure Laterality Date   Birth Elta [place] removed     CHOLECYSTECTOMY     INTRAUTERINE DEVICE INSERTION  01/05/2019   Mirena     Current Outpatient Medications  Medication Sig Dispense Refill   atorvastatin (LIPITOR) 20 MG tablet TAKE 1 TABLET BY MOUTH EVERY DAY 90 tablet 0   blood glucose meter kit and supplies Dispense based on patient and insurance preference. Use up to twice daily as directed. (FOR ICD-10 E10.9, E11.9). 1 each 0   cetirizine (ZYRTEC) 10 MG tablet      Cyanocobalamin (VITAMIN B 12 PO)      fluticasone (FLONASE) 50 MCG/ACT nasal spray Place into both nostrils as needed for allergies or rhinitis.     furosemide (LASIX) 40 MG tablet TAKE 1 TABLET BY MOUTH EVERY DAY 90 tablet 2   Glucosamine-Chondroitin-Vit D3 1500-1200-800 MG-MG-UNIT PACK      glucose blood (ONETOUCH ULTRA) test strip USE TO TEST SUGAR DAILY AS INSTRUCTED. SCHEDULE APPT FOR FUTURE REFILLS 100 strip 0   Lancets (ONETOUCH DELICA PLUS  [id number]) MISC USE TO check blood sugar UP TO TWICE DAILY AS DIRECTED 100 each 3   levonorgestrel (MIRENA) 20 MCG/24HR IUD by Intrauterine route.     losartan (COZAAR) 100 MG tablet TAKE ONE TABLET BY MOUTH ONCE DAILY 90 tablet 1   metFORMIN (GLUCOPHAGE) 1000 MG tablet TAKE 1 TABLET (1,000 MG TOTAL) BY MOUTH 2 (TWO) TIMES DAILY WITH A MEAL. 180 tablet 1   metoprolol succinate (TOPROL-XL) 50 MG 24 hr tablet TAKE ONE TABLET BY MOUTH ONCE DAILY WITH FOOD 90 tablet 1   Polyethyl Glycol-Propyl Glycol 0.4-0.3 % SOLN Apply to eye.     Vitamin D, Cholecalciferol, 50 MCG (2000 UT) CAPS Take 1 capsule by mouth daily.     Current Facility-Administered Medications  Medication Dose Route Frequency Provider Last Rate Last Admin   levonorgestrel (MIRENA) 20 MCG/24HR IUD   Intrauterine Once [name], [name], MD        Allergies:   Patient has no known allergies.     ROS:  Please see the history of present illness.   Otherwise, review of systems are positive for tinnitus.   All other systems are reviewed and negative.    PHYSICAL EXAM: VS:  BP (!) 148/84   Pulse 63   Ht _0  (1.651 m)  Wt (!) 368 lb 8 oz (167.2 kg)   SpO2 99%   BMI 61.32 kg/m  , BMI Body mass index is 61.32 kg/m. GENERAL:  Well appearing NECK:  No jugular venous distention, waveform within normal limits, carotid upstroke brisk and symmetric, no bruits, no thyromegaly LUNGS:  Clear to auscultation bilaterally CHEST:  Unremarkable HEART:  PMI not displaced or sustained,S1 and S2 within normal limits, no S3, no S4, no clicks, no rubs, no murmurs ABD:  Flat, positive bowel sounds normal in frequency in pitch, no bruits, no rebound, no guarding, no midline pulsatile mass, no hepatomegaly, no splenomegaly EXT:  2 plus pulses throughout, no edema, no cyanosis no clubbing    EKG:  EKG is  ordered today. Sinus rhythm, rate 63, axis within normal limits, intervals within normal limits, no acute ST-T wave changes.   Recent  Labs: No results found for requested labs within last 365 days.    Lipid Panel    Component Value Date/Time   CHOL 138 04/07/2020 0856   CHOL 133 12/16/2018 0817   TRIG 82.0 04/07/2020 0856   HDL 56.90 04/07/2020 0856   HDL 53 12/16/2018 0817   CHOLHDL 2 04/07/2020 0856   VLDL 16.4 04/07/2020 0856   LDLCALC 64 04/07/2020 0856   LDLCALC 66 12/16/2018 0817      Wt Readings from Last 3 Encounters:  08/17/21 (!) 368 lb 8 oz (167.2 kg)  05/19/20 (!) 350 lb (158.8 kg)  04/07/20 (!) 348 lb (157.9 kg)      Other studies Reviewed: Additional studies/ records that were reviewed today include: None Review of the above records demonstrates:   NA  ASSESSMENT AND PLAN:  CHRONIC DIASTOLIC DYSFUNCTION:   She has been euvolemic.  We talked about as needed dosing of her diuretic.  We talked about salt and fluid restriction.  No change in therapy.   SLEEP APNEA:   She had sleep apnea treated with CPAP.    OBESITY:    She is going to talk to her doctor about Mancel Parsons a reasonable choice for her.   HTN:   Her blood pressure is mildly elevated.  She is going to work on the weight loss and keep an eye on her blood pressure and she might end up titration of her meds.   DM:  A1c was A1c was ordered   by meCurrent medicines are reviewed at length with the patient today.  The patient does not have concerns regarding medicines.  The following changes have been made:   Labs/ tests ordered today include:    None   Orders Placed This Encounter  Procedures   EKG 12-Lead     Disposition:   FU with me as needed.   Signed, Minus Breeding, MD  08/17/2021 10:06 AM    [place]

## 2021-08-17 ENCOUNTER — Ambulatory Visit: Payer: BC Managed Care – PPO | Admitting: Cardiology

## 2021-08-17 ENCOUNTER — Encounter: Payer: Self-pay | Admitting: Cardiology

## 2021-08-17 VITALS — BP 148/84 | HR 63 | Ht 65.0 in | Wt 368.5 lb

## 2021-08-17 DIAGNOSIS — E118 Type 2 diabetes mellitus with unspecified complications: Secondary | ICD-10-CM | POA: Diagnosis not present

## 2021-08-17 DIAGNOSIS — I10 Essential (primary) hypertension: Secondary | ICD-10-CM | POA: Diagnosis not present

## 2021-08-17 DIAGNOSIS — I5032 Chronic diastolic (congestive) heart failure: Secondary | ICD-10-CM

## 2021-08-17 NOTE — Patient Instructions (Signed)
  Follow-Up: At [facility], you and your health needs are our priority.  As part of our continuing mission to provide you with exceptional heart care, we have created designated Provider Care Teams.  These Care Teams include your primary Cardiologist (physician) and Advanced Practice Providers (APPs -  Physician Assistants and Nurse Practitioners) who all work together to provide you with the care you need, when you need it.  We recommend signing up for the patient portal called "MyChart".  Sign up information is provided on this After Visit Summary.  MyChart is used to connect with patients for Virtual Visits (Telemedicine).  Patients are able to view lab/test results, encounter notes, upcoming appointments, etc.  Non-urgent messages can be sent to your provider as well.   To learn more about what you can do with MyChart, go to [email or web address].    Your next appointment:    AS NEEDED  Important Information About Sugar       

## 2021-08-22 ENCOUNTER — Ambulatory Visit (INDEPENDENT_AMBULATORY_CARE_PROVIDER_SITE_OTHER): Payer: BC Managed Care – PPO | Admitting: Family Medicine

## 2021-08-22 ENCOUNTER — Encounter: Payer: Self-pay | Admitting: Family Medicine

## 2021-08-22 VITALS — BP 134/90 | HR 64 | Temp 97.9°F | Ht 65.0 in | Wt 371.0 lb

## 2021-08-22 DIAGNOSIS — S61216A Laceration without foreign body of right little finger without damage to nail, initial encounter: Secondary | ICD-10-CM

## 2021-08-22 DIAGNOSIS — E119 Type 2 diabetes mellitus without complications: Secondary | ICD-10-CM | POA: Diagnosis not present

## 2021-08-22 DIAGNOSIS — I10 Essential (primary) hypertension: Secondary | ICD-10-CM | POA: Diagnosis not present

## 2021-08-22 DIAGNOSIS — Z1159 Encounter for screening for other viral diseases: Secondary | ICD-10-CM | POA: Diagnosis not present

## 2021-08-22 DIAGNOSIS — Z0000 Encounter for general adult medical examination without abnormal findings: Secondary | ICD-10-CM | POA: Diagnosis not present

## 2021-08-22 LAB — LIPID PANEL
Cholesterol: 143 mg/dL (ref 0–200)
HDL: 59.3 mg/dL (ref >39.00)
LDL Cholesterol: 69 mg/dL (ref 0–99)
NonHDL: 83.78
Total CHOL/HDL Ratio: 2
Triglycerides: 75 mg/dL (ref 0.0–149.0)
VLDL: 15 mg/dL (ref 0.0–40.0)

## 2021-08-22 LAB — CBC WITH DIFFERENTIAL/PLATELET
Basophils Absolute: 0 10*3/uL (ref 0.0–0.1)
Basophils Relative: 0.7 % (ref 0.0–3.0)
Eosinophils Absolute: 0.1 10*3/uL (ref 0.0–0.7)
Eosinophils Relative: 1.5 % (ref 0.0–5.0)
HCT: 39.3 % (ref 36.0–46.0)
Hemoglobin: 13 g/dL (ref 12.0–15.0)
Lymphocytes Relative: 25.6 % (ref 12.0–46.0)
Lymphs Abs: 1.7 10*3/uL (ref 0.7–4.0)
MCHC: 33.1 g/dL (ref 30.0–36.0)
MCV: 90.1 fl (ref 78.0–100.0)
Monocytes Absolute: 0.4 10*3/uL (ref 0.1–1.0)
Monocytes Relative: 6.2 % (ref 3.0–12.0)
Neutro Abs: 4.3 10*3/uL (ref 1.4–7.7)
Neutrophils Relative %: 66 % (ref 43.0–77.0)
Platelets: 256 10*3/uL (ref 150.0–400.0)
RBC: 4.37 Mil/uL (ref 3.87–5.11)
RDW: 14 % (ref 11.5–15.5)
WBC: 6.5 10*3/uL (ref 4.0–10.5)

## 2021-08-22 LAB — COMPREHENSIVE METABOLIC PANEL
ALT: 45 U/L — ABNORMAL HIGH (ref 0–35)
AST: 25 U/L (ref 0–37)
Albumin: 4.5 g/dL (ref 3.5–5.2)
Alkaline Phosphatase: 104 U/L (ref 39–117)
BUN: 19 mg/dL (ref 6–23)
CO2: 29 mEq/L (ref 19–32)
Calcium: 9.4 mg/dL (ref 8.4–10.5)
Chloride: 101 mEq/L (ref 96–112)
Creatinine, Ser: 0.61 mg/dL (ref 0.40–1.20)
GFR: 100.23 mL/min (ref >60.00)
Glucose, Bld: 108 mg/dL — ABNORMAL HIGH (ref 70–99)
Potassium: 3.8 mEq/L (ref 3.5–5.1)
Sodium: 139 mEq/L (ref 135–145)
Total Bilirubin: 0.5 mg/dL (ref 0.2–1.2)
Total Protein: 7.9 g/dL (ref 6.0–8.3)

## 2021-08-22 LAB — HEMOGLOBIN A1C: Hgb A1c MFr Bld: 6.7 % — ABNORMAL HIGH (ref 4.6–6.5)

## 2021-08-22 LAB — TSH: TSH: 2.08 u[IU]/mL (ref 0.35–5.50)

## 2021-08-22 LAB — T4, FREE: Free T4: 0.83 ng/dL (ref 0.60–1.60)

## 2021-08-22 LAB — VITAMIN D 25 HYDROXY (VIT D DEFICIENCY, FRACTURES): VITD: 53.54 ng/mL (ref 30.00–100.00)

## 2021-08-22 MED ORDER — ONETOUCH DELICA PLUS LANCET33G MISC: 3 refills | Status: DC

## 2021-08-22 NOTE — Patient Instructions (Signed)
A referral was place for weight management clinic.

## 2021-08-22 NOTE — Progress Notes (Signed)
Subjective:     [name] is a 56 y.o. female and is here for a comprehensive physical exam. The patient reports doing ok.  Patient interested in weight loss medication options.  Previously seen by weight management clinic.  Patient states she was successful while going to weight management however regained the weight.  Patient endorses stress eating.  Over the weekend patient right fifth digit while using a mandolin.  Endorses area bled for a while, but is healing.  Covered with Band-Aid, but found to get air.  Patient endorses compliance with BP meds.  States hemoglobin A1c likely elevated.  Previously 6.1%.  Mammogram done 03/05/2021.  Endorses normal Cologuard 05/05/2021.  Last Pap was with OB/GYN 2020.  Social History   Socioeconomic History   Marital status: Married    Spouse name: Laytoya Ion   Number of children: 2   Years of education: Not on file   Highest education level: Not on file  Occupational History   Occupation: Educator  Tobacco Use   Smoking status: Never   Smokeless tobacco: Never  Vaping Use   Vaping Use: Never used  Substance and Sexual Activity   Alcohol use: Yes    Alcohol/week: 0.0 standard drinks of alcohol    Comment: Rare   Drug use: No   Sexual activity: Yes    Birth control/protection: I.U.D.    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 - lives in [place]      Home Situation: lives with husband and 2 children 17 and 13 in 2016      Spiritual Beliefs: Christian      Lifestyle: no regular exercise, diet is poor      Social Determinants of Corporate investment banker Strain: Not on file  Food Insecurity: Not on file  Transportation Needs: Not on file  Physical Activity: Not on file  Stress: Not on file  Social Connections: Not on file  Intimate Partner Violence: Not on file    Health Maintenance  Topic Date Due   Hepatitis C Screening  Never done   OPHTHALMOLOGY EXAM  02/14/2019   HEMOGLOBIN A1C  10/05/2020   HIV Screening  07/27/2024 (Originally 08/16/1980)   INFLUENZA VACCINE  09/11/2021   FOOT EXAM  08/23/2022   MAMMOGRAM  03/06/2023   PAP SMEAR-Modifier  10/20/2023   Fecal DNA (Cologuard)  04/27/2024   TETANUS/TDAP  07/27/2024   COVID-19 Vaccine  Completed   Zoster Vaccines- Shingrix  Completed   HPV VACCINES  Aged Out    The following portions of the patient's history were reviewed and updated as appropriate: allergies, current medications, past family history, past medical history, past social history, past surgical history, and problem list.  Review of Systems Pertinent items noted in HPI and remainder of comprehensive ROS otherwise negative.   Objective:    BP 134/90 (BP Location: Left Arm, Cuff Size: Large)   Pulse 64   Temp 97.9 F (36.6 C) (Oral)   Ht 5\' 5"  (1.651 m)   Wt (!) 371 lb (168.3 kg)   SpO2 99%   BMI 61.74 kg/m  General appearance: alert, cooperative, and no distress Head: Normocephalic, without obvious abnormality, atraumatic Eyes: conjunctivae/corneas clear. PERRL, EOM's intact. Fundi benign. Ears: normal TM's and external ear canals both ears Nose: Nares normal. Septum midline. Mucosa normal. No drainage or  sinus tenderness. Throat: lips, mucosa, and tongue normal; teeth and gums normal Neck: no adenopathy, no carotid bruit, no JVD, supple, symmetrical, trachea midline, and thyroid not enlarged, symmetric, no tenderness/mass/nodules Lungs: clear to auscultation bilaterally Heart: regular rate and rhythm, S1, S2 normal, no murmur, click, rub or gallop Abdomen: soft, non-tender; bowel sounds normal; no masses,  no organomegaly Extremities: extremities normal, atraumatic, no cyanosis or edema Pulses: 2+ and symmetric Skin: Skin color, texture, turgor normal. No rashes or lesions lateral right fifth digit with healing  laceration, no drainage, no erythema.  Finger buddy taped to right fourth digit to avoid opening of wound. Lymph nodes: Cervical, supraclavicular, and axillary nodes normal. Neurologic: Alert and oriented X 3, normal strength and tone. Normal symmetric reflexes. Normal coordination and gait    Diabetic Foot Exam - Simple   Simple Foot Form Diabetic Foot exam was performed with the following findings: Yes 08/22/2021  9:50 AM  Visual Inspection No deformities, no ulcerations, no other skin breakdown bilaterally: Yes Sensation Testing Intact to touch and monofilament testing bilaterally: Yes Pulse Check Posterior Tibialis and Dorsalis pulse intact bilaterally: Yes Comments     Assessment:    Healthy female exam     Plan:    Anticipatory guidance given including wearing seatbelts, smoke detectors in the home, increasing physical activity, increasing p.o. intake of water and vegetables. -Obtain labs -Mammogram up-to-date done 03/05/2021 -Cologuard normal on 05/05/2021.  Repeat in 3 years -Pap done 10/20/2018.  Repeat due in 5 years, 2025. -Immunizations reviewed -Given handout -Next CPE in 1 year See After Visit Summary for Counseling Recommendations  - Plan: CBC with Differential/Platelet  Morbid obesity (HCC) -Body mass index is 61.74 kg/m. -Lifestyle modifications discussed -Consider counseling to evaluate relationship with food -Patient interested in starting injectable weight loss medication.  Education given on pen use. -Discussed sending patient to weight management for closer monitoring, meal plans, etc. as previously had good success.  - Plan: CBC with Differential/Platelet, Lipid panel, Vitamin D, 25-hydroxy, CMP, Amb Ref to Medical Weight Management  Type 2 diabetes mellitus without complication, without long-term current use of insulin (HCC)  -Hemoglobin A1c 6.1% on 04/07/2020 -Continue metformin 1000 mg twice daily -Continue checking blood sugar at home and keeping a  log to bring with you to clinic -Continue ARB and statin -Foot exam done this visit - Plan: Hemoglobin A1c, Lipid panel, Lancets (ONETOUCH DELICA PLUS LANCET33G) MISC  Essential hypertension  -Elevated -Recheck -Continue losartan 100 mg daily, Toprol-XL 50 mg daily, Lasix 40 mg daily - Plan: TSH, T4, Free, CMP  Encounter for hepatitis C screening test for low risk patient  - Plan: Hep C Antibody  Laceration of right little finger without damage to nail, foreign body presence unspecified, initial encounter -Hemostasis achieved -Right fifth digit buddy taped to right fourth digit to prevent opening of wound from flexing of finger -Tdap up-to-date, done 07/28/2014 -Monitor for signs and symptoms of infection -given strict precautions  Follow-up as needed  Abbe Amsterdam, MD

## 2021-08-23 LAB — HEPATITIS C ANTIBODY: Hepatitis C Ab: NONREACTIVE

## 2021-09-16 ENCOUNTER — Other Ambulatory Visit: Payer: Self-pay | Admitting: Family Medicine

## 2021-09-17 LAB — HM DIABETES EYE EXAM

## 2021-09-19 ENCOUNTER — Encounter (INDEPENDENT_AMBULATORY_CARE_PROVIDER_SITE_OTHER): Payer: Self-pay

## 2021-09-20 ENCOUNTER — Encounter: Payer: Self-pay | Admitting: Family Medicine

## 2021-09-24 ENCOUNTER — Other Ambulatory Visit: Payer: Self-pay | Admitting: Family Medicine

## 2021-10-22 ENCOUNTER — Other Ambulatory Visit: Payer: Self-pay | Admitting: Family Medicine

## 2021-11-03 ENCOUNTER — Other Ambulatory Visit: Payer: Self-pay | Admitting: Family Medicine

## 2021-11-10 ENCOUNTER — Other Ambulatory Visit: Payer: Self-pay | Admitting: Family Medicine

## 2021-11-23 ENCOUNTER — Other Ambulatory Visit: Payer: Self-pay | Admitting: Family Medicine

## 2021-11-23 DIAGNOSIS — I10 Essential (primary) hypertension: Secondary | ICD-10-CM

## 2022-02-03 ENCOUNTER — Other Ambulatory Visit: Payer: Self-pay | Admitting: Family Medicine

## 2022-03-22 ENCOUNTER — Encounter: Payer: Self-pay | Admitting: Family Medicine

## 2022-05-15 DIAGNOSIS — Z0289 Encounter for other administrative examinations: Secondary | ICD-10-CM

## 2022-05-23 ENCOUNTER — Other Ambulatory Visit: Payer: Self-pay | Admitting: Family Medicine

## 2022-05-23 DIAGNOSIS — I10 Essential (primary) hypertension: Secondary | ICD-10-CM

## 2022-06-09 ENCOUNTER — Other Ambulatory Visit: Payer: Self-pay | Admitting: Family Medicine

## 2022-06-11 ENCOUNTER — Encounter: Payer: Self-pay | Admitting: Bariatrics

## 2022-06-11 ENCOUNTER — Ambulatory Visit: Payer: BC Managed Care – PPO | Admitting: Bariatrics

## 2022-06-11 VITALS — BP 139/81 | HR 65 | Temp 97.9°F | Ht 65.0 in | Wt 369.0 lb

## 2022-06-11 DIAGNOSIS — E559 Vitamin D deficiency, unspecified: Secondary | ICD-10-CM

## 2022-06-11 DIAGNOSIS — E119 Type 2 diabetes mellitus without complications: Secondary | ICD-10-CM

## 2022-06-11 DIAGNOSIS — E785 Hyperlipidemia, unspecified: Secondary | ICD-10-CM

## 2022-06-11 DIAGNOSIS — R0602 Shortness of breath: Secondary | ICD-10-CM

## 2022-06-11 DIAGNOSIS — F32A Depression, unspecified: Secondary | ICD-10-CM

## 2022-06-11 DIAGNOSIS — I10 Essential (primary) hypertension: Secondary | ICD-10-CM | POA: Diagnosis not present

## 2022-06-11 DIAGNOSIS — Z6841 Body Mass Index (BMI) 40.0 and over, adult: Secondary | ICD-10-CM

## 2022-06-11 DIAGNOSIS — R5383 Other fatigue: Secondary | ICD-10-CM

## 2022-06-11 DIAGNOSIS — Z1331 Encounter for screening for depression: Secondary | ICD-10-CM

## 2022-06-11 DIAGNOSIS — E538 Deficiency of other specified B group vitamins: Secondary | ICD-10-CM

## 2022-06-11 DIAGNOSIS — E1169 Type 2 diabetes mellitus with other specified complication: Secondary | ICD-10-CM | POA: Diagnosis not present

## 2022-06-11 DIAGNOSIS — G4733 Obstructive sleep apnea (adult) (pediatric): Secondary | ICD-10-CM | POA: Diagnosis not present

## 2022-06-12 NOTE — Progress Notes (Signed)
Chief Complaint:   OBESITY [name] (MR# [id number]) is a 57 y.o. female who presents for evaluation and treatment of obesity and related comorbidities. Current BMI is Body mass index is 61.4 kg/m. Merlinda has been struggling with her weight for many years and has been unsuccessful in either losing weight, maintaining weight loss, or reaching her healthy weight goal.  Alayna is here for her initial visit.  She states that she likes to cook.  She craves sweets.  She was at the [facility] location.  Cinzia is currently in the action stage of change and ready to dedicate time achieving and maintaining a healthier weight. Jeanee is interested in becoming our patient and working on intensive lifestyle modifications including (but not limited to) diet and exercise for weight loss.  Alexina's habits were reviewed today and are as follows: Her family eats meals together, she thinks her family will eat healthier with her, she struggles with family and or coworkers weight loss sabotage, her desired weight loss is 189-199 lbs, she has been heavy most of her life, her heaviest weight ever was 385 pounds, she has significant food cravings issues, she is frequently drinking liquids with calories, she frequently makes poor food choices, she frequently eats larger portions than normal, and she struggles with emotional eating.  Depression Screen Michon's Food and Mood (modified PHQ-9) score was 11.  Subjective:   1. Other fatigue Aida admits to daytime somnolence and denies waking up still tired. Patient has a history of symptoms of daytime fatigue. Destanee generally gets 7 or 8 hours of sleep per night, and states that she has generally restful sleep. Snoring is not present. Apneic episodes are not present. Epworth Sleepiness Score is 1.   2. SOBOE (shortness of breath on exertion) Rosalita Chessman notes increasing shortness of breath with exercising and seems to be worsening over time with  weight gain. She notes getting out of breath sooner with activity than she used to. This has not gotten worse recently. Toniette denies shortness of breath at rest or orthopnea.  3. Type 2 diabetes mellitus without complication, without long-term current use of insulin (HCC) Eisa has a history of gestational diabetes mellitus with her second trimester.  She is on metformin, and her last A1c was 6.7.  4. Essential hypertension Kylei is taking Toprol, Cozaar, and Lasix.  Her blood pressure is elevated today at 157/79.  5. Hyperlipidemia associated with type 2 diabetes mellitus (HCC) Myliyah is taking Lipitor currently.  6. OSA (obstructive sleep apnea) Satomi is using her CPAP.  7. Vitamin D deficiency Lateisha is not on multivitamins.  8. Vitamin B 12 deficiency Jameya is not on multivitamins.  Assessment/Plan:   1. Other fatigue Naya does feel that her weight is causing her energy to be lower than it should be. Fatigue may be related to obesity, depression or many other causes. Labs will be ordered, and in the meanwhile, Avigayil will focus on self care including making healthy food choices, increasing physical activity and focusing on stress reduction.  - EKG 12-Lead - TSH+T4F+T3Free  2. SOBOE (shortness of breath on exertion) Dusty does feel that she gets out of breath more easily that she used to when she exercises. Wynn's shortness of breath appears to be obesity related and exercise induced. She has agreed to work on weight loss and gradually increase exercise to treat her exercise induced shortness of breath. Will continue to monitor closely.  - TSH+T4F+T3Free  3. Type 2 diabetes mellitus without  complication, without long-term current use of insulin (HCC) We will check labs today.  Sharion will continue her medications.  Information handout was provided on GLP-1 and was discussed.  - Hemoglobin A1c - Insulin, random - Microalbumin / creatinine urine ratio -  Comprehensive metabolic panel  4. Essential hypertension Josephyne will continue her medications as directed.  We will recheck her blood pressure at her next visit.  5. Hyperlipidemia associated with type 2 diabetes mellitus (HCC) We will check labs today, and Faatima will continue Lipitor.  - Lipid Panel With LDL/HDL Ratio  6. OSA (obstructive sleep apnea) Mishel will continue to use her CPAP.  7. Vitamin D deficiency We will check labs today, and we will follow-up at [facility] next visit.  - VITAMIN D 25 Hydroxy (Vit-D Deficiency, Fractures)  8. Vitamin B 12 deficiency We will check labs today, and we will follow-up at [facility] next visit.  - Vitamin B12  9. Depression screening Annaleah had a positive depression screening. Depression is commonly associated with obesity and often results in emotional eating behaviors. We will monitor this closely and work on CBT to help improve the non-hunger eating patterns. Referral to Psychology may be required if no improvement is seen as she continues in our clinic.  10. Morbid obesity (HCC)  11. BMI 60.0-69.9, adult [facility]) Oral is currently in the action stage of change and her goal is to continue with weight loss efforts. I recommend Leyla begin the structured treatment plan as follows:  She has agreed to the Category 4 Plan.  Meal planning and intentional eating were discussed.  Review labs with the patient from 08/22/2021, CMP, lipids, vitamin D, CBC, A1c, and thyroid panel.  Smart fruit options were given.  Exercise goals: No exercise has been prescribed at this time.   Behavioral modification strategies: increasing lean protein intake, decreasing simple carbohydrates, increasing vegetables, increasing water intake, decreasing eating out, no skipping meals, meal planning and cooking strategies, keeping healthy foods in the home, and planning for success.  She was informed of the importance of frequent follow-up visits to maximize  her success with intensive lifestyle modifications for her multiple health conditions. She was informed we would discuss her lab results at her next visit unless there is a critical issue that needs to be addressed sooner. Carmesha agreed to keep her next visit at the agreed upon time to discuss these results.  Objective:   Blood pressure 139/81, pulse 65, temperature 97.9 F (36.6 C), height 5\' 5"  (1.651 m), weight (!) 369 lb (167.4 kg), SpO2 98 %. Body mass index is 61.4 kg/m.  EKG: Normal sinus rhythm, rate 58 BPM. .  Indirect Calorimeter completed today shows a VO2 of 381 and a REE of 2635.  Her calculated basal metabolic rate is 1308 thus her basal metabolic rate is better than expected.  General: Cooperative, alert, well developed, in no acute distress. HEENT: Conjunctivae and lids unremarkable. Cardiovascular: Regular rhythm.  Lungs: Normal work of breathing. Neurologic: No focal deficits.   Lab Results  Component Value Date   CREATININE 0.61 08/22/2021   BUN 19 08/22/2021   NA 139 08/22/2021   K 3.8 08/22/2021   CL 101 08/22/2021   CO2 29 08/22/2021   Lab Results  Component Value Date   ALT 45 (H) 08/22/2021   AST 25 08/22/2021   ALKPHOS 104 08/22/2021   BILITOT 0.5 08/22/2021   Lab Results  Component Value Date   HGBA1C 6.7 (H) 08/22/2021   HGBA1C 6.1 04/07/2020  HGBA1C 5.7 (H) 12/16/2018   HGBA1C 5.4 08/10/2018   HGBA1C 6.0 (H) 02/12/2018   Lab Results  Component Value Date   INSULIN 8.9 02/12/2018   INSULIN 9.1 10/20/2017   Lab Results  Component Value Date   TSH 2.08 08/22/2021   Lab Results  Component Value Date   CHOL 143 08/22/2021   HDL 59.30 08/22/2021   LDLCALC 69 08/22/2021   TRIG 75.0 08/22/2021   CHOLHDL 2 08/22/2021   Lab Results  Component Value Date   WBC 6.5 08/22/2021   HGB 13.0 08/22/2021   HCT 39.3 08/22/2021   MCV 90.1 08/22/2021   PLT 256.0 08/22/2021   No results found for: "IRON", "TIBC", "FERRITIN"  Attestation  Statements:   Reviewed by clinician on day of visit: allergies, medications, problem list, medical history, surgical history, family history, social history, and previous encounter notes.   Trude Mcburney, am acting as Energy manager for [employer], DO.  I have reviewed the above documentation for accuracy and completeness, and I agree with the above. Corinna Capra, DO

## 2022-06-13 LAB — COMPREHENSIVE METABOLIC PANEL
ALT: 40 IU/L — ABNORMAL HIGH (ref 0–32)
AST: 21 IU/L (ref 0–40)
Albumin/Globulin Ratio: 1.7 (ref 1.2–2.2)
Albumin: 4.6 g/dL (ref 3.8–4.9)
Alkaline Phosphatase: 131 IU/L — ABNORMAL HIGH (ref 44–121)
BUN/Creatinine Ratio: 26 — ABNORMAL HIGH (ref 9–23)
BUN: 16 mg/dL (ref 6–24)
Bilirubin Total: 0.6 mg/dL (ref 0.0–1.2)
CO2: 23 mmol/L (ref 20–29)
Calcium: 9.5 mg/dL (ref 8.7–10.2)
Chloride: 100 mmol/L (ref 96–106)
Creatinine, Ser: 0.61 mg/dL (ref 0.57–1.00)
Globulin, Total: 2.7 g/dL (ref 1.5–4.5)
Glucose: 108 mg/dL — ABNORMAL HIGH (ref 70–99)
Potassium: 3.8 mmol/L (ref 3.5–5.2)
Sodium: 141 mmol/L (ref 134–144)
Total Protein: 7.3 g/dL (ref 6.0–8.5)
eGFR: 105 mL/min/{1.73_m2} (ref >59)

## 2022-06-13 LAB — LIPID PANEL WITH LDL/HDL RATIO
Cholesterol, Total: 141 mg/dL (ref 100–199)
HDL: 53 mg/dL (ref >39)
LDL Chol Calc (NIH): 71 mg/dL (ref 0–99)
LDL/HDL Ratio: 1.3 ratio (ref 0.0–3.2)
Triglycerides: 93 mg/dL (ref 0–149)
VLDL Cholesterol Cal: 17 mg/dL (ref 5–40)

## 2022-06-13 LAB — TSH+T4F+T3FREE
Free T4: 1.24 ng/dL (ref 0.82–1.77)
T3, Free: 3 pg/mL (ref 2.0–4.4)
TSH: 2.61 u[IU]/mL (ref 0.450–4.500)

## 2022-06-13 LAB — VITAMIN B12: Vitamin B-12: 1867 pg/mL — ABNORMAL HIGH (ref 232–1245)

## 2022-06-13 LAB — MICROALBUMIN / CREATININE URINE RATIO
Creatinine, Urine: 38.5 mg/dL
Microalb/Creat Ratio: <8 mg/g creat (ref 0–29)
Microalbumin, Urine: <3 ug/mL

## 2022-06-13 LAB — HEMOGLOBIN A1C
Est. average glucose Bld gHb Est-mCnc: 146 mg/dL
Hgb A1c MFr Bld: 6.7 % — ABNORMAL HIGH (ref 4.8–5.6)

## 2022-06-13 LAB — VITAMIN D 25 HYDROXY (VIT D DEFICIENCY, FRACTURES): Vit D, 25-Hydroxy: 64.2 ng/mL (ref 30.0–100.0)

## 2022-06-13 LAB — INSULIN, RANDOM: INSULIN: 18.2 u[IU]/mL (ref 2.6–24.9)

## 2022-06-17 ENCOUNTER — Encounter (INDEPENDENT_AMBULATORY_CARE_PROVIDER_SITE_OTHER): Payer: Self-pay | Admitting: Bariatrics

## 2022-06-17 DIAGNOSIS — R7401 Elevation of levels of liver transaminase levels: Secondary | ICD-10-CM | POA: Insufficient documentation

## 2022-06-17 DIAGNOSIS — E88819 Insulin resistance, unspecified: Secondary | ICD-10-CM | POA: Insufficient documentation

## 2022-06-25 ENCOUNTER — Ambulatory Visit: Payer: BC Managed Care – PPO | Admitting: Bariatrics

## 2022-06-25 VITALS — BP 136/77 | HR 57 | Temp 97.6°F | Ht 65.0 in | Wt 364.0 lb

## 2022-06-25 DIAGNOSIS — Z7985 Long-term (current) use of injectable non-insulin antidiabetic drugs: Secondary | ICD-10-CM

## 2022-06-25 DIAGNOSIS — Z6841 Body Mass Index (BMI) 40.0 and over, adult: Secondary | ICD-10-CM | POA: Insufficient documentation

## 2022-06-25 DIAGNOSIS — E119 Type 2 diabetes mellitus without complications: Secondary | ICD-10-CM | POA: Diagnosis not present

## 2022-06-25 DIAGNOSIS — R7401 Elevation of levels of liver transaminase levels: Secondary | ICD-10-CM | POA: Diagnosis not present

## 2022-06-25 DIAGNOSIS — Z7984 Long term (current) use of oral hypoglycemic drugs: Secondary | ICD-10-CM

## 2022-06-25 MED ORDER — TIRZEPATIDE 2.5 MG/0.5ML ~~LOC~~ SOAJ
2.5000 mg | SUBCUTANEOUS | 0 refills | Status: DC
Start: 2022-06-25 — End: 2022-07-17

## 2022-06-26 ENCOUNTER — Encounter: Payer: Self-pay | Admitting: Bariatrics

## 2022-06-26 NOTE — Progress Notes (Signed)
Chief Complaint:   OBESITY [name] is here to discuss her progress with her obesity treatment plan along with follow-up of her obesity related diagnoses. [name] is on the Category 4 Plan and states she is following her eating plan approximately 50% of the time. [name] states she is doing 0 minutes 0 times per week.  Today's visit was #: 2 Starting weight: 369 lbs Starting date: 06/11/2022 Today's weight: 364 lbs Today's date: 06/25/2022 Total lbs lost to date: 5 Total lbs lost since last in-office visit: 5  Interim History: [name] is down 5 lbs since her first visit. She states that she did well despite celebrations.   Subjective:   1. Type 2 diabetes mellitus without complication, without long-term current use of insulin (HCC) [name] is taking metformin. Her recent A1c ws 6.7 and insulin 18.2. Her fasting blood sugars this morning was 98, previously 100-120.  2. Elevated ALT measurement [name] denies Tylenol use, but ETOH is rare. Her recent ALT was 40, and ALT 45 in the past.   Assessment/Plan:   1. Type 2 diabetes mellitus without complication, without long-term current use of insulin (HCC) [name] will continue metformin; and she agreed to start Mounjaro 2.5 mg once weekly with no refills. GLP-1 information sheet and Eating Out handout were provided.   - tirzepatide [facility]) 2.5 MG/0.5ML Pen; Inject 2.5 mg into the skin once a week.  Dispense: 2 mL; Refill: 0  2. Elevated ALT measurement Discussed with the patient no activity at this time.   3. Morbid obesity (HCC)  4. BMI 60.0-69.9, adult [facility]) [name] is currently in the action stage of change. As such, her goal is to continue with weight loss efforts. She has agreed to the Category 4 Plan.   Meal planning and mindful eating were discussed. Reviewed labs with the patient from 06/11/2022, CMP, lipid, Vit D, B12, A1c, insulin, and thyroid panel. She will keep healthy foods at home.   Exercise goals: No exercise  has been prescribed at this time.  Behavioral modification strategies: increasing lean protein intake, decreasing simple carbohydrates, increasing vegetables, increasing water intake, decreasing eating out, no skipping meals, meal planning and cooking strategies, keeping healthy foods in the home, and planning for success.  [name] has agreed to follow-up with our clinic in 2 weeks. She was informed of the importance of frequent follow-up visits to maximize her success with intensive lifestyle modifications for her multiple health conditions.   Objective:   Blood pressure 136/77, pulse (!) 57, temperature 97.6 F (36.4 C), height 5\' 5"  (1.651 m), weight (!) 364 lb (165.1 kg), SpO2 98 %. Body mass index is 60.57 kg/m.  General: Cooperative, alert, well developed, in no acute distress. HEENT: Conjunctivae and lids unremarkable. Cardiovascular: Regular rhythm.  Lungs: Normal work of breathing. Neurologic: No focal deficits.   Lab Results  Component Value Date   CREATININE 0.61 06/11/2022   BUN 16 06/11/2022   NA 141 06/11/2022   K 3.8 06/11/2022   CL 100 06/11/2022   CO2 23 06/11/2022   Lab Results  Component Value Date   ALT 40 (H) 06/11/2022   AST 21 06/11/2022   ALKPHOS 131 (H) 06/11/2022   BILITOT 0.6 06/11/2022   Lab Results  Component Value Date   HGBA1C 6.7 (H) 06/11/2022   HGBA1C 6.7 (H) 08/22/2021   HGBA1C 6.1 04/07/2020   HGBA1C 5.7 (H) 12/16/2018   HGBA1C 5.4 08/10/2018   Lab Results  Component Value Date   INSULIN 18.2 06/11/2022  INSULIN 8.9 02/12/2018   INSULIN 9.1 10/20/2017   Lab Results  Component Value Date   TSH 2.610 06/11/2022   Lab Results  Component Value Date   CHOL 141 06/11/2022   HDL 53 06/11/2022   LDLCALC 71 06/11/2022   TRIG 93 06/11/2022   CHOLHDL 2 08/22/2021   Lab Results  Component Value Date   VD25OH 64.2 06/11/2022   VD25OH 53.54 08/22/2021   VD25OH 62.07 04/07/2020   Lab Results  Component Value Date   WBC 6.5  08/22/2021   HGB 13.0 08/22/2021   HCT 39.3 08/22/2021   MCV 90.1 08/22/2021   PLT 256.0 08/22/2021   No results found for: "IRON", "TIBC", "FERRITIN"  Attestation Statements:   Reviewed by clinician on day of visit: allergies, medications, problem list, medical history, surgical history, family history, social history, and previous encounter notes.   Trude Mcburney, am acting as Energy manager for [employer], DO.  I have reviewed the above documentation for accuracy and completeness, and I agree with the above. Corinna Capra, DO

## 2022-07-01 ENCOUNTER — Encounter: Payer: Self-pay | Admitting: Podiatry

## 2022-07-01 ENCOUNTER — Ambulatory Visit: Payer: BC Managed Care – PPO | Admitting: Podiatry

## 2022-07-01 DIAGNOSIS — B351 Tinea unguium: Secondary | ICD-10-CM

## 2022-07-01 DIAGNOSIS — S90121A Contusion of right lesser toe(s) without damage to nail, initial encounter: Secondary | ICD-10-CM

## 2022-07-01 NOTE — Progress Notes (Signed)
Subjective:   Patient ID: [name], female   DOB: 57 y.o.   MRN: [id number]   HPI Patient presents with discolored big toenail right that she states been growing out and secondary problem of traumatized right fifth toe with swelling of the digit and pain mostly at night.  Patient does not smoke likes to be active and does have obesity is complicating factor   Review of Systems  All other systems reviewed and are negative.       Objective:  Physical Exam Vitals and nursing note reviewed.  Constitutional:      Appearance: She is well-developed.  Pulmonary:     Effort: Pulmonary effort is normal.  Musculoskeletal:        General: Normal range of motion.  Skin:    General: Skin is warm.  Neurological:     Mental Status: She is alert.     Neurovascular status intact muscle strength found to be adequate range of motion within normal limits with patient found to have discoloration of the distal one third of the nailbed right dark discoloration across the entire top of the nail stating that it is grown out some over the last 4 months and swelling of the fifth digit.  Good digital perfusion well-oriented x 3     Assessment:  Damaged right hallux nail of the distal one third which is growing out and most likely is trauma versus fungus along with a traumatized right fifth digit with low probability for fracture     Plan:  H&P revealed and reviewed both conditions.  I do think we should allow the nailbed right to grow out and could do a culture or pathology at 1 point in future but appears to be unnecessary currently and I am relatively hopeful it will grow out normally.  The digit I recommended wider shoes pulling out the sheets at night and that it probably will take 8 to 12 weeks to completely heal and if it does swell further to cause pathology we will get an x-ray but seems unnecessary currently.  All questions answered

## 2022-07-17 ENCOUNTER — Ambulatory Visit: Payer: BC Managed Care – PPO | Admitting: Bariatrics

## 2022-07-17 ENCOUNTER — Encounter: Payer: Self-pay | Admitting: Bariatrics

## 2022-07-17 VITALS — BP 146/76 | HR 72 | Temp 98.5°F | Ht 65.0 in | Wt 358.0 lb

## 2022-07-17 DIAGNOSIS — E1169 Type 2 diabetes mellitus with other specified complication: Secondary | ICD-10-CM | POA: Diagnosis not present

## 2022-07-17 DIAGNOSIS — Z7985 Long-term (current) use of injectable non-insulin antidiabetic drugs: Secondary | ICD-10-CM

## 2022-07-17 DIAGNOSIS — E119 Type 2 diabetes mellitus without complications: Secondary | ICD-10-CM

## 2022-07-17 DIAGNOSIS — Z7984 Long term (current) use of oral hypoglycemic drugs: Secondary | ICD-10-CM

## 2022-07-17 DIAGNOSIS — E785 Hyperlipidemia, unspecified: Secondary | ICD-10-CM

## 2022-07-17 DIAGNOSIS — Z6841 Body Mass Index (BMI) 40.0 and over, adult: Secondary | ICD-10-CM

## 2022-07-17 MED ORDER — TIRZEPATIDE 5 MG/0.5ML ~~LOC~~ SOAJ
5.0000 mg | SUBCUTANEOUS | 0 refills | Status: DC
Start: 2022-07-17 — End: 2022-08-08

## 2022-07-22 NOTE — Progress Notes (Unsigned)
Chief Complaint:   OBESITY [name] is here to discuss her progress with her obesity treatment plan along with follow-up of her obesity related diagnoses. [name] is on the Category 4 Plan and states she is following her eating plan approximately 95% of the time. [name] states she is doing 0 minutes 0 times per week.  Today's visit was #: 3 Starting weight: 369 lbs Starting date: 06/11/2022 Today's weight: 358 lbs Today's date: 07/17/2022 Total lbs lost to date: 11 Total lbs lost since last in-office visit: 6  Interim History: Patient is down 6 pounds since her last visit.  Subjective:   1. Type 2 diabetes mellitus without complication, without long-term current use of insulin (HCC) Patient is taking metformin and Mounjaro with no side effects noted.  2. Hyperlipidemia associated with type 2 diabetes mellitus (HCC) Patient is taking Lipitor.  Assessment/Plan:   1. Type 2 diabetes mellitus without complication, without long-term current use of insulin (HCC) Patient agreed to increase Mounjaro from 2.5 mg to 5 mg once weekly, and we will refill for 1 month.  - tirzepatide [facility]) 5 MG/0.5ML Pen; Inject 5 mg into the skin once a week.  Dispense: 2 mL; Refill: 0  2. Hyperlipidemia associated with type 2 diabetes mellitus [facility]) Patient will continue her medication as directed.  3. Morbid obesity (HCC)  4. BMI 50.0-59.9, adult [facility]) [name] is currently in the action stage of change. As such, her goal is to continue with weight loss efforts. She has agreed to the Category 4 Plan.   Meal planning was discussed.   Exercise goals: No exercise has been prescribed at this time.  Behavioral modification strategies: increasing lean protein intake, decreasing simple carbohydrates, increasing vegetables, increasing water intake, decreasing eating out, no skipping meals, meal planning and cooking strategies, and keeping healthy foods in the home.  [name] has agreed to follow-up  with our clinic in 3 weeks. She was informed of the importance of frequent follow-up visits to maximize her success with intensive lifestyle modifications for her multiple health conditions.   Objective:   Blood pressure (!) 146/76, pulse 72, temperature 98.5 F (36.9 C), height 5\' 5"  (1.651 m), weight (!) 358 lb (162.4 kg), SpO2 97 %. Body mass index is 59.57 kg/m.  General: Cooperative, alert, well developed, in no acute distress. HEENT: Conjunctivae and lids unremarkable. Cardiovascular: Regular rhythm.  Lungs: Normal work of breathing. Neurologic: No focal deficits.   Lab Results  Component Value Date   CREATININE 0.61 06/11/2022   BUN 16 06/11/2022   NA 141 06/11/2022   K 3.8 06/11/2022   CL 100 06/11/2022   CO2 23 06/11/2022   Lab Results  Component Value Date   ALT 40 (H) 06/11/2022   AST 21 06/11/2022   ALKPHOS 131 (H) 06/11/2022   BILITOT 0.6 06/11/2022   Lab Results  Component Value Date   HGBA1C 6.7 (H) 06/11/2022   HGBA1C 6.7 (H) 08/22/2021   HGBA1C 6.1 04/07/2020   HGBA1C 5.7 (H) 12/16/2018   HGBA1C 5.4 08/10/2018   Lab Results  Component Value Date   INSULIN 18.2 06/11/2022   INSULIN 8.9 02/12/2018   INSULIN 9.1 10/20/2017   Lab Results  Component Value Date   TSH 2.610 06/11/2022   Lab Results  Component Value Date   CHOL 141 06/11/2022   HDL 53 06/11/2022   LDLCALC 71 06/11/2022   TRIG 93 06/11/2022   CHOLHDL 2 08/22/2021   Lab Results  Component Value Date   VD25OH  64.2 06/11/2022   VD25OH 53.54 08/22/2021   VD25OH 62.07 04/07/2020   Lab Results  Component Value Date   WBC 6.5 08/22/2021   HGB 13.0 08/22/2021   HCT 39.3 08/22/2021   MCV 90.1 08/22/2021   PLT 256.0 08/22/2021   No results found for: "IRON", "TIBC", "FERRITIN"  Attestation Statements:   Reviewed by clinician on day of visit: allergies, medications, problem list, medical history, surgical history, family history, social history, and previous encounter  notes.   Trude Mcburney, am acting as Energy manager for [employer], DO.  I have reviewed the above documentation for accuracy and completeness, and I agree with the above. Corinna Capra, DO

## 2022-07-24 ENCOUNTER — Other Ambulatory Visit: Payer: Self-pay | Admitting: Bariatrics

## 2022-07-25 ENCOUNTER — Encounter: Payer: Self-pay | Admitting: Bariatrics

## 2022-07-31 ENCOUNTER — Other Ambulatory Visit: Payer: Self-pay | Admitting: Family Medicine

## 2022-08-08 ENCOUNTER — Ambulatory Visit: Payer: BC Managed Care – PPO | Admitting: Bariatrics

## 2022-08-08 ENCOUNTER — Encounter: Payer: Self-pay | Admitting: Bariatrics

## 2022-08-08 VITALS — BP 141/87 | HR 66 | Temp 98.0°F | Ht 65.0 in | Wt 346.0 lb

## 2022-08-08 DIAGNOSIS — Z6841 Body Mass Index (BMI) 40.0 and over, adult: Secondary | ICD-10-CM

## 2022-08-08 DIAGNOSIS — E119 Type 2 diabetes mellitus without complications: Secondary | ICD-10-CM

## 2022-08-08 DIAGNOSIS — E1169 Type 2 diabetes mellitus with other specified complication: Secondary | ICD-10-CM | POA: Diagnosis not present

## 2022-08-08 DIAGNOSIS — Z7985 Long-term (current) use of injectable non-insulin antidiabetic drugs: Secondary | ICD-10-CM

## 2022-08-08 DIAGNOSIS — E785 Hyperlipidemia, unspecified: Secondary | ICD-10-CM | POA: Diagnosis not present

## 2022-08-08 MED ORDER — TIRZEPATIDE 5 MG/0.5ML ~~LOC~~ SOAJ: 5.0000 mg | SUBCUTANEOUS | 0 refills | Status: DC

## 2022-08-08 NOTE — Progress Notes (Signed)
WEIGHT SUMMARY AND BIOMETRICS  Weight Lost Since Last Visit: 12lb   Vitals Temp: 98 F (36.7 C) BP: (!) 141/87 Pulse Rate: 66 SpO2: 100 %   Anthropometric Measurements Height: 5\' 5"  (1.651 m) Weight: (!) 346 lb (156.9 kg) BMI (Calculated): 57.58 Weight at Last Visit: 358lb Weight Lost Since Last Visit: 12lb Starting Weight: 369lb Total Weight Loss (lbs): 23 lb (10.4 kg)   Body Composition  Body Fat %: 59.7 % Fat Mass (lbs): 206.8 lbs Muscle Mass (lbs): 132.6 lbs Visceral Fat Rating : 26   Other Clinical Data Fasting: no Labs: no Today's Visit #: 4 Starting Date: 06/11/22    OBESITY [name] is here to discuss her progress with her obesity treatment plan along with follow-up of her obesity related diagnoses.     Nutrition Plan: the Category 4 plan - 90-92% adherence.  Current exercise: walking  Interim History:  He is down 12 lbs since her last visit.  Is not skipping meals, Meeting protein goals., Water intake is adequate., and Denies polyphagia  Pharmacotherapy: [name] is on Mounjaro 5.0 mg SQ weekly, Metformin 1,000 mg Bid.  Adverse side effects: None Hunger is moderately controlled.  Cravings are moderately controlled.  Assessment/Plan:   1. Type 2 diabetes mellitus without complication, without long-term current use of insulin (HCC)  HgbA1c is at goal. Last A1c was 6.7 CBGs: Not checking      Episodes of hypoglycemia: no Medication(s): Mounjaro 5.0 mg SQ weekly  Lab Results  Component Value Date   HGBA1C 6.7 (H) 06/11/2022   HGBA1C 6.7 (H) 08/22/2021   HGBA1C 6.1 04/07/2020   Lab Results  Component Value Date   MICROALBUR <0.7 04/12/2016   LDLCALC 71 06/11/2022   CREATININE 0.61 06/11/2022   Lab Results  Component Value Date   GFR 100.23 08/22/2021   GFR 100.80 04/07/2020   GFR 100.09 04/10/2017    Plan: Continue and refill Mounjaro 5.0 mg SQ weekly Continue all other medications.  Will keep all carbohydrates low  both sweets and starches.  Will continue exercise regimen to 30 to 60 minutes on most days of the week.  Aim for 7 to 9 hours of sleep nightly.  Eat more low glycemic index foods.   Hyperlipidemia LDL is at goal. Medication(s): Lipitor Cardiovascular risk factors: diabetes mellitus, dyslipidemia, obesity (BMI >= 30 kg/m2), and sedentary lifestyle  Lab Results  Component Value Date   CHOL 141 06/11/2022   HDL 53 06/11/2022   LDLCALC 71 06/11/2022   TRIG 93 06/11/2022   CHOLHDL 2 08/22/2021   Lab Results  Component Value Date   ALT 40 (H) 06/11/2022   AST 21 06/11/2022   ALKPHOS 131 (H) 06/11/2022   BILITOT 0.6 06/11/2022   The 10-year ASCVD risk score (Arnett DK, et al., 2019) is: 5%   Values used to calculate the score:     Age: 57 years     Sex: Female     Is Non-Hispanic African American: No     Diabetic: Yes     Tobacco smoker: No     Systolic Blood Pressure: 141 mmHg     Is BP treated: Yes     HDL Cholesterol: 53 mg/dL     Total Cholesterol: 141 mg/dL  Plan:  Continue statin.  Information sheet on healthy vs unhealthy fats.  Will avoid all trans fats.  Will read labels Will minimize saturated fats except the following: low fat meats in moderation, diary, and limited dark chocolate.  Increase Omega  3 in foods, and consider an Omega 3 supplement.     Morbid Obesity: Current BMI BMI (Calculated): 57.58   Pharmacotherapy Plan Continue and refill  Mounjaro 5.0 mg SQ weekly  [name] is currently in the action stage of change. As such, her goal is to continue with weight loss efforts.  She has agreed to the Category 4 plan.  Exercise goals: All adults should avoid inactivity. Some physical activity is better than none, and adults who participate in any amount of physical activity gain some health benefits.  Behavioral modification strategies: increasing lean protein intake, increase water intake, and mindful eating.  [name] has agreed to follow-up with our  clinic in 3 weeks.   No orders of the defined types were placed in this encounter.   There are no discontinued medications.   No orders of the defined types were placed in this encounter.     Objective:   VITALS: Per patient if applicable, see vitals. GENERAL: Alert and in no acute distress. CARDIOPULMONARY: No increased WOB. Speaking in clear sentences.  PSYCH: Pleasant and cooperative. Speech normal rate and rhythm. Affect is appropriate. Insight and judgement are appropriate. Attention is focused, linear, and appropriate.  NEURO: Oriented as arrived to appointment on time with no prompting.   Attestation Statements:   This was prepared with the assistance of Engineer, civil (consulting).  Occasional wrong-word or sound-a-like substitutions may have occurred due to the inherent limitations of voice recognition software.   Corinna Capra, DO

## 2022-08-26 ENCOUNTER — Encounter: Payer: BC Managed Care – PPO | Admitting: Family Medicine

## 2022-08-28 ENCOUNTER — Other Ambulatory Visit: Payer: Self-pay | Admitting: Family Medicine

## 2022-08-28 DIAGNOSIS — I10 Essential (primary) hypertension: Secondary | ICD-10-CM

## 2022-09-08 ENCOUNTER — Other Ambulatory Visit: Payer: Self-pay | Admitting: Bariatrics

## 2022-09-08 DIAGNOSIS — E119 Type 2 diabetes mellitus without complications: Secondary | ICD-10-CM

## 2022-09-09 ENCOUNTER — Encounter: Payer: Self-pay | Admitting: Bariatrics

## 2022-09-09 ENCOUNTER — Ambulatory Visit: Payer: BC Managed Care – PPO | Admitting: Bariatrics

## 2022-09-09 VITALS — BP 141/82 | HR 58 | Temp 97.6°F | Ht 65.0 in | Wt 338.0 lb

## 2022-09-09 DIAGNOSIS — E785 Hyperlipidemia, unspecified: Secondary | ICD-10-CM

## 2022-09-09 DIAGNOSIS — Z6841 Body Mass Index (BMI) 40.0 and over, adult: Secondary | ICD-10-CM

## 2022-09-09 DIAGNOSIS — E119 Type 2 diabetes mellitus without complications: Secondary | ICD-10-CM | POA: Diagnosis not present

## 2022-09-09 DIAGNOSIS — Z7984 Long term (current) use of oral hypoglycemic drugs: Secondary | ICD-10-CM

## 2022-09-09 DIAGNOSIS — E1169 Type 2 diabetes mellitus with other specified complication: Secondary | ICD-10-CM | POA: Diagnosis not present

## 2022-09-09 DIAGNOSIS — Z7985 Long-term (current) use of injectable non-insulin antidiabetic drugs: Secondary | ICD-10-CM

## 2022-09-09 MED ORDER — TIRZEPATIDE 5 MG/0.5ML ~~LOC~~ SOAJ
5.0000 mg | SUBCUTANEOUS | 0 refills | Status: DC
Start: 2022-09-09 — End: 2022-10-10

## 2022-09-10 NOTE — Progress Notes (Unsigned)
Chief Complaint:   OBESITY [name] is here to discuss her progress with her obesity treatment plan along with follow-up of her obesity related diagnoses. [name] is on the Category 4 Plan and states she is following her eating plan approximately 80% of the time. [name] states she is walking for 30 minutes 3-5 times per week.  Today's visit was #: 5 Starting weight: 369 lbs Starting date: 06/11/2022 Today's weight: 338 lbs Today's date: 09/09/2022 Total lbs lost to date: 31 Total lbs lost since last in-office visit: 8  Interim History: Patient is down an additional 8 lbs since her last visit. She had been going out to eat.   Subjective:   1. Type 2 diabetes mellitus without complication, without long-term current use of insulin (HCC) Patient is taking Mounjaro and metformin, with no major side effects.   2. Hyperlipidemia associated with type 2 diabetes mellitus (HCC) Patient is taking Lipitor.   Assessment/Plan:   1. Type 2 diabetes mellitus without complication, without long-term current use of insulin (HCC) Patient will continue her medications, and we will refill Mounjaro 5 mg for 1 month.   - tirzepatide [facility]) 5 MG/0.5ML Pen; Inject 5 mg into the skin once a week.  Dispense: 2 mL; Refill: 0  2. Hyperlipidemia associated with type 2 diabetes mellitus [facility]) Patient will continue Lipitor as directed.   3. Morbid obesity (HCC)  4. BMI 50.0-59.9, adult [facility]) [name] is currently in the action stage of change. As such, her goal is to continue with weight loss efforts. She has agreed to the Category 4 Plan.   Meal planning and intentional eating were discussed.   Exercise goals: As is.   Behavioral modification strategies: increasing lean protein intake, decreasing simple carbohydrates, increasing vegetables, increasing water intake, decreasing eating out, no skipping meals, meal planning and cooking strategies, and keeping healthy foods in the home.  [name] has  agreed to follow-up with our clinic in 3 weeks. She was informed of the importance of frequent follow-up visits to maximize her success with intensive lifestyle modifications for her multiple health conditions.   Objective:   Blood pressure (!) 141/82, pulse (!) 58, temperature 97.6 F (36.4 C), height 5\' 5"  (1.651 m), weight (!) 338 lb (153.3 kg), SpO2 100%. Body mass index is 56.25 kg/m.  General: Cooperative, alert, well developed, in no acute distress. HEENT: Conjunctivae and lids unremarkable. Cardiovascular: Regular rhythm.  Lungs: Normal work of breathing. Neurologic: No focal deficits.   Lab Results  Component Value Date   CREATININE 0.61 06/11/2022   BUN 16 06/11/2022   NA 141 06/11/2022   K 3.8 06/11/2022   CL 100 06/11/2022   CO2 23 06/11/2022   Lab Results  Component Value Date   ALT 40 (H) 06/11/2022   AST 21 06/11/2022   ALKPHOS 131 (H) 06/11/2022   BILITOT 0.6 06/11/2022   Lab Results  Component Value Date   HGBA1C 6.7 (H) 06/11/2022   HGBA1C 6.7 (H) 08/22/2021   HGBA1C 6.1 04/07/2020   HGBA1C 5.7 (H) 12/16/2018   HGBA1C 5.4 08/10/2018   Lab Results  Component Value Date   INSULIN 18.2 06/11/2022   INSULIN 8.9 02/12/2018   INSULIN 9.1 10/20/2017   Lab Results  Component Value Date   TSH 2.610 06/11/2022   Lab Results  Component Value Date   CHOL 141 06/11/2022   HDL 53 06/11/2022   LDLCALC 71 06/11/2022   TRIG 93 06/11/2022   CHOLHDL 2 08/22/2021   Lab Results  Component Value Date   VD25OH 64.2 06/11/2022   VD25OH 53.54 08/22/2021   VD25OH 62.07 04/07/2020   Lab Results  Component Value Date   WBC 6.5 08/22/2021   HGB 13.0 08/22/2021   HCT 39.3 08/22/2021   MCV 90.1 08/22/2021   PLT 256.0 08/22/2021   No results found for: "IRON", "TIBC", "FERRITIN"  Attestation Statements:   Reviewed by clinician on day of visit: allergies, medications, problem list, medical history, surgical history, family history, social history, and  previous encounter notes.   Trude Mcburney, am acting as Energy manager for [employer], DO.  I have reviewed the above documentation for accuracy and completeness, and I agree with the above. Corinna Capra, DO

## 2022-10-01 ENCOUNTER — Other Ambulatory Visit: Payer: Self-pay | Admitting: Family Medicine

## 2022-10-05 ENCOUNTER — Other Ambulatory Visit: Payer: Self-pay | Admitting: Bariatrics

## 2022-10-05 DIAGNOSIS — E119 Type 2 diabetes mellitus without complications: Secondary | ICD-10-CM

## 2022-10-10 ENCOUNTER — Ambulatory Visit: Payer: BC Managed Care – PPO | Admitting: Bariatrics

## 2022-10-10 ENCOUNTER — Encounter: Payer: Self-pay | Admitting: Bariatrics

## 2022-10-10 VITALS — BP 139/70 | HR 70 | Temp 98.0°F | Ht 65.0 in | Wt 332.0 lb

## 2022-10-10 DIAGNOSIS — E119 Type 2 diabetes mellitus without complications: Secondary | ICD-10-CM | POA: Diagnosis not present

## 2022-10-10 DIAGNOSIS — Z7985 Long-term (current) use of injectable non-insulin antidiabetic drugs: Secondary | ICD-10-CM

## 2022-10-10 DIAGNOSIS — E1169 Type 2 diabetes mellitus with other specified complication: Secondary | ICD-10-CM

## 2022-10-10 DIAGNOSIS — Z6841 Body Mass Index (BMI) 40.0 and over, adult: Secondary | ICD-10-CM

## 2022-10-10 MED ORDER — TIRZEPATIDE 7.5 MG/0.5ML ~~LOC~~ SOAJ: 7.5000 mg | SUBCUTANEOUS | 0 refills | Status: DC

## 2022-10-10 NOTE — Progress Notes (Signed)
WEIGHT SUMMARY AND BIOMETRICS  Weight Lost Since Last Visit: 6lb  Vitals Temp: 98 F (36.7 C) BP: 139/70 Pulse Rate: 70 SpO2: 98 %   Anthropometric Measurements Height: 5\' 5"  (1.651 m) Weight: (!) 332 lb (150.6 kg) BMI (Calculated): 55.25 Weight at Last Visit: 338lb Weight Lost Since Last Visit: 6lb Starting Weight: 369lb Total Weight Loss (lbs): 37 lb (16.8 kg)   Body Composition  Body Fat %: 59.9 % Fat Mass (lbs): 199.2 lbs Muscle Mass (lbs): 126.6 lbs Visceral Fat Rating : 25   Other Clinical Data Fasting: no Labs: no Today's Visit #: 6 Starting Date: 06/11/22    OBESITY Chyrl is here to discuss her progress with her obesity treatment plan along with follow-up of her obesity related diagnoses.     Nutrition Plan: the Category 4 plan - 90% adherence.  Current exercise: none  Interim History:  She is down 6 lbs since her last visit.  Eating all of the food on the plan., Protein intake is as prescribed, Is not skipping meals, Water intake is adequate., and Denies polyphagia  Pharmacotherapy: Nilam is on Mounjaro 5.0 mg SQ weekly Adverse side effects: None Hunger is moderately controlled.  Cravings are moderately controlled.  Assessment/Plan:   1. Type 2 diabetes mellitus without complication, without long-term current use of insulin (HCC) Type II Diabetes HgbA1c is at goal. Last A1c was 6.7 CBGs: Not checking      Episodes of hypoglycemia: no Medication(s): Mounjaro 5.0 mg SQ weekly  Lab Results  Component Value Date   HGBA1C 6.7 (H) 06/11/2022   HGBA1C 6.7 (H) 08/22/2021   HGBA1C 6.1 04/07/2020   Lab Results  Component Value Date   MICROALBUR <0.7 04/12/2016   LDLCALC 71 06/11/2022   CREATININE 0.61 06/11/2022   Lab Results  Component Value Date   GFR 100.23 08/22/2021   GFR 100.80 04/07/2020   GFR 100.09 04/10/2017    Plan: Continue and increase dose Mounjaro 7.5 mg SQ weekly Will keep all carbohydrates low both  sweets and starches.  Will continue exercise regimen to 30 to 60 minutes on most days of the week.  Eat more low glycemic index foods.  Will continue to adhere closely to the plan    Morbid Obesity: Current BMI BMI (Calculated): 55.25   Will follow the plan 80 to 90 %.   Pharmacotherapy Plan Continue and refill  Zepbound 7.5 mg SQ weekly  Towanna is currently in the action stage of change. As such, her goal is to continue with weight loss efforts.  She has agreed to the Category 4 plan.  Exercise goals: All adults should avoid inactivity. Some physical activity is better than none, and adults who participate in any amount of physical activity gain some health benefits.  Behavioral modification strategies: increasing lean protein intake, no meal skipping, increase water intake, better snacking choices, planning for success, increasing vegetables, increasing fiber rich foods, keep healthy foods in the home, and mindful eating.  Dwan has agreed to follow-up with our clinic in 4 weeks.      Objective:   VITALS: Per patient if applicable, see vitals. GENERAL: Alert and in no acute distress. CARDIOPULMONARY: No increased WOB. Speaking in clear sentences.  PSYCH: Pleasant and cooperative. Speech normal rate and rhythm. Affect is appropriate. Insight and judgement are appropriate. Attention is focused, linear, and appropriate.  NEURO: Oriented as arrived to appointment on time with no prompting.   Attestation Statements:    This was prepared with the assistance  of Engineer, civil (consulting).  Occasional wrong-word or sound-a-like substitutions may have occurred due to the inherent limitations of voice recognition software.   Corinna Capra, DO

## 2022-10-17 ENCOUNTER — Encounter: Payer: Self-pay | Admitting: Bariatrics

## 2022-10-17 ENCOUNTER — Telehealth: Payer: Self-pay

## 2022-10-17 NOTE — Telephone Encounter (Signed)
Started PA for [facility] 7.5mg  via covermymeds

## 2022-10-23 ENCOUNTER — Encounter (INDEPENDENT_AMBULATORY_CARE_PROVIDER_SITE_OTHER): Payer: Self-pay

## 2022-11-12 ENCOUNTER — Other Ambulatory Visit: Payer: Self-pay | Admitting: Bariatrics

## 2022-11-13 ENCOUNTER — Ambulatory Visit: Payer: BC Managed Care – PPO | Admitting: Bariatrics

## 2022-11-14 ENCOUNTER — Ambulatory Visit: Payer: BC Managed Care – PPO | Admitting: Family Medicine

## 2022-11-14 ENCOUNTER — Encounter: Payer: Self-pay | Admitting: Family Medicine

## 2022-11-14 VITALS — BP 133/81 | HR 60 | Temp 98.1°F | Ht 65.0 in | Wt 322.0 lb

## 2022-11-14 DIAGNOSIS — Z6841 Body Mass Index (BMI) 40.0 and over, adult: Secondary | ICD-10-CM

## 2022-11-14 DIAGNOSIS — E119 Type 2 diabetes mellitus without complications: Secondary | ICD-10-CM

## 2022-11-14 DIAGNOSIS — E785 Hyperlipidemia, unspecified: Secondary | ICD-10-CM

## 2022-11-14 DIAGNOSIS — R7401 Elevation of levels of liver transaminase levels: Secondary | ICD-10-CM | POA: Diagnosis not present

## 2022-11-14 DIAGNOSIS — E1169 Type 2 diabetes mellitus with other specified complication: Secondary | ICD-10-CM | POA: Diagnosis not present

## 2022-11-14 DIAGNOSIS — E66813 Obesity, class 3: Secondary | ICD-10-CM

## 2022-11-14 DIAGNOSIS — E662 Morbid (severe) obesity with alveolar hypoventilation: Secondary | ICD-10-CM

## 2022-11-14 DIAGNOSIS — Z7985 Long-term (current) use of injectable non-insulin antidiabetic drugs: Secondary | ICD-10-CM

## 2022-11-14 MED ORDER — TIRZEPATIDE 7.5 MG/0.5ML ~~LOC~~ SOAJ
7.5000 mg | SUBCUTANEOUS | 1 refills | Status: DC
Start: 2022-11-14 — End: 2022-12-09

## 2022-11-14 MED ORDER — ATORVASTATIN CALCIUM 20 MG PO TABS
20.0000 mg | ORAL_TABLET | Freq: Every day | ORAL | 0 refills | Status: DC
Start: 2022-11-14 — End: 2023-01-28

## 2022-11-14 NOTE — Assessment & Plan Note (Signed)
ALT has been consistently elevated (mildly) likely from NAFLD but this has not been confirmed with imagining. She is actively working on weight reduction and avoids excess ETOH or Tylenol  Repeat LFTs next visit Obtain liver ultrasound next visit if ALT remains elevated

## 2022-11-14 NOTE — Assessment & Plan Note (Signed)
Lab Results  Component Value Date   CHOL 141 06/11/2022   HDL 53 06/11/2022   LDLCALC 71 06/11/2022   TRIG 93 06/11/2022   CHOLHDL 2 08/22/2021   Lipids at goal on Atorvastatin 20 mg daily without adverse SE Needed RF today

## 2022-11-14 NOTE — Progress Notes (Signed)
Office: [phone]  /  Fax: [phone]  WEIGHT SUMMARY AND BIOMETRICS  Starting Date: 06/11/22  Starting Weight: 369lb   Weight Lost Since Last Visit: 10lb   Vitals Temp: 98.1 F (36.7 C) BP: 133/81 Pulse Rate: 60 SpO2: 99 %   Body Composition  Body Fat %: 58.6 % Fat Mass (lbs): 189 lbs Muscle Mass (lbs): 126.6 lbs Visceral Fat Rating : 24   HPI  Chief Complaint: OBESITY  [name] is here to discuss her progress with her obesity treatment plan. She is on the the Category 4 Plan and states she is following her eating plan approximately 90 % of the time. She states she is walking for 20 minutes 3-4 times per week.   Interval History:  Since last office visit she is down 10 lb  This gives her a net weight loss of 47 lb in the past 5 mos of medically supervised weight management This is a 12.7% TBW loss in 5 mos She did go up on Mounjaro to 7.5 mg weekly 4 weeks ago with  Improved satiety Doing a little walking, limited by weather and plantar fasciitis She plans to increase walking time in the next month She has celebrated several birthdays but and did eat off plan but was mindful of choices and portion sizes  Pharmacotherapy: Mounjaro 7.5 mg weekly  PHYSICAL EXAM:  Blood pressure 133/81, pulse 60, temperature 98.1 F (36.7 C), height 5\' 5"  (1.651 m), weight (!) 322 lb (146.1 kg), SpO2 99%. Body mass index is 53.58 kg/m.  General: She is overweight, cooperative, alert, well developed, and in no acute distress. PSYCH: Has normal mood, affect and thought process.   Lungs: Normal breathing effort, no conversational dyspnea.   ASSESSMENT AND PLAN  TREATMENT PLAN FOR OBESITY:  Recommended Dietary Goals  [name] is currently in the action stage of change. As such, her goal is to continue weight management plan. She has agreed to the Category 4 Plan.  Behavioral Intervention  We discussed the following Behavioral Modification Strategies today: increasing  lean protein intake, decreasing simple carbohydrates , increasing vegetables, increasing lower glycemic fruits, avoiding skipping meals, increasing water intake, keeping healthy foods at home, work on managing stress, creating time for self-care and relaxation measures, continue to practice mindfulness when eating, and planning for success.  Additional resources provided today: NA  Recommended Physical Activity Goals  [name] has been advised to work up to 150 minutes of moderate intensity aerobic activity a week and strengthening exercises 2-3 times per week for cardiovascular health, weight loss maintenance and preservation of muscle mass.   She has agreed to Start aerobic activity with a goal of 150 minutes a week at moderate intensity.  - increase walking time to 20+ min 5 days/ wk  Pharmacotherapy changes for the treatment of obesity: none  ASSOCIATED CONDITIONS ADDRESSED TODAY  Type 2 diabetes mellitus without complication, without long-term current use of insulin (HCC) Assessment & Plan: Lab Results  Component Value Date   HGBA1C 6.7 (H) 06/11/2022   Doing well on Mounjaro 7.5 mg weekly + a reduced kcal low sugar diet, rich in lean protein. She has a 12.7% TBW loss in 5 mos of medically supervised weight management Feels adequate satiety on Mounjaro and is maintaining her lean body mass on bioimpedence  Recheck A1c, CMP, fasting insulin next visit Continue Mounjaro at 7.5 mg weekly Increase walking time  Orders: -     Tirzepatide; Inject 7.5 mg into the skin once a week.  Dispense:  2 mL; Refill: 1  Hyperlipidemia associated with type 2 diabetes mellitus [facility]) Assessment & Plan: Lab Results  Component Value Date   CHOL 141 06/11/2022   HDL 53 06/11/2022   LDLCALC 71 06/11/2022   TRIG 93 06/11/2022   CHOLHDL 2 08/22/2021   Lipids at goal on Atorvastatin 20 mg daily without adverse SE Needed RF today   Orders: -     Atorvastatin Calcium; Take 1 tablet (20 mg total)  by mouth daily.  Dispense: 90 tablet; Refill: 0  Class 3 obesity with alveolar hypoventilation, serious comorbidity, and body mass index (BMI) of 50.0 to 59.9 in adult [facility])  Elevated ALT measurement Assessment & Plan: ALT has been consistently elevated (mildly) likely from NAFLD but this has not been confirmed with imagining. She is actively working on weight reduction and avoids excess ETOH or Tylenol  Repeat LFTs next visit Obtain liver ultrasound next visit if ALT remains elevated        She was informed of the importance of frequent follow up visits to maximize her success with intensive lifestyle modifications for her multiple health conditions.   ATTESTASTION STATEMENTS:  Reviewed by clinician on day of visit: allergies, medications, problem list, medical history, surgical history, family history, social history, and previous encounter notes pertinent to obesity diagnosis.   I have personally spent 30 minutes total time today in preparation, patient care, nutritional counseling and documentation for this visit, including the following: review of clinical lab tests; review of medical tests/procedures/services.      Glennis Brink, DO DABFM, DABOM [facility] [address] [place], [place] [address] [phone]

## 2022-11-14 NOTE — Assessment & Plan Note (Signed)
Lab Results  Component Value Date   HGBA1C 6.7 (H) 06/11/2022   Doing well on Mounjaro 7.5 mg weekly + a reduced kcal low sugar diet, rich in lean protein. She has a 12.7% TBW loss in 5 mos of medically supervised weight management Feels adequate satiety on Mounjaro and is maintaining her lean body mass on bioimpedence  Recheck A1c, CMP, fasting insulin next visit Continue Mounjaro at 7.5 mg weekly Increase walking time

## 2022-12-09 ENCOUNTER — Ambulatory Visit: Payer: BC Managed Care – PPO | Admitting: Bariatrics

## 2022-12-09 ENCOUNTER — Encounter: Payer: Self-pay | Admitting: Bariatrics

## 2022-12-09 VITALS — BP 143/82 | HR 65 | Temp 97.8°F | Ht 65.0 in | Wt 315.0 lb

## 2022-12-09 DIAGNOSIS — E1169 Type 2 diabetes mellitus with other specified complication: Secondary | ICD-10-CM

## 2022-12-09 DIAGNOSIS — E119 Type 2 diabetes mellitus without complications: Secondary | ICD-10-CM | POA: Diagnosis not present

## 2022-12-09 DIAGNOSIS — I10 Essential (primary) hypertension: Secondary | ICD-10-CM

## 2022-12-09 DIAGNOSIS — R5383 Other fatigue: Secondary | ICD-10-CM

## 2022-12-09 DIAGNOSIS — E66813 Obesity, class 3: Secondary | ICD-10-CM

## 2022-12-09 DIAGNOSIS — E785 Hyperlipidemia, unspecified: Secondary | ICD-10-CM

## 2022-12-09 DIAGNOSIS — Z7985 Long-term (current) use of injectable non-insulin antidiabetic drugs: Secondary | ICD-10-CM

## 2022-12-09 DIAGNOSIS — E559 Vitamin D deficiency, unspecified: Secondary | ICD-10-CM

## 2022-12-09 DIAGNOSIS — E538 Deficiency of other specified B group vitamins: Secondary | ICD-10-CM

## 2022-12-09 DIAGNOSIS — Z6841 Body Mass Index (BMI) 40.0 and over, adult: Secondary | ICD-10-CM

## 2022-12-09 MED ORDER — TIRZEPATIDE 7.5 MG/0.5ML ~~LOC~~ SOAJ
7.5000 mg | SUBCUTANEOUS | 1 refills | Status: DC
Start: 2022-12-09 — End: 2023-01-08

## 2022-12-09 MED ORDER — LOSARTAN POTASSIUM 100 MG PO TABS
100.0000 mg | ORAL_TABLET | Freq: Every day | ORAL | 0 refills | Status: DC
Start: 2022-12-09 — End: 2023-01-28

## 2022-12-09 MED ORDER — METOPROLOL SUCCINATE ER 50 MG PO TB24: ORAL_TABLET | ORAL | 0 refills | Status: DC

## 2022-12-09 NOTE — Progress Notes (Signed)
WEIGHT SUMMARY AND BIOMETRICS  Weight Lost Since Last Visit: 7lb  Weight Gained Since Last Visit: 0   Vitals Temp: 97.8 F (36.6 C) BP: (!) 143/82 Pulse Rate: 65 SpO2: 97 %   Anthropometric Measurements Height: 5\' 5"  (1.651 m) Weight: (!) 315 lb (142.9 kg) BMI (Calculated): 52.42 Weight at Last Visit: 322lb Weight Lost Since Last Visit: 7lb Weight Gained Since Last Visit: 0 Starting Weight: 369lb Total Weight Loss (lbs): 54 lb (24.5 kg)   Body Composition  Body Fat %: 57.6 % Fat Mass (lbs): 181.6 lbs Muscle Mass (lbs): 127 lbs Visceral Fat Rating : 23   Other Clinical Data Fasting: yes Labs: no Today's Visit #: 8 Starting Date: 06/11/22    OBESITY [name] is here to discuss her progress with her obesity treatment plan along with follow-up of her obesity related diagnoses.     Nutrition Plan: the Category 4 plan - 90% adherence.  Current exercise: walking  Interim History:  She is down another 7 lbs since her last visit.  Eating all of the food on the plan., Protein intake is as prescribed, Is not skipping meals, and Water intake is adequate.  Pharmacotherapy: Feliza is on Mounjaro 7.5 mg SQ weekly Adverse side effects: None Hunger is well controlled.  Cravings are moderately controlled.  Assessment/Plan:   1. Type 2 diabetes mellitus without complication, without long-term current use of insulin (HCC) Type II Diabetes HgbA1c is not at goal. Last A1c was 6.7 CBGs: Not checking      Episodes of hypoglycemia: no Medication(s): Mounjaro 7.5 mg SQ weekly  Lab Results  Component Value Date   HGBA1C 6.7 (H) 06/11/2022   HGBA1C 6.7 (H) 08/22/2021   HGBA1C 6.1 04/07/2020   Lab Results  Component Value Date   MICROALBUR <0.7 04/12/2016   LDLCALC 71 06/11/2022   CREATININE 0.61 06/11/2022   Lab Results  Component Value Date   GFR 100.23 08/22/2021   GFR 100.80 04/07/2020   GFR 100.09 04/10/2017    Plan: Continue and refill  Mounjaro 7.5 mg SQ weekly Continue all other medications.  Will keep all carbohydrates low both sweets and starches.  Will continue exercise regimen to 30 to 60 minutes on most days of the week.  Aim for 7 to 9 hours of sleep nightly.  Eat more low glycemic index foods.   Hyperlipidemia LDL is at goal. Medication(s): Atorvastatin No myalgias Cardiovascular risk factors: diabetes mellitus, dyslipidemia, obesity (BMI >= 30 kg/m2), and sedentary lifestyle  Lab Results  Component Value Date   CHOL 141 06/11/2022   HDL 53 06/11/2022   LDLCALC 71 06/11/2022   TRIG 93 06/11/2022   CHOLHDL 2 08/22/2021   Lab Results  Component Value Date   ALT 40 (H) 06/11/2022   AST 21 06/11/2022   ALKPHOS 131 (H) 06/11/2022   BILITOT 0.6 06/11/2022   The 10-year ASCVD risk score (Arnett DK, et al., 2019) is: 5.8%   Values used to calculate the score:     Age: 57 years     Sex: Female     Is Non-Hispanic African American: No     Diabetic: Yes     Tobacco smoker: No     Systolic Blood Pressure: 143 mmHg     Is BP treated: Yes     HDL Cholesterol: 53 mg/dL     Total Cholesterol: 141 mg/dL  Plan:  Continue statin.  Information sheet on healthy vs unhealthy fats.  Will avoid all trans fats.  Will  read labels Will minimize saturated fats except the following: low fat meats in moderation, diary, and limited dark chocolate.  Increase Omega 3 in foods, and consider an Omega 3 supplement.    Hypertension:   Taking medications as directed. Controlled. Slightly elevated today, but rushed in and in pain.   Plan: Continue medications as directed. No added salt.   Vitamin D deficiency:   Taking vitamin D prescription/OTC  Plan: Continue vitamin D. Will check vitamin D.     B 12 deficiency:   She is not taking B12 (oral/sublingual). She has a history of B12 deficiency and had the following s/s in the past.   Plan:  Check B 12 lab today.   Other fatigue:  Some intermittent fatigue and  some leg cramps.   Plan: check ferritin and well as B 12 and vitamin D.   Labs done today (CMP, Lipids, HgbA1c, insulin, vitamin D, B 12, and ferritin, and urine microalbumin ).     Morbid Obesity: Current BMI BMI (Calculated): 52.42   Pharmacotherapy Plan Continue and refill  Zepbound 7.5 mg SQ weekly  Shaleena is currently in the action stage of change. As such, her goal is to continue with weight loss efforts.  She has agreed to the Category 4 plan.  Exercise goals: All adults should avoid inactivity. Some physical activity is better than none, and adults who participate in any amount of physical activity gain some health benefits.  Behavioral modification strategies: increasing lean protein intake, decreasing simple carbohydrates , no meal skipping, meal planning , increase water intake, better snacking choices, and planning for success.  Thailyn has agreed to follow-up with our clinic in 3 weeks.      Objective:   VITALS: Per patient if applicable, see vitals. GENERAL: Alert and in no acute distress. CARDIOPULMONARY: No increased WOB. Speaking in clear sentences.  PSYCH: Pleasant and cooperative. Speech normal rate and rhythm. Affect is appropriate. Insight and judgement are appropriate. Attention is focused, linear, and appropriate.  NEURO: Oriented as arrived to appointment on time with no prompting.   Attestation Statements:    This was prepared with the assistance of Engineer, civil (consulting).  Occasional wrong-word or sound-a-like substitutions may have occurred due to the inherent limitations of voice recognition software.   Corinna Capra, DO

## 2022-12-10 LAB — COMPREHENSIVE METABOLIC PANEL
ALT: 35 [IU]/L — ABNORMAL HIGH (ref 0–32)
AST: 24 [IU]/L (ref 0–40)
Albumin: 4.5 g/dL (ref 3.8–4.9)
Alkaline Phosphatase: 128 [IU]/L — ABNORMAL HIGH (ref 44–121)
BUN/Creatinine Ratio: 22 (ref 9–23)
BUN: 15 mg/dL (ref 6–24)
Bilirubin Total: 0.5 mg/dL (ref 0.0–1.2)
CO2: 22 mmol/L (ref 20–29)
Calcium: 9.7 mg/dL (ref 8.7–10.2)
Chloride: 102 mmol/L (ref 96–106)
Creatinine, Ser: 0.69 mg/dL (ref 0.57–1.00)
Globulin, Total: 3.1 g/dL (ref 1.5–4.5)
Glucose: 91 mg/dL (ref 70–99)
Potassium: 4 mmol/L (ref 3.5–5.2)
Sodium: 142 mmol/L (ref 134–144)
Total Protein: 7.6 g/dL (ref 6.0–8.5)
eGFR: 101 mL/min/{1.73_m2} (ref >59)

## 2022-12-10 LAB — INSULIN, RANDOM: INSULIN: 11.2 u[IU]/mL (ref 2.6–24.9)

## 2022-12-10 LAB — LIPID PANEL WITH LDL/HDL RATIO
Cholesterol, Total: 134 mg/dL (ref 100–199)
HDL: 56 mg/dL (ref >39)
LDL Chol Calc (NIH): 62 mg/dL (ref 0–99)
LDL/HDL Ratio: 1.1 ratio (ref 0.0–3.2)
Triglycerides: 82 mg/dL (ref 0–149)
VLDL Cholesterol Cal: 16 mg/dL (ref 5–40)

## 2022-12-10 LAB — HEMOGLOBIN A1C
Est. average glucose Bld gHb Est-mCnc: 117 mg/dL
Hgb A1c MFr Bld: 5.7 % — ABNORMAL HIGH (ref 4.8–5.6)

## 2022-12-10 LAB — FERRITIN: Ferritin: 204 ng/mL — ABNORMAL HIGH (ref 15–150)

## 2022-12-10 LAB — MICROALBUMIN / CREATININE URINE RATIO
Creatinine, Urine: 9 mg/dL
Microalbumin, Urine: <3 ug/mL

## 2022-12-10 LAB — VITAMIN D 25 HYDROXY (VIT D DEFICIENCY, FRACTURES): Vit D, 25-Hydroxy: 71.2 ng/mL (ref 30.0–100.0)

## 2022-12-10 LAB — VITAMIN B12: Vitamin B-12: 844 pg/mL (ref 232–1245)

## 2022-12-12 ENCOUNTER — Telehealth: Payer: Self-pay

## 2022-12-12 NOTE — Telephone Encounter (Signed)
Notified patient of Dr. Irving Burton recommendations and notified patient that she can redo her urine sample at her next visit with Irene Limbo. Patient verbalized understanding.

## 2022-12-12 NOTE — Telephone Encounter (Signed)
-----   Message from [name] sent at 12/11/2022  3:31 PM EDT ----- Call pt. Her urine sample for protein was too dilute. We will recheck at her next visit. She should drink some water that morning but not very much.

## 2023-01-08 ENCOUNTER — Encounter: Payer: Self-pay | Admitting: Nurse Practitioner

## 2023-01-08 ENCOUNTER — Ambulatory Visit: Payer: BC Managed Care – PPO | Admitting: Nurse Practitioner

## 2023-01-08 VITALS — BP 138/73 | HR 67 | Temp 98.0°F | Ht 65.0 in | Wt 313.0 lb

## 2023-01-08 DIAGNOSIS — K5903 Drug induced constipation: Secondary | ICD-10-CM | POA: Diagnosis not present

## 2023-01-08 DIAGNOSIS — Z6841 Body Mass Index (BMI) 40.0 and over, adult: Secondary | ICD-10-CM | POA: Diagnosis not present

## 2023-01-08 DIAGNOSIS — E119 Type 2 diabetes mellitus without complications: Secondary | ICD-10-CM | POA: Diagnosis not present

## 2023-01-08 DIAGNOSIS — Z7985 Long-term (current) use of injectable non-insulin antidiabetic drugs: Secondary | ICD-10-CM

## 2023-01-08 MED ORDER — TIRZEPATIDE 7.5 MG/0.5ML ~~LOC~~ SOAJ: 7.5000 mg | SUBCUTANEOUS | 1 refills | Status: DC

## 2023-01-08 NOTE — Progress Notes (Signed)
Office: [phone]  /  Fax: [phone]  WEIGHT SUMMARY AND BIOMETRICS  Weight Lost Since Last Visit: 2lb  Weight Gained Since Last Visit: 0lb   Vitals Temp: 98 F (36.7 C) BP: 138/73 Pulse Rate: 67 SpO2: 98 %   Anthropometric Measurements Height: 5\' 5"  (1.651 m) Weight: (!) 313 lb (142 kg) BMI (Calculated): 52.09 Weight at Last Visit: 315lb Weight Lost Since Last Visit: 2lb Weight Gained Since Last Visit: 0lb Starting Weight: 369lb Total Weight Loss (lbs): 56 lb (25.4 kg)   Body Composition  Body Fat %: 57 % Fat Mass (lbs): 178.6 lbs Muscle Mass (lbs): 127.8 lbs Visceral Fat Rating : 22   Other Clinical Data Fasting: Yes Labs: No Today's Visit #: 9 Starting Date: 06/11/22     HPI  Chief Complaint: OBESITY  [name] is here to discuss her progress with her obesity treatment plan. She is on the the Category 4 Plan and states she is following her eating plan approximately 85 % of the time. She states she is exercising 30 minutes 4 days per week.   Interval History:  Since last office visit she has lost 2 pounds. She is not skipping meals and is eating a protein and vegetables with every meal.  She is consistent with breakfast and lunch.  If she gets off track it's after school and dinner-if she goes out to eat.  She is drinking coffee, diet soda, sugar free Twist and water.  She is struggling with some right hip pain especially if she walks multiple days in a row.   Her highest weight was around 400 lbs   Pharmacotherapy for weight loss: She is not currently taking medications  for medical weight loss.  Denies side effects.    Previous pharmacotherapy for medical weight loss:  None  Bariatric surgery:  Patient has not had bariatric surgery  Pharmacotherapy for DMT2:  She is currently taking Mounjaro 7.5mg .  Reports side effects of constipation.   Last A1c was 5.7 She is not checking BS at home.   Episodes of hypoglycemia: no On ACE or ARB, ASA  81mg  and statin.  Last eye exam:  11/24    Lab Results  Component Value Date   HGBA1C 5.7 (H) 12/09/2022   HGBA1C 6.7 (H) 06/11/2022   HGBA1C 6.7 (H) 08/22/2021   Lab Results  Component Value Date   MICROALBUR <0.7 04/12/2016   LDLCALC 62 12/09/2022   CREATININE 0.69 12/09/2022       PHYSICAL EXAM:  Blood pressure 138/73, pulse 67, temperature 98 F (36.7 C), height 5\' 5"  (1.651 m), weight (!) 313 lb (142 kg), SpO2 98%. Body mass index is 52.09 kg/m.  General: She is overweight, cooperative, alert, well developed, and in no acute distress. PSYCH: Has normal mood, affect and thought process.   Extremities: No edema.  Neurologic: No gross sensory or motor deficits. No tremors or fasciculations noted.    DIAGNOSTIC DATA REVIEWED:  BMET    Component Value Date/Time   NA 142 12/09/2022 0809   K 4.0 12/09/2022 0809   CL 102 12/09/2022 0809   CO2 22 12/09/2022 0809   GLUCOSE 91 12/09/2022 0809   GLUCOSE 108 (H) 08/22/2021 1004   BUN 15 12/09/2022 0809   CREATININE 0.69 12/09/2022 0809   CALCIUM 9.7 12/09/2022 0809   GFRNONAA 103 12/16/2018 0817   GFRAA 118 12/16/2018 0817   Lab Results  Component Value Date   HGBA1C 5.7 (H) 12/09/2022   HGBA1C 7.6 (H) 11/09/2014  Lab Results  Component Value Date   INSULIN 11.2 12/09/2022   INSULIN 9.1 10/20/2017   Lab Results  Component Value Date   TSH 2.610 06/11/2022   CBC    Component Value Date/Time   WBC 6.5 08/22/2021 1004   RBC 4.37 08/22/2021 1004   HGB 13.0 08/22/2021 1004   HGB 15.0 10/20/2017 0940   HCT 39.3 08/22/2021 1004   HCT 48.0 (H) 10/20/2017 0940   PLT 256.0 08/22/2021 1004   MCV 90.1 08/22/2021 1004   MCV 90 10/20/2017 0940   MCH 28.0 10/20/2017 0940   MCH 27.3 04/05/2017 0443   MCHC 33.1 08/22/2021 1004   RDW 14.0 08/22/2021 1004   RDW 16.0 (H) 10/20/2017 0940   Iron Studies    Component Value Date/Time   FERRITIN 204 (H) 12/09/2022 0809   Lipid Panel     Component Value Date/Time    CHOL 134 12/09/2022 0809   TRIG 82 12/09/2022 0809   HDL 56 12/09/2022 0809   CHOLHDL 2 08/22/2021 1004   VLDL 15.0 08/22/2021 1004   LDLCALC 62 12/09/2022 0809   Hepatic Function Panel     Component Value Date/Time   PROT 7.6 12/09/2022 0809   ALBUMIN 4.5 12/09/2022 0809   AST 24 12/09/2022 0809   ALT 35 (H) 12/09/2022 0809   ALKPHOS 128 (H) 12/09/2022 0809   BILITOT 0.5 12/09/2022 0809      Component Value Date/Time   TSH 2.610 06/11/2022 1141   Nutritional Lab Results  Component Value Date   VD25OH 71.2 12/09/2022   VD25OH 64.2 06/11/2022   VD25OH 53.54 08/22/2021     ASSESSMENT AND PLAN  TREATMENT PLAN FOR OBESITY:  Recommended Dietary Goals  [name] is currently in the action stage of change. As such, her goal is to continue weight management plan. She has agreed to the Category 4 Plan.  Behavioral Intervention  We discussed the following Behavioral Modification Strategies today: continue to work on maintaining a reduced calorie state, getting the recommended amount of protein, incorporating whole foods, making healthy choices, staying well hydrated and practicing mindfulness when eating..  Additional resources provided today: NA  Recommended Physical Activity Goals  [name] has been advised to work up to 150 minutes of moderate intensity aerobic activity a week and strengthening exercises 2-3 times per week for cardiovascular health, weight loss maintenance and preservation of muscle mass.   She has agreed to Think about enjoyable ways to increase daily physical activity and overcoming barriers to exercise, Increase physical activity in their day and reduce sedentary time (increase NEAT)., Increase the intensity, frequency or duration of strengthening exercises , and Increase the intensity, frequency or duration of aerobic exercises      ASSOCIATED CONDITIONS ADDRESSED TODAY  Action/Plan  Type 2 diabetes mellitus without complication, without  long-term current use of insulin (HCC) -     Microalbumin / creatinine urine ratio -     Continue Tirzepatide; Inject 7.5 mg into the skin once a week.  Dispense: 2 mL; Refill: 1. .Side effects discussed.  Will not increase due to side effects of constipation.   Drug induced constipation Colace OTC PRN Miralax PRN Increase water and fiber intake Exercise Will continue to monitor.   Let us know if worsening Last cologuard 04/27/21 Never had a colonoscopy  Morbid obesity (HCC)  BMI 50.0-59.9, adult (HCC)     Last labs 12/08/17.  Microalbumin rechecked today.      Return in about 3 weeks (around 01/29/2023).[name] She was  informed of the importance of frequent follow up visits to maximize her success with intensive lifestyle modifications for her multiple health conditions.   ATTESTASTION STATEMENTS:  Reviewed by clinician on day of visit: allergies, medications, problem list, medical history, surgical history, family history, social history, and previous encounter notes.     Theodis Sato. Hermena Swint FNP-C

## 2023-01-10 LAB — MICROALBUMIN / CREATININE URINE RATIO
Creatinine, Urine: 32.8 mg/dL
Microalb/Creat Ratio: <9 mg/g{creat} (ref 0–29)
Microalbumin, Urine: <3 ug/mL

## 2023-01-28 ENCOUNTER — Ambulatory Visit: Payer: BC Managed Care – PPO | Admitting: Bariatrics

## 2023-01-28 ENCOUNTER — Encounter: Payer: Self-pay | Admitting: Bariatrics

## 2023-01-28 VITALS — BP 132/73 | HR 58 | Temp 97.6°F | Ht 65.0 in | Wt 313.0 lb

## 2023-01-28 DIAGNOSIS — E1169 Type 2 diabetes mellitus with other specified complication: Secondary | ICD-10-CM | POA: Diagnosis not present

## 2023-01-28 DIAGNOSIS — I10 Essential (primary) hypertension: Secondary | ICD-10-CM

## 2023-01-28 DIAGNOSIS — Z6841 Body Mass Index (BMI) 40.0 and over, adult: Secondary | ICD-10-CM

## 2023-01-28 DIAGNOSIS — E66813 Obesity, class 3: Secondary | ICD-10-CM | POA: Diagnosis not present

## 2023-01-28 DIAGNOSIS — E785 Hyperlipidemia, unspecified: Secondary | ICD-10-CM | POA: Diagnosis not present

## 2023-01-28 DIAGNOSIS — Z7985 Long-term (current) use of injectable non-insulin antidiabetic drugs: Secondary | ICD-10-CM

## 2023-01-28 DIAGNOSIS — E119 Type 2 diabetes mellitus without complications: Secondary | ICD-10-CM

## 2023-01-28 MED ORDER — TIRZEPATIDE 10 MG/0.5ML ~~LOC~~ SOAJ
10.0000 mg | SUBCUTANEOUS | 0 refills | Status: DC
Start: 2023-01-28 — End: 2023-02-24

## 2023-01-28 MED ORDER — ATORVASTATIN CALCIUM 20 MG PO TABS: 20.0000 mg | ORAL_TABLET | Freq: Every day | ORAL | 0 refills | Status: DC

## 2023-01-28 MED ORDER — METOPROLOL SUCCINATE ER 50 MG PO TB24: ORAL_TABLET | ORAL | 0 refills | Status: DC

## 2023-01-28 MED ORDER — LOSARTAN POTASSIUM 100 MG PO TABS: 100.0000 mg | ORAL_TABLET | Freq: Every day | ORAL | 0 refills | Status: DC

## 2023-01-28 NOTE — Progress Notes (Signed)
WEIGHT SUMMARY AND BIOMETRICS  Weight Lost Since Last Visit: 0  Weight Gained Since Last Visit: 0   Vitals Temp: 97.6 F (36.4 C) BP: 132/73 Pulse Rate: (!) 58 SpO2: 98 %   Anthropometric Measurements Height: 5\' 5"  (1.651 m) Weight: (!) 313 lb (142 kg) BMI (Calculated): 52.09 Weight at Last Visit: 313lb Weight Lost Since Last Visit: 0 Weight Gained Since Last Visit: 0 Starting Weight: 369lb Total Weight Loss (lbs): 56 lb (25.4 kg)   Body Composition  Body Fat %: 58.7 % Fat Mass (lbs): 183.8 lbs Muscle Mass (lbs): 122.6 lbs Visceral Fat Rating : 23   Other Clinical Data Fasting: no Labs: no Today's Visit #: 10 Starting Date: 06/11/22    OBESITY [name] is here to discuss her progress with her obesity treatment plan along with follow-up of her obesity related diagnoses.    Nutrition Plan: the Category 4 plan - 70% adherence.  Current exercise: walking  Interim History:  Her weight remains the same.  Eating all of the food on the plan., Protein intake is as prescribed, Is not skipping meals, and Water intake is adequate.   Pharmacotherapy: [name] is on Mounjaro 7.5 mg SQ weekly Adverse side effects: Constipation Hunger is moderately controlled.  Cravings are moderately controlled.   Assessment/Plan:   1. Type 2 diabetes mellitus without complication, without long-term current use of insulin (HCC)  HgbA1c is at goal. Last A1c was 5.7 CBGs: Not checking      Episodes of hypoglycemia: no Medication(s): Mounjaro 7.5 mg SQ weekly, Metformin 1,000 mg BID.   Lab Results  Component Value Date   HGBA1C 5.7 (H) 12/09/2022   HGBA1C 6.7 (H) 06/11/2022   HGBA1C 6.7 (H) 08/22/2021   Lab Results  Component Value Date   MICROALBUR <0.7 04/12/2016   LDLCALC 62 12/09/2022   CREATININE 0.69 12/09/2022   Lab Results  Component Value Date   GFR  100.23 08/22/2021   GFR 100.80 04/07/2020   GFR 100.09 04/10/2017    Plan: Continue and increase dose Mounjaro 10 mg SQ weekly Continue all other medications.  Will keep all carbohydrates low both sweets and starches.  Will continue exercise regimen to 30 to 60 minutes on most days of the week.  Aim for 7 to 9 hours of sleep nightly.  Eat more low glycemic index foods.  Will use the stool softeners, and Miralax.  2. Essential hypertension Hypertension Hypertension improved.  Medication(s): Losartan and Toprol-XL 50 mg  BP Readings from Last 3 Encounters:  01/28/23 132/73  01/08/23 138/73  12/09/22 (!) 143/82   Lab Results  Component Value Date   CREATININE 0.69 12/09/2022   CREATININE 0.61 06/11/2022   CREATININE 0.61 08/22/2021   Lab Results  Component Value Date   GFR 100.23 08/22/2021   GFR 100.80 04/07/2020   GFR 100.09 04/10/2017    Plan: Continue all  antihypertensives at current dosages. No added salt. Will keep sodium content to 1,500 mg or less per day.    3. Hyperlipidemia associated with type 2 diabetes mellitus (HCC)  LDL is at goal. Medication(s): Lipitor Cardiovascular risk factors: diabetes mellitus, hypertension, obesity (BMI >= 30 kg/m2), and sedentary lifestyle  Lab Results  Component Value Date   CHOL 134 12/09/2022   HDL 56 12/09/2022   LDLCALC 62 12/09/2022   TRIG 82 12/09/2022   CHOLHDL 2 08/22/2021   Lab Results  Component Value Date   ALT 35 (H) 12/09/2022   AST 24 12/09/2022   ALKPHOS 128 (H) 12/09/2022   BILITOT 0.5 12/09/2022   The 10-year ASCVD risk score (Arnett DK, et al., 2019) is: 4.5%   Values used to calculate the score:     Age: 57 years     Sex: Female     Is Non-Hispanic African American: No     Diabetic: Yes     Tobacco smoker: No     Systolic Blood Pressure: 132 mmHg     Is BP treated: Yes     HDL Cholesterol: 56 mg/dL     Total Cholesterol: 134 mg/dL  Plan:  Continue statin.  Will avoid all trans fats.   Will read labels Will minimize saturated fats except the following: low fat meats in moderation, diary, and limited dark chocolate.      Morbid Obesity: Current BMI BMI (Calculated): 52.09   Pharmacotherapy Plan Continue and increase dose  Mounjaro 10 mg SQ weekly  [name] is currently in the action stage of change. As such, her goal is to continue with weight loss efforts.  She has agreed to the Category 4 plan.  Exercise goals: For substantial health benefits, adults should do at least 150 minutes (2 hours and 30 minutes) a week of moderate-intensity, or 75 minutes (1 hour and 15 minutes) a week of vigorous-intensity aerobic physical activity, or an equivalent combination of moderate- and vigorous-intensity aerobic activity. Aerobic activity should be performed in episodes of at least 10 minutes, and preferably, it should be spread throughout the week. Will try to get back into walking and will use light weights.  Behavioral modification strategies: increasing lean protein intake, decreasing simple carbohydrates , no meal skipping, meal planning , better snacking choices, planning for success, measure portion sizes, and mindful eating.  [name] has agreed to follow-up with our clinic in 4 weeks.      Objective:   VITALS: Per patient if applicable, see vitals. GENERAL: Alert and in no acute distress. CARDIOPULMONARY: No increased WOB. Speaking in clear sentences.  PSYCH: Pleasant and cooperative. Speech normal rate and rhythm. Affect is appropriate. Insight and judgement are appropriate. Attention is focused, linear, and appropriate.  NEURO: Oriented as arrived to appointment on time with no prompting.   Attestation Statements:    This was prepared with the assistance of Engineer, civil (consulting).  Occasional wrong-word or sound-a-like substitutions may have occurred due to the inherent limitations of voice recognition   Corinna Capra, DO

## 2023-01-31 ENCOUNTER — Other Ambulatory Visit (HOSPITAL_COMMUNITY): Payer: Self-pay | Admitting: Family Medicine

## 2023-01-31 DIAGNOSIS — Z1231 Encounter for screening mammogram for malignant neoplasm of breast: Secondary | ICD-10-CM

## 2023-02-06 ENCOUNTER — Ambulatory Visit (HOSPITAL_COMMUNITY)
Admission: RE | Admit: 2023-02-06 | Discharge: 2023-02-06 | Disposition: A | Discharge status: Home / Self Care | Payer: BC Managed Care – PPO | Source: Ambulatory Visit

## 2023-02-06 DIAGNOSIS — Z1231 Encounter for screening mammogram for malignant neoplasm of breast: Secondary | ICD-10-CM | POA: Insufficient documentation

## 2023-02-24 ENCOUNTER — Encounter: Payer: Self-pay | Admitting: Bariatrics

## 2023-02-24 ENCOUNTER — Other Ambulatory Visit: Payer: Self-pay | Admitting: Bariatrics

## 2023-02-24 MED ORDER — TIRZEPATIDE 10 MG/0.5ML ~~LOC~~ SOAJ: 10.0000 mg | SUBCUTANEOUS | 0 refills | Status: DC

## 2023-02-25 ENCOUNTER — Ambulatory Visit: Payer: BC Managed Care – PPO | Admitting: Bariatrics

## 2023-02-28 ENCOUNTER — Ambulatory Visit (INDEPENDENT_AMBULATORY_CARE_PROVIDER_SITE_OTHER): Payer: 59 | Admitting: Family Medicine

## 2023-02-28 ENCOUNTER — Encounter: Payer: Self-pay | Admitting: Family Medicine

## 2023-02-28 VITALS — BP 120/80 | HR 69 | Temp 98.4°F | Ht 65.0 in | Wt 318.2 lb

## 2023-02-28 DIAGNOSIS — Z7985 Long-term (current) use of injectable non-insulin antidiabetic drugs: Secondary | ICD-10-CM | POA: Diagnosis not present

## 2023-02-28 DIAGNOSIS — Z23 Encounter for immunization: Secondary | ICD-10-CM

## 2023-02-28 DIAGNOSIS — Z0000 Encounter for general adult medical examination without abnormal findings: Secondary | ICD-10-CM

## 2023-02-28 DIAGNOSIS — E119 Type 2 diabetes mellitus without complications: Secondary | ICD-10-CM

## 2023-02-28 DIAGNOSIS — Z7984 Long term (current) use of oral hypoglycemic drugs: Secondary | ICD-10-CM

## 2023-02-28 DIAGNOSIS — B070 Plantar wart: Secondary | ICD-10-CM

## 2023-02-28 NOTE — Progress Notes (Signed)
Established Patient Office Visit   Subjective  Patient ID: [name], female    DOB: 06/21/1965  Age: 58 y.o. MRN: [id number]  Chief Complaint  Patient presents with   Annual Exam    Patient is a 58 year old female seen for CPE.  Patient states she is doing well overall.      ROS Negative unless stated above    Objective:     BP 120/80 (BP Location: Left Arm, Patient Position: Sitting, Cuff Size: Large)   Pulse 69   Temp 98.4 F (36.9 C) (Oral)   Ht 5\' 5"  (1.651 m)   Wt (!) 318 lb 3.2 oz (144.3 kg)   LMP  (LMP Unknown)   SpO2 97%   BMI 52.95 kg/m    Physical Exam Constitutional:      Appearance: Normal appearance.  HENT:     Head: Normocephalic and atraumatic.     Right Ear: Tympanic membrane, ear canal and external ear normal.     Left Ear: Tympanic membrane, ear canal and external ear normal.     Nose: Nose normal.     Mouth/Throat:     Mouth: Mucous membranes are moist.     Pharynx: No oropharyngeal exudate or posterior oropharyngeal erythema.  Eyes:     General: No scleral icterus.    Extraocular Movements: Extraocular movements intact.     Conjunctiva/sclera: Conjunctivae normal.     Pupils: Pupils are equal, round, and reactive to light.  Neck:     Thyroid: No thyromegaly.  Cardiovascular:     Rate and Rhythm: Normal rate and regular rhythm.     Pulses: Normal pulses.     Heart sounds: Normal heart sounds. No murmur heard.    No friction rub.  Pulmonary:     Effort: Pulmonary effort is normal.     Breath sounds: Normal breath sounds. No wheezing, rhonchi or rales.  Abdominal:     General: Bowel sounds are normal.     Palpations: Abdomen is soft.     Tenderness: There is no abdominal tenderness.  Musculoskeletal:        General: No deformity. Normal range of motion.  Lymphadenopathy:     Cervical: No cervical adenopathy.  Skin:    General: Skin is warm and dry.     Findings: No lesion.     Comments: Calluses on heels of  bilateral feet.  Plantar wart on left foot.  Neurological:     General: No focal deficit present.     Mental Status: She is alert and oriented to person, place, and time.  Psychiatric:        Mood and Affect: Mood normal.        Thought Content: Thought content normal.    Diabetic Foot Exam - Simple   Simple Foot Form Diabetic Foot exam was performed with the following findings: Yes 02/28/2023  8:49 AM  Visual Inspection See comments: Yes Sensation Testing Intact to touch and monofilament testing bilaterally: Yes See comments: Yes Pulse Check Posterior Tibialis and Dorsalis pulse intact bilaterally: Yes Comments Calluses of plantar surface of bilateral feet especially at heels.  Plantar wart on plantar surface of left foot.       02/28/2023    9:02 AM 06/11/2022   10:38 AM 08/22/2021    9:24 AM 03/03/2018    7:54 AM 11/13/2017    8:02 AM  Depression screen PHQ 2/9  Decreased Interest 0 3 0 0 0  Down, Depressed, Hopeless 0 1  0 0 0  PHQ - 2 Score 0 4 0 0 0  Altered sleeping 0 1 0 0 0  Tired, decreased energy 0 3 1 0 0  Change in appetite 1 1 1  0 0  Feeling bad or failure about yourself  0 1 1 0 0  Trouble concentrating 0 0 0 0 0  Moving slowly or fidgety/restless 0 1 0 0 0  Suicidal thoughts 0 0 0 0 0  PHQ-9 Score 1 11 3  0 0  Difficult doing work/chores Not difficult at all Somewhat difficult Not difficult at all        02/28/2023    9:02 AM 11/05/2017    4:55 PM  GAD 7 : Generalized Anxiety Score  Nervous, Anxious, on Edge 1 1  Control/stop worrying 0 0  Worry too much - different things 0 0  Trouble relaxing 0 1  Restless 0 0  Easily annoyed or irritable 0 0  Afraid - awful might happen 0 0  Total GAD 7 Score 1 2  Anxiety Difficulty Not difficult at all Not difficult at all    No results found for any visits on 02/28/23.    Assessment & Plan:  Well adult exam -age appropriate health screenings discussed -Recent labs from weight management  reviewed. -immunizations reviewed.  Pt had PPV 23 03/13/2015.  Pneumonia vaccine given this visit. -Cologuard negative in 2023.  Repeat in 2026. -mammogram 02/06/23 -pap done 10/20/2018, due later this yr. -next CPE in 1 yr.  Type 2 diabetes mellitus without complication, without long-term current use of insulin (HCC) -controlled.  Hgb A1C 5.7% on 12/09/22. -continue lifestyle modifications -continue current medications Mounjaro 10 mg weekly per wt management and Metformin 1000 mg BID.  Consider decreasing metformin dose given well controlled A1C. -     Ambulatory referral to Podiatry  Need for pneumococcal 20-valent conjugate vaccination -     Pneumococcal conjugate vaccine 20-valent  Plantar wart -     Ambulatory referral to Podiatry   Return in 4 months (on 06/28/2023), or if symptoms worsen or fail to improve.   Deeann Saint, MD

## 2023-03-24 ENCOUNTER — Ambulatory Visit: Payer: 59 | Admitting: Podiatry

## 2023-03-24 ENCOUNTER — Ambulatory Visit (INDEPENDENT_AMBULATORY_CARE_PROVIDER_SITE_OTHER): Payer: 59

## 2023-03-24 DIAGNOSIS — M722 Plantar fascial fibromatosis: Secondary | ICD-10-CM | POA: Diagnosis not present

## 2023-03-24 DIAGNOSIS — Q828 Other specified congenital malformations of skin: Secondary | ICD-10-CM

## 2023-03-24 DIAGNOSIS — M778 Other enthesopathies, not elsewhere classified: Secondary | ICD-10-CM

## 2023-03-25 ENCOUNTER — Encounter: Payer: Self-pay | Admitting: Bariatrics

## 2023-03-25 ENCOUNTER — Ambulatory Visit: Payer: 59 | Admitting: Bariatrics

## 2023-03-25 VITALS — BP 125/56 | HR 60 | Temp 97.6°F | Ht 65.0 in | Wt 314.0 lb

## 2023-03-25 DIAGNOSIS — K5900 Constipation, unspecified: Secondary | ICD-10-CM | POA: Diagnosis not present

## 2023-03-25 DIAGNOSIS — E119 Type 2 diabetes mellitus without complications: Secondary | ICD-10-CM

## 2023-03-25 DIAGNOSIS — K5903 Drug induced constipation: Secondary | ICD-10-CM

## 2023-03-25 DIAGNOSIS — Z6841 Body Mass Index (BMI) 40.0 and over, adult: Secondary | ICD-10-CM | POA: Diagnosis not present

## 2023-03-25 DIAGNOSIS — Z7985 Long-term (current) use of injectable non-insulin antidiabetic drugs: Secondary | ICD-10-CM

## 2023-03-25 MED ORDER — TIRZEPATIDE 12.5 MG/0.5ML ~~LOC~~ SOAJ: 12.5000 mg | SUBCUTANEOUS | 0 refills | Status: DC

## 2023-03-25 NOTE — Progress Notes (Signed)
WEIGHT SUMMARY AND BIOMETRICS  Weight Lost Since Last Visit: 0  Weight Gained Since Last Visit: 1lb   Vitals Temp: 97.6 F (36.4 C) BP: (!) 125/56 Pulse Rate: 60 SpO2: 98 %   Anthropometric Measurements Height: 5\' 5"  (1.651 m) Weight: (!) 314 lb (142.4 kg) BMI (Calculated): 52.25 Weight at Last Visit: 313lb Weight Lost Since Last Visit: 0 Weight Gained Since Last Visit: 1lb Starting Weight: 369lb Total Weight Loss (lbs): 55 lb (24.9 kg)   Body Composition  Body Fat %: 59.1 % Fat Mass (lbs): 185.8 lbs Muscle Mass (lbs): 122.2 lbs Visceral Fat Rating : 23   Other Clinical Data Fasting: yes Labs: no Today's Visit #: 11 Starting Date: 06/11/22    OBESITY [name] is here to discuss her progress with her obesity treatment plan along with follow-up of her obesity related diagnoses.    Nutrition Plan: the Category 4 plan - 50% adherence.  Current exercise: none  Interim History:  She is up 1 lb since her last visit.  Eating all of the food on the plan., Protein intake is as prescribed, Is skipping meals, Meeting calorie goals., Water intake is adequate., and Denies excessive cravings.   Pharmacotherapy: [name] is on Mounjaro 10 mg SQ weekly Adverse side effects: Constipation Hunger is moderately controlled.  Cravings are moderately controlled.  Assessment/Plan:   Type II Diabetes HgbA1c is at goal. Last A1c was 5.7 CBGs: Not checking      Episodes of hypoglycemia: no Medication(s): Mounjaro 10 mg SQ weekly  Lab Results  Component Value Date   HGBA1C 5.7 (H) 12/09/2022   HGBA1C 6.7 (H) 06/11/2022   HGBA1C 6.7 (H) 08/22/2021   Lab Results  Component Value Date   MICROALBUR <0.7 04/12/2016   LDLCALC 62 12/09/2022   CREATININE 0.69 12/09/2022   Lab Results  Component Value Date   GFR 100.23 08/22/2021   GFR 100.80 04/07/2020   GFR  100.09 04/10/2017    Plan: Continue and increase dose Mounjaro 12.5 mg SQ weekly Continue all other medications.  Will keep all carbohydrates low both sweets and starches.  Will continue exercise regimen to 30 to 60 minutes on most days of the week.  Aim for 7 to 9 hours of sleep nightly.  Eat more low glycemic index foods.   Constipation [name] notes constipation.   This is likely related to GLP-1.  She has been taking Colace and Miralax occasionally . Constipation is moderately controlled.   Plan: Increase fiber slowly up to 25 to 30 grams of fiber.   Increase water intake to at least 64 ounces daily.  Add MiraLAX once daily.  May take twice a day or every other day based on results. Will continue Colace.     Morbid Obesity: Current BMI BMI (Calculated): 52.25   Pharmacotherapy Plan Continue and refill Mounjaro increased to 12.5 mg weekly.  Barbee is currently in the action stage of change. As such, her goal is to continue with weight loss efforts.  She has agreed to the Category 4 plan.  Exercise goals: For substantial health benefits, adults should do at least 150 minutes (2 hours and 30 minutes) a week of moderate-intensity, or 75 minutes (1 hour and 15 minutes) a week of vigorous-intensity aerobic physical activity, or an equivalent combination of moderate- and vigorous-intensity aerobic activity. Aerobic activity should be performed in episodes of at least 10 minutes, and preferably, it should be spread throughout the week.  Behavioral modification strategies: increasing lean protein intake, decreasing simple carbohydrates , no meal skipping, meal planning , increase water intake, better snacking choices, planning for success, and avoiding temptations.  Clora has agreed to follow-up with our clinic in 4 weeks.      Objective:   VITALS: Per patient if applicable, see vitals. GENERAL: Alert and in no acute distress. CARDIOPULMONARY: No increased WOB. Speaking in  clear sentences.  PSYCH: Pleasant and cooperative. Speech normal rate and rhythm. Affect is appropriate. Insight and judgement are appropriate. Attention is focused, linear, and appropriate.  NEURO: Oriented as arrived to appointment on time with no prompting.   Attestation Statements:     This was prepared with the assistance of Engineer, civil (consulting).  Occasional wrong-word or sound-a-like substitutions may have occurred due to the inherent limitations of voice recognition   Wanda Capra, DO

## 2023-03-26 DIAGNOSIS — M722 Plantar fascial fibromatosis: Secondary | ICD-10-CM | POA: Diagnosis not present

## 2023-03-26 MED ORDER — TRIAMCINOLONE ACETONIDE 10 MG/ML IJ SUSP
10.0000 mg | Freq: Once | INTRAMUSCULAR | Status: AC
Start: 2023-03-26 — End: 2023-03-26
  Administered 2023-03-26: 10 mg via INTRA_ARTICULAR

## 2023-03-26 NOTE — Progress Notes (Signed)
Subjective:   Patient ID: [name], female   DOB: 58 y.o.   MRN: [id number]   HPI Patient presents with a lot of pain in the left heel and states has been hurting her for several months and she does have depressed arches   ROS      Objective:  Physical Exam  Neuro vas scaler status intact patient is a diabetic has inflammation pain of the plantar fascia left and into the mid band of the fascia around the arch with moderate obesity     Assessment:  Acute fasciitis left inflammation fluid medial band     Plan:  H&P reviewed went ahead today did sterile prep injected the medial band fascia at insertion slightly distal 3 mg Kenalog 5 mg Xylocaine and instructed on support and dispensed fascial brace and discussed long-term orthotics.  Reappoint 2 weeks  X-rays indicate depression of the arch did not indicate any other pathology currently

## 2023-03-27 ENCOUNTER — Ambulatory Visit: Payer: 59 | Admitting: Obstetrics and Gynecology

## 2023-03-27 ENCOUNTER — Encounter: Payer: Self-pay | Admitting: Obstetrics and Gynecology

## 2023-03-27 ENCOUNTER — Other Ambulatory Visit (HOSPITAL_COMMUNITY)
Admission: RE | Admit: 2023-03-27 | Discharge: 2023-03-27 | Disposition: A | Discharge status: Home / Self Care | Payer: 59 | Source: Ambulatory Visit | Attending: Obstetrics and Gynecology | Admitting: Obstetrics and Gynecology

## 2023-03-27 VITALS — BP 122/80 | HR 74 | Ht 64.0 in | Wt 316.0 lb

## 2023-03-27 DIAGNOSIS — Z975 Presence of (intrauterine) contraceptive device: Secondary | ICD-10-CM | POA: Diagnosis present

## 2023-03-27 DIAGNOSIS — Z01419 Encounter for gynecological examination (general) (routine) without abnormal findings: Secondary | ICD-10-CM | POA: Insufficient documentation

## 2023-03-27 DIAGNOSIS — E2839 Other primary ovarian failure: Secondary | ICD-10-CM

## 2023-03-27 DIAGNOSIS — Z1331 Encounter for screening for depression: Secondary | ICD-10-CM | POA: Diagnosis not present

## 2023-03-27 NOTE — Progress Notes (Signed)
58 y.o. y.o. female here for annual exam. G2P2 Has 88 and 19 year old daughter and one lives at home other lives nearby  No LMP recorded (lmp unknown). (Menstrual status: IUD).    Pelvic discharge: denies Mirena IUD in place since 2020 Body mass index is 54.24 kg/m.   New/established gyn, aex//jj PAP: 10-20-18, MMG: 02-06-23, COLOGUARD: 04-27-21   Body mass index is 54.24 kg/m. Going to [facility] in [place] for [employer] loss management.      03/27/2023    2:08 PM 03/27/2023    2:03 PM 02/28/2023    9:02 AM  Depression screen PHQ 2/9  Decreased Interest 0 0 0  Down, Depressed, Hopeless 0 0 0  PHQ - 2 Score 0 0 0  Altered sleeping   0  Tired, decreased energy   0  Change in appetite   1  Feeling bad or failure about yourself    0  Trouble concentrating   0  Moving slowly or fidgety/restless   0  Suicidal thoughts   0  PHQ-9 Score   1  Difficult doing work/chores   Not difficult at all    Blood pressure 122/80, pulse 74, height 5\' 4"  (1.626 m), weight (!) 316 lb (143.3 kg), SpO2 99%.  No results found for: "DIAGPAP", "HPVHIGH", "ADEQPAP"  GYN HISTORY: No results found for: "DIAGPAP", "HPVHIGH", "ADEQPAP"  OB History  Gravida Para Term Preterm AB Living  2 2    2   SAB IAB Ectopic Multiple Live Births          # Outcome Date GA Lbr Len/2nd Weight Sex Type Anes PTL Lv  2 Para           1 Para             Past Medical History:  Diagnosis Date   B12 deficiency    Back pain    Chronic diastolic HF (heart failure) (HCC)    Diabetes (HCC)    Edema of both lower extremities    GERD (gastroesophageal reflux disease)    Hyperlipemia    Hypertension    Joint pain    Knee pain    Obesity    Sleep apnea    SOBOE (shortness of breath on exertion)    Venous insufficiency    Vitamin D deficiency     Past Surgical History:  Procedure Laterality Date   Birth Mark removed     CHOLECYSTECTOMY     INTRAUTERINE DEVICE INSERTION  01/05/2019    Mirena   WISDOM TOOTH EXTRACTION  1983    Current Outpatient Medications on File Prior to Visit  Medication Sig Dispense Refill   atorvastatin (LIPITOR) 20 MG tablet Take 1 tablet (20 mg total) by mouth daily. 90 tablet 0   blood glucose meter kit and supplies Dispense based on patient and insurance preference. Use up to twice daily as directed. (FOR ICD-10 E10.9, E11.9). 1 each 0   cetirizine (ZYRTEC) 10 MG tablet      Cyanocobalamin (VITAMIN B 12 PO)      fluticasone (FLONASE) 50 MCG/ACT nasal spray Place into both nostrils as needed for allergies or rhinitis.     Glucosamine-Chondroitin-Vit D3 1500-1200-800 MG-MG-UNIT PACK      levonorgestrel (MIRENA) 20 MCG/24HR IUD by Intrauterine route.     losartan (COZAAR) 100 MG tablet Take 1 tablet (100 mg total) by mouth daily. 90 tablet 0   metFORMIN (GLUCOPHAGE) 1000 MG tablet TAKE 1 TABLET (1,000  MG TOTAL) BY MOUTH TWICE A DAY WITH FOOD 180 tablet 1   metoprolol succinate (TOPROL-XL) 50 MG 24 hr tablet TAKE ONE TABLET BY MOUTH ONCE DAILY WITH FOOD 90 tablet 0   tirzepatide (MOUNJARO) 10 MG/0.5ML Pen Inject 10 mg into the skin once a week.     Vitamin D, Cholecalciferol, 50 MCG (2000 UT) CAPS Take 1 capsule by mouth daily.     tirzepatide (MOUNJARO) 12.5 MG/0.5ML Pen Inject 12.5 mg into the skin once a week. (Patient not taking: Reported on 03/27/2023) 2 mL 0   No current facility-administered medications on file prior to visit.    Social History   Socioeconomic History   Marital status: Married    Spouse name: [place]isia Landgrebe   Number of children: 2   Years of education: Not on file   Highest education level: Bachelor's degree (e.g., BA, AB, BS)  Occupational History   Occupation: Educator  Tobacco Use   Smoking status: Never   Smokeless tobacco: Never  Vaping Use   Vaping status: Never Used  Substance and Sexual Activity   Alcohol use: Not Currently   Drug use: No   Sexual activity: Yes    Partners: Male    Birth  control/protection: I.U.D.    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 - lives in [place] Situation: lives with husband and 2 children 17 and 13 in 2016      Spiritual Beliefs: Christian      Lifestyle: no regular exercise, diet is poor      Social Drivers of Corporate investment banker Strain: Low Risk  (02/27/2023)   Overall Financial Resource Strain (CARDIA)    Difficulty of Paying Living Expenses: Not hard at all  Food Insecurity: No Food Insecurity (02/27/2023)   Hunger Vital Sign    Worried About Running Out of Food in the Last Year: Never true    Ran Out of Food in the Last Year: Never true  Transportation Needs: No Transportation Needs (02/27/2023)   PRAPARE - Administrator, Civil Service (Medical): No    Lack of Transportation (Non-Medical): No  Physical Activity: Sufficiently Active (02/27/2023)   Exercise Vital Sign    Days of Exercise per Week: 5 days    Minutes of Exercise per Session: 30 min  Stress: No Stress Concern Present (02/27/2023)   [employer] of Occupational Health - Occupational Stress Questionnaire    Feeling of Stress : Only a little  Social Connections: Moderately Integrated (02/27/2023)   Social Connection and Isolation Panel [NHANES]    Frequency of Communication with Friends and Family: More than three times a week    Frequency of Social Gatherings with Friends and Family: More than three times a week    Attends Religious Services: More than 4 times per year    Active Member of [employer] or Organizations: No    Attends Engineer, structural: Not on file    Marital Status: Married  Catering manager Violence: Not on file    Family History  Problem Relation Age of Onset   Hypertension Mother    Anxiety disorder Mother    Kidney cancer Mother    Diabetes  Father    Hypertension Father    Obesity Father    Sleep apnea Father  Cancer Maternal Uncle        Pancreatic   Cancer Paternal Uncle        Kidney   Cancer Paternal Grandfather        Stomach     No Known Allergies    Patient's last menstrual period was No LMP recorded (lmp unknown). (Menstrual status: IUD)..            Review of Systems Alls systems reviewed and are negative.     Physical Exam Constitutional:      Appearance: Normal appearance.  Genitourinary:     Vulva and urethral meatus normal.     No lesions in the vagina.     Genitourinary Comments: IUD strings were not seen to get TV US to evaluate     Right Labia: No rash, lesions or skin changes.    Left Labia: No lesions, skin changes or rash.    No vaginal discharge or tenderness.     No vaginal prolapse present.    No vaginal atrophy present.     Right Adnexa: not tender, not palpable and no mass present.    Left Adnexa: not tender, not palpable and no mass present.    No cervical motion tenderness or discharge.     Uterus is not enlarged, tender or irregular.  Breasts:    Right: Normal.     Left: Normal.  HENT:     Head: Normocephalic.  Neck:     Thyroid: No thyroid mass, thyromegaly or thyroid tenderness.  Cardiovascular:     Rate and Rhythm: Normal rate and regular rhythm.     Heart sounds: Normal heart sounds, S1 normal and S2 normal.  Pulmonary:     Effort: Pulmonary effort is normal.     Breath sounds: Normal breath sounds and air entry.  Abdominal:     General: There is no distension.     Palpations: Abdomen is soft. There is no mass.     Tenderness: There is no abdominal tenderness. There is no guarding or rebound.  Musculoskeletal:        General: Normal range of motion.     Cervical back: Full passive range of motion without pain, normal range of motion and neck supple. No tenderness.     Right lower leg: No edema.     Left lower leg: No edema.  Neurological:     Mental Status:  She is alert.  Skin:    General: Skin is warm.  Psychiatric:        Mood and Affect: Mood normal.        Behavior: Behavior normal.        Thought Content: Thought content normal.  Vitals and nursing note reviewed. Exam conducted with a chaperone present.       A:         Well Woman GYN exam                             P:        Pap smear collected today Encouraged annual mammogram screening Colon cancer screening up-to-date DXA ordered today Labs and immunizations to do with PMD Discussed breast self exams Encouraged healthy lifestyle practices Encouraged Vit D and Calcium  IUD strings not seen on exam today: to get PUS in office.  Order placed.  Discussed risk with high BMI and unopposed estrogen on the endometrial lining and risk for endometrial cancer.  Discussed she  can continue for the full 8 years with the mirena for endometrial protection.  Patient praised for weight loss efforts.   No follow-ups on file.  Earley Favor

## 2023-03-30 ENCOUNTER — Other Ambulatory Visit: Payer: Self-pay | Admitting: Family Medicine

## 2023-03-31 ENCOUNTER — Encounter: Payer: Self-pay | Admitting: Obstetrics and Gynecology

## 2023-03-31 LAB — CYTOLOGY - PAP
Comment: NEGATIVE
Diagnosis: NEGATIVE
High risk HPV: NEGATIVE

## 2023-04-10 ENCOUNTER — Ambulatory Visit: Payer: 59 | Admitting: Podiatry

## 2023-04-10 ENCOUNTER — Encounter: Payer: Self-pay | Admitting: Podiatry

## 2023-04-10 VITALS — Ht 64.0 in | Wt 316.0 lb

## 2023-04-10 DIAGNOSIS — M722 Plantar fascial fibromatosis: Secondary | ICD-10-CM | POA: Diagnosis not present

## 2023-04-10 MED ORDER — TRIAMCINOLONE ACETONIDE 10 MG/ML IJ SUSP
10.0000 mg | Freq: Once | INTRAMUSCULAR | Status: AC
  Administered 2023-04-10: 10 mg via INTRA_ARTICULAR

## 2023-04-10 NOTE — Progress Notes (Signed)
 Subjective:   Patient ID: [name], female   DOB: 58 y.o.   MRN: [id number]   HPI Patient presents stating that she is somewhat improved still has an area of moderate intense pain no she has flatfeet that need to be worked up neuro   ROS      Objective:  Physical Exam  Vascular status intact moderate flatfoot deformity exquisite discomfort medial fascial band left at the insertional point 1 spot with improvement in other areas     Assessment:  Acute plantar fasciitis left improved but still has pain in the medial band with flatfoot deformity and obesity is complicating factors     Plan:  H&P reviewed and I have recommended careful injection 3 mg Kenalog 5 mg Xylocaine and I then recommended orthotics and patient is evaluated and casted by pedorthist for customized orthotic devices.  Reappoint when ready

## 2023-04-22 ENCOUNTER — Encounter: Payer: Self-pay | Admitting: Bariatrics

## 2023-04-22 ENCOUNTER — Ambulatory Visit: Payer: 59 | Admitting: Bariatrics

## 2023-04-22 VITALS — BP 130/80 | HR 63 | Temp 97.7°F | Ht 65.0 in | Wt 311.0 lb

## 2023-04-22 DIAGNOSIS — I10 Essential (primary) hypertension: Secondary | ICD-10-CM | POA: Diagnosis not present

## 2023-04-22 DIAGNOSIS — Z6841 Body Mass Index (BMI) 40.0 and over, adult: Secondary | ICD-10-CM

## 2023-04-22 DIAGNOSIS — Z7985 Long-term (current) use of injectable non-insulin antidiabetic drugs: Secondary | ICD-10-CM

## 2023-04-22 DIAGNOSIS — E119 Type 2 diabetes mellitus without complications: Secondary | ICD-10-CM | POA: Diagnosis not present

## 2023-04-22 MED ORDER — LOSARTAN POTASSIUM 100 MG PO TABS
100.0000 mg | ORAL_TABLET | Freq: Every day | ORAL | 0 refills | Status: DC
Start: 2023-04-22 — End: 2023-06-17

## 2023-04-22 MED ORDER — TIRZEPATIDE 12.5 MG/0.5ML ~~LOC~~ SOAJ: 12.5000 mg | SUBCUTANEOUS | 0 refills | Status: DC

## 2023-04-22 NOTE — Progress Notes (Addendum)
 WEIGHT SUMMARY AND BIOMETRICS  Weight Lost Since Last Visit: 3 lb  Weight Gained Since Last Visit: 0 lb   Vitals Temp: 97.7 F (36.5 C) BP: 130/80 Pulse Rate: 63 SpO2: 100 %   Anthropometric Measurements Height: 5\' 5"  (1.651 m) Weight: (!) 311 lb (141.1 kg) BMI (Calculated): 51.75 Weight at Last Visit: 314 lb Weight Lost Since Last Visit: 3 lb Weight Gained Since Last Visit: 0 lb Starting Weight: 369 lb Total Weight Loss (lbs): 58 lb (26.3 kg)   Body Composition  Body Fat %: 57.4 % Fat Mass (lbs): 178.6 lbs Muscle Mass (lbs): 125.8 lbs Visceral Fat Rating : 22   Other Clinical Data Fasting: No Labs: No Today's Visit #: 12 Starting Date: 06/11/22    OBESITY Nesta is here to discuss her progress with her obesity treatment plan along with follow-up of her obesity related diagnoses.    Nutrition Plan: the Category 4 plan - 70% adherence.  Current exercise: none  Interim History:  She is down 3 lbs since her last visit.  Eating all of the food on the plan., Protein intake is as prescribed, and Water intake is adequate. She still struggles with water.    Pharmacotherapy: Shanya is on Mounjaro 12.5 mg SQ weekly Adverse side effects: Nausea Hunger is moderately controlled.  Cravings are moderately controlled.   Assessment/Plan:   1. Essential hypertension Hypertension Hypertension well controlled.  Medication(s): Cozaar 100 mg once daily , and Toprol XL 50 mg  BP Readings from Last 3 Encounters:  04/22/23 130/80  03/27/23 122/80  03/25/23 (!) 125/56   Lab Results  Component Value Date   CREATININE 0.69 12/09/2022   CREATININE 0.61 06/11/2022   CREATININE 0.61 08/22/2021   Lab Results  Component Value Date   GFR 100.23 08/22/2021   GFR 100.80 04/07/2020   GFR 100.09 04/10/2017    Plan: Continue all antihypertensives at  current dosages. No added salt. Will keep sodium content to 1,500 mg or less per day.    Type II Diabetes HgbA1c is at goal. Last A1c was 5.7 She states that her appetite is more controlled with the higher dose of Zepbound. Episodes of hypoglycemia: no Medication(s): Zepbound 12.5 mg SQ weekly  Lab Results  Component Value Date   HGBA1C 5.7 (H) 12/09/2022   HGBA1C 6.7 (H) 06/11/2022   HGBA1C 6.7 (H) 08/22/2021   Lab Results  Component Value Date   MICROALBUR <0.7 04/12/2016   LDLCALC 62 12/09/2022   CREATININE 0.69 12/09/2022   Lab Results  Component Value Date   GFR 100.23 08/22/2021   GFR 100.80 04/07/2020   GFR 100.09 04/10/2017    Plan: Continue and refill Mounjaro 12.5 mg weekly Will continue to prepare meals at home.  Will keep all carbohydrates low both sweets and starches.  Will continue exercise regimen to 30 to 60 minutes on most days  of the week.  Aim for 7 to 9 hours of sleep nightly.  Eat more low glycemic index foods.    Morbid Obesity: Current BMI BMI (Calculated): 51.75   Pharmacotherapy Plan Continue and refill  Mounjaro 12.5 mg SQ weekly  Tiani is currently in the action stage of change. As such, her goal is to continue with weight loss efforts.  She has agreed to the Category 4 plan.  Exercise goals: All adults should avoid inactivity. Some physical activity is better than none, and adults who participate in any amount of physical activity gain some health benefits. She is seeing a podiatrist.   Behavioral modification strategies: increasing lean protein intake, decreasing simple carbohydrates , no meal skipping, meal planning , better snacking choices, planning for success, decrease snacking , and avoiding temptations.  Katasha has agreed to follow-up with our clinic in 4 weeks.       Objective:   VITALS: Per patient if applicable, see vitals. GENERAL: Alert and in no acute distress. CARDIOPULMONARY: No increased WOB. Speaking in clear  sentences.  PSYCH: Pleasant and cooperative. Speech normal rate and rhythm. Affect is appropriate. Insight and judgement are appropriate. Attention is focused, linear, and appropriate.  NEURO: Oriented as arrived to appointment on time with no prompting.   Attestation Statements:    This was prepared with the assistance of Engineer, civil (consulting).  Occasional wrong-word or sound-a-like substitutions may have occurred due to the inherent limitations of voice recognition   Corinna Capra, DO

## 2023-04-24 ENCOUNTER — Ambulatory Visit (INDEPENDENT_AMBULATORY_CARE_PROVIDER_SITE_OTHER): Payer: 59

## 2023-04-24 ENCOUNTER — Ambulatory Visit: Payer: 59 | Admitting: Obstetrics and Gynecology

## 2023-04-24 ENCOUNTER — Other Ambulatory Visit: Payer: Self-pay | Admitting: Obstetrics and Gynecology

## 2023-04-24 ENCOUNTER — Encounter: Payer: Self-pay | Admitting: Obstetrics and Gynecology

## 2023-04-24 VITALS — BP 112/78 | HR 73

## 2023-04-24 DIAGNOSIS — E2839 Other primary ovarian failure: Secondary | ICD-10-CM

## 2023-04-24 DIAGNOSIS — Z975 Presence of (intrauterine) contraceptive device: Secondary | ICD-10-CM | POA: Diagnosis not present

## 2023-04-24 DIAGNOSIS — R935 Abnormal findings on diagnostic imaging of other abdominal regions, including retroperitoneum: Secondary | ICD-10-CM | POA: Diagnosis not present

## 2023-04-24 DIAGNOSIS — Z01419 Encounter for gynecological examination (general) (routine) without abnormal findings: Secondary | ICD-10-CM

## 2023-04-25 NOTE — Progress Notes (Signed)
   Acute Office Visit  Subjective:    Patient ID: [name], female    DOB: 1965/07/11, 58 y.o.   MRN: [id number]   HPI 58 y.o. presents today for ultrasound & consult (Ultrasound & consult to view strings//jj) .  No LMP recorded. (Menstrual status: IUD).    PUS 8.62cm uterus Normal size and shape No myometrial masses  Endometrial lining 5.2mm IUD noted whitin the EM canal RO not seen LO WNL  No adnexal masses No free fluid  Review of Systems     Objective:    OBGyn Exam  BP 112/78   Pulse 73   SpO2 99%  Wt Readings from Last 3 Encounters:  04/22/23 (!) 311 lb (141.1 kg)  04/10/23 (!) 316 lb (143.3 kg)  03/27/23 (!) 316 lb (143.3 kg)      Assessment & Plan:  IUD mirena using for endometrial protein with increased BMI and menopause. To remove and replace in one year to have higher dose progesterone use for another 5 years. To get EMB since lining is above the 5mm threshold to have a biopsy and ensure they are normal and not precancerous.  Patient agreed.  She will schedule.  To take motrin an hour before the procedure.  Reviewed the procedure in detail and what to expect. Counseling done on [place] results  15 minutes spent on reviewing records, imaging,  and one on one patient time and counseling patient and documentation Dr. Judith Blonder

## 2023-04-29 ENCOUNTER — Other Ambulatory Visit: Payer: Self-pay | Admitting: Bariatrics

## 2023-04-29 DIAGNOSIS — E1169 Type 2 diabetes mellitus with other specified complication: Secondary | ICD-10-CM

## 2023-05-02 ENCOUNTER — Ambulatory Visit: Admitting: Obstetrics and Gynecology

## 2023-05-02 ENCOUNTER — Encounter: Payer: Self-pay | Admitting: Obstetrics and Gynecology

## 2023-05-02 ENCOUNTER — Other Ambulatory Visit (HOSPITAL_COMMUNITY)
Admission: RE | Admit: 2023-05-02 | Discharge: 2023-05-02 | Disposition: A | Discharge status: Home / Self Care | Source: Ambulatory Visit | Attending: Obstetrics and Gynecology | Admitting: Obstetrics and Gynecology

## 2023-05-02 VITALS — BP 118/82 | HR 61

## 2023-05-02 DIAGNOSIS — N841 Polyp of cervix uteri: Secondary | ICD-10-CM

## 2023-05-02 DIAGNOSIS — R935 Abnormal findings on diagnostic imaging of other abdominal regions, including retroperitoneum: Secondary | ICD-10-CM

## 2023-05-02 DIAGNOSIS — Z30432 Encounter for removal of intrauterine contraceptive device: Secondary | ICD-10-CM | POA: Diagnosis not present

## 2023-05-02 MED ORDER — LEVONORGESTREL 20 MCG/DAY IU IUD: 1.0000 | INTRAUTERINE_SYSTEM | Freq: Once | INTRAUTERINE | Status: DC

## 2023-05-02 NOTE — Progress Notes (Signed)
   Acute Office Visit  Subjective:    Patient ID: [name], female    DOB: 1965-11-29, 58 y.o.   MRN: [id number]   HPI 58 y.o. presents today for Procedure (Endo biopsy ) Mirena IUD in for 4 years. Slightly thickened endometrial lining with increased BMI. Using mirena for endometrial protection.  No LMP recorded. (Menstrual status: IUD).    Review of Systems     Objective:    OBGyn Exam  BP 118/82 (BP Location: Right Arm, Patient Position: Sitting, Cuff Size: Large)   Pulse 61   SpO2 96%  Wt Readings from Last 3 Encounters:  04/22/23 (!) 311 lb (141.1 kg)  04/10/23 (!) 316 lb (143.3 kg)  03/27/23 (!) 316 lb (143.3 kg)       PROCEDURE: EMB Consent obtained for the procedure.  A bivalve speculum was placed in the vagina.  The cervix was grasped with a single tooth tenaculum. A endocervical polyp was seen and removed.  The IUD was inadvertently removed at the same time. The EMB Pipelle was inserted and rotated.  Adequate specimen was obtained and sent to pathology.  All instruments were removed.  Patient tolerated the procedure well.  To notify patient of the results.   Patient informed chaperone available to be present for breast and/or pelvic exam. Patient has requested no chaperone to be present. Patient has been advised what will be completed during breast and pelvic exam.   Assessment & Plan:  Thickened lining, Mirena IUD for endometrial protection with body habitus. IUD removed today with cervical polyp removal. PA for new mirena IUD was placed. RTC next week for placement.  Should have replaced at 5 years for the hormone indication not birth control EMB collected. To notify patient of the results.   Dr. Arman Filter Tresa Res

## 2023-05-05 ENCOUNTER — Encounter: Payer: Self-pay | Admitting: Obstetrics and Gynecology

## 2023-05-05 ENCOUNTER — Other Ambulatory Visit (HOSPITAL_COMMUNITY)
Admission: RE | Admit: 2023-05-05 | Discharge: 2023-05-05 | Disposition: A | Discharge status: Home / Self Care | Source: Ambulatory Visit | Attending: Obstetrics and Gynecology | Admitting: Obstetrics and Gynecology

## 2023-05-05 ENCOUNTER — Ambulatory Visit (INDEPENDENT_AMBULATORY_CARE_PROVIDER_SITE_OTHER): Admitting: Obstetrics and Gynecology

## 2023-05-05 VITALS — BP 122/88 | HR 70

## 2023-05-05 DIAGNOSIS — N841 Polyp of cervix uteri: Secondary | ICD-10-CM | POA: Insufficient documentation

## 2023-05-05 DIAGNOSIS — Z30430 Encounter for insertion of intrauterine contraceptive device: Secondary | ICD-10-CM | POA: Diagnosis not present

## 2023-05-05 DIAGNOSIS — Z01812 Encounter for preprocedural laboratory examination: Secondary | ICD-10-CM

## 2023-05-05 DIAGNOSIS — N840 Polyp of corpus uteri: Secondary | ICD-10-CM | POA: Diagnosis not present

## 2023-05-05 HISTORY — DX: Encounter for insertion of intrauterine contraceptive device: Z30.430

## 2023-05-05 NOTE — Progress Notes (Signed)
   Shamekia Tippets [facility] [birth year] [id number]   History:  58 y.o. presents for insertion of mirena IUD.  Pt has been counseled about risks and benefits as well as complications.  Consent is obtained today. Using for endometrial protection in menopause  No LMP recorded. (Menstrual status: IUD).   Past medical history, past surgical history, family history and social history were all reviewed and documented in the EPIC chart.  ROS:  A ROS was performed and pertinent positives and negatives are included.  Exam: Vitals:   05/05/23 1526  BP: 122/88  Pulse: 70  SpO2: 98%   There is no height or weight on file to calculate BMI.  OBGyn Exam  sve: endocerival polyp again noted and attempt to remove was made.  Small portion likely still present Cervix cleaned with betadine. Mirena IUD brought to fundus and released. Mirena insertion device removed.  Strings trimmed to 2cm from cervical os. Patient tolerated the procedure well Assessment/Plan:  Insertion of mirena IUD             UPT neg    Pt aware to call for any concerns Pt aware removal due no later than 8, IUD card given to pt.   Earley Favor

## 2023-05-06 ENCOUNTER — Encounter: Payer: Self-pay | Admitting: Obstetrics and Gynecology

## 2023-05-06 LAB — SURGICAL PATHOLOGY

## 2023-05-07 ENCOUNTER — Encounter: Payer: Self-pay | Admitting: Obstetrics and Gynecology

## 2023-05-07 LAB — SURGICAL PATHOLOGY

## 2023-05-18 ENCOUNTER — Encounter: Payer: Self-pay | Admitting: Family Medicine

## 2023-05-19 ENCOUNTER — Ambulatory Visit: Payer: 59

## 2023-05-19 NOTE — Progress Notes (Signed)
 Patient presents today to pick up custom molded foot orthotics, diagnosed with capsulitis by Dr. Charlsie Merles.   Orthotics were dispensed and fit was satisfactory. Reviewed instructions for break-in and wear. Written instructions given to patient.  Patient will follow up as needed.  Addison Bailey CPed, CFo, CFm

## 2023-05-20 ENCOUNTER — Ambulatory Visit: Payer: 59 | Admitting: Bariatrics

## 2023-05-21 ENCOUNTER — Other Ambulatory Visit: Payer: Self-pay | Admitting: Family Medicine

## 2023-05-21 ENCOUNTER — Other Ambulatory Visit: Payer: Self-pay | Admitting: Bariatrics

## 2023-05-21 DIAGNOSIS — E1169 Type 2 diabetes mellitus with other specified complication: Secondary | ICD-10-CM

## 2023-05-22 NOTE — Telephone Encounter (Signed)
 Okay to refill?

## 2023-05-22 NOTE — Telephone Encounter (Signed)
 Medication has been sent over the pharmacy by Dr. Corinna Capra

## 2023-06-01 ENCOUNTER — Other Ambulatory Visit: Payer: Self-pay | Admitting: Bariatrics

## 2023-06-01 DIAGNOSIS — I10 Essential (primary) hypertension: Secondary | ICD-10-CM

## 2023-06-17 ENCOUNTER — Ambulatory Visit: Admitting: Bariatrics

## 2023-06-17 ENCOUNTER — Encounter: Payer: Self-pay | Admitting: Bariatrics

## 2023-06-17 VITALS — BP 126/73 | HR 52 | Temp 97.7°F | Ht 65.0 in | Wt 320.0 lb

## 2023-06-17 DIAGNOSIS — I10 Essential (primary) hypertension: Secondary | ICD-10-CM

## 2023-06-17 DIAGNOSIS — E66813 Obesity, class 3: Secondary | ICD-10-CM

## 2023-06-17 DIAGNOSIS — E119 Type 2 diabetes mellitus without complications: Secondary | ICD-10-CM

## 2023-06-17 DIAGNOSIS — Z7985 Long-term (current) use of injectable non-insulin antidiabetic drugs: Secondary | ICD-10-CM

## 2023-06-17 DIAGNOSIS — F5089 Other specified eating disorder: Secondary | ICD-10-CM

## 2023-06-17 DIAGNOSIS — Z7984 Long term (current) use of oral hypoglycemic drugs: Secondary | ICD-10-CM

## 2023-06-17 DIAGNOSIS — Z6841 Body Mass Index (BMI) 40.0 and over, adult: Secondary | ICD-10-CM

## 2023-06-17 MED ORDER — TIRZEPATIDE 5 MG/0.5ML ~~LOC~~ SOAJ: 5.0000 mg | SUBCUTANEOUS | 0 refills | Status: DC

## 2023-06-17 MED ORDER — BUPROPION HCL ER (SR) 150 MG PO TB12: 150.0000 mg | ORAL_TABLET | Freq: Every day | ORAL | 0 refills | Status: DC

## 2023-06-17 MED ORDER — METOPROLOL SUCCINATE ER 50 MG PO TB24: ORAL_TABLET | ORAL | 0 refills | Status: DC

## 2023-06-17 MED ORDER — LOSARTAN POTASSIUM 100 MG PO TABS: 100.0000 mg | ORAL_TABLET | Freq: Every day | ORAL | 0 refills | Status: DC

## 2023-06-17 NOTE — Progress Notes (Signed)
 WEIGHT SUMMARY AND BIOMETRICS  Weight Lost Since Last Visit: 0  Weight Gained Since Last Visit: 9lb   Vitals Temp: 97.7 F (36.5 C) BP: 126/73 Pulse Rate: (!) 52 SpO2: 99 %   Anthropometric Measurements Height: 5\' 5"  (1.651 m) Weight: (!) 320 lb (145.2 kg) BMI (Calculated): 53.25 Weight at Last Visit: 311lb Weight Lost Since Last Visit: 0 Weight Gained Since Last Visit: 9lb Starting Weight: 369lb Total Weight Loss (lbs): 49 lb (22.2 kg)   Body Composition  Body Fat %: 61 % Fat Mass (lbs): 195.4 lbs Muscle Mass (lbs): 118.6 lbs Visceral Fat Rating : 24   Other Clinical Data Fasting: no Labs: no Today's Visit #: 13 Starting Date: 06/11/22    OBESITY [name] is here to discuss her progress with her obesity treatment plan along with follow-up of her obesity related diagnoses.    Nutrition Plan: the Category 4 plan - 20% adherence.  Current exercise: walking  Interim History:  She is up 9 pounds since her last appointment.  She states that she has been on vacation and has not been as strict on her plan and in addition she has been off of her Mounjaro .  She did get orthotics in the meantime and is walking more. Eating all of the food on the plan., Protein intake is as prescribed, Is not skipping meals, Not journaling consistently., Not meeting calorie goals., and Water intake is adequate.   Pharmacotherapy: [name] is on she is on metformin  1000 mg 1 tablet twice daily with food, and also she has been on the Mounjaro  12.5 but has been off of the medication now for about 3 to 4 weeks. Adverse side effects: None Hunger is moderately controlled. Less controlled than with the Mounjaro . Cravings are moderately controlled.  Assessment/Plan:   Type II Diabetes HgbA1c is at goal. Last A1c was 5.7 Episodes of hypoglycemia: no Medication(s): Has been on  Mounjaro  and is on Metformin  1,000 mg BID.   Lab Results  Component Value Date   HGBA1C 5.7 (H) 12/09/2022   HGBA1C 6.7 (H) 06/11/2022   HGBA1C 6.7 (H) 08/22/2021   Lab Results  Component Value Date   MICROALBUR <0.7 04/12/2016   LDLCALC 62 12/09/2022   CREATININE 0.69 12/09/2022   Lab Results  Component Value Date   GFR 100.23 08/22/2021   GFR 100.80 04/07/2020   GFR 100.09 04/10/2017    Plan: Continue and decrease dose Mounjaro  5.0 mg SQ weekly.  She had been on a higher dose of Mounjaro  but has been off and now will go back to a lower dose and slowly taper as needed. Will keep all carbohydrates low both sweets and starches.  Will continue exercise regimen to 30 to 60 minutes on most days of the week.  Aim for 7 to 9 hours of sleep nightly.  Eat more low glycemic index foods.  Hypertension Hypertension well controlled.  Medication(s): Cozaar  100 mg once daily  and metoprolol  100 mg XL  daily   BP Readings from Last 3 Encounters:  06/17/23 126/73  05/05/23 122/88  05/02/23 118/82   Lab Results  Component Value Date   CREATININE 0.69 12/09/2022   CREATININE 0.61 06/11/2022   CREATININE 0.61 08/22/2021   Lab Results  Component Value Date   GFR 100.23 08/22/2021   GFR 100.80 04/07/2020   GFR 100.09 04/10/2017    Plan: Continue all antihypertensives at current dosages. No added salt. Will keep sodium content to 1,500 mg or less per day.   Will resume exercise and increase slowly over time.  Rx: Cozaar  100 mg 1 daily #90 with no refills Rx: Metoprolol  XL 50 mg 1 tablet daily with food #90 with no refills   Eating disorder/emotional eating [name] has had issues with stress eating, emotional eating, and boredom eating. Currently this is moderately controlled. Overall mood is stable. Denies suicidal/homicidal ideation. Medication(s): none  Plan:  Motivational interviewing as well as evidence-based interventions for health behavior change were utilized today  including the discussion of self monitoring techniques, problem-solving barriers and SMART goal setting techniques.  Discussed distractions to curb eating behaviors. Will choose healthy snacks. Will continue to cook more at home. Be sure to get adequate rest as lack of rest can trigger appetite.  Have plan in place for stressful events.  Consider other rewards besides food.    Begin: Wellbutrin  150 mg daily in the am    Morbid Obesity: Current BMI BMI (Calculated): 53.25   Pharmacotherapy Plan Continue and decrease dose  Mounjaro  5.0 mg SQ weekly (She has been off the higher dose of Mounjaro  now for approximately 3 to 4 weeks and will need to go to a lower dose and then taper up as needed.  Annecia is currently in the action stage of change. As such, her goal is to continue with weight loss efforts.  She has agreed to the Category 4 plan.  Exercise goals: All adults should avoid inactivity. Some physical activity is better than none, and adults who participate in any amount of physical activity gain some health benefits.  Behavioral modification strategies: increasing lean protein intake, no meal skipping, meal planning , increase water intake, better snacking choices, planning for success, increasing vegetables, and avoiding temptations.  Tamma has agreed to follow-up with our clinic in 4 weeks.     Objective:   VITALS: Per patient if applicable, see vitals. GENERAL: Alert and in no acute distress. CARDIOPULMONARY: No increased WOB. Speaking in clear sentences.  PSYCH: Pleasant and cooperative. Speech normal rate and rhythm. Affect is appropriate. Insight and judgement are appropriate. Attention is focused, linear, and appropriate.  NEURO: Oriented as arrived to appointment on time with no prompting.   Attestation Statements:  This was prepared with the assistance of Engineer, civil (consulting).  Occasional wrong-word or sound-a-like substitutions may have occurred due to the  inherent limitations of voice recognition   Wanda Peper, DO

## 2023-07-09 ENCOUNTER — Ambulatory Visit: Admitting: Bariatrics

## 2023-07-09 ENCOUNTER — Encounter: Payer: Self-pay | Admitting: Bariatrics

## 2023-07-09 VITALS — BP 140/82 | HR 62 | Temp 98.1°F | Ht 65.0 in | Wt 315.0 lb

## 2023-07-09 DIAGNOSIS — E119 Type 2 diabetes mellitus without complications: Secondary | ICD-10-CM | POA: Diagnosis not present

## 2023-07-09 DIAGNOSIS — F5089 Other specified eating disorder: Secondary | ICD-10-CM

## 2023-07-09 DIAGNOSIS — Z6841 Body Mass Index (BMI) 40.0 and over, adult: Secondary | ICD-10-CM

## 2023-07-09 DIAGNOSIS — Z7985 Long-term (current) use of injectable non-insulin antidiabetic drugs: Secondary | ICD-10-CM

## 2023-07-09 DIAGNOSIS — E66813 Obesity, class 3: Secondary | ICD-10-CM

## 2023-07-09 MED ORDER — TIRZEPATIDE 5 MG/0.5ML ~~LOC~~ SOAJ: 5.0000 mg | SUBCUTANEOUS | 0 refills | Status: DC

## 2023-07-09 MED ORDER — BUPROPION HCL ER (SR) 150 MG PO TB12: 150.0000 mg | ORAL_TABLET | Freq: Every day | ORAL | 0 refills | Status: DC

## 2023-07-09 NOTE — Progress Notes (Signed)
 WEIGHT SUMMARY AND BIOMETRICS  Weight Lost Since Last Visit: 5lb  Weight Gained Since Last Visit: 0   Vitals Temp: 98.1 F (36.7 C) BP: (!) 140/82 Pulse Rate: 62 SpO2: 98 %   Anthropometric Measurements Height: 5\' 5"  (1.651 m) Weight: (!) 315 lb (142.9 kg) BMI (Calculated): 52.42 Weight at Last Visit: 320lb Weight Lost Since Last Visit: 5lb Weight Gained Since Last Visit: 0 Starting Weight: 369lb Total Weight Loss (lbs): 54 lb (24.5 kg)   Body Composition  Body Fat %: 58.9 % Fat Mass (lbs): 185.8 lbs Muscle Mass (lbs): 123 lbs Visceral Fat Rating : 23   Other Clinical Data Fasting: no Labs: no Today's Visit #: 14 Starting Date: 06/11/22    OBESITY Gal is here to discuss her progress with her obesity treatment plan along with follow-up of her obesity related diagnoses.    Nutrition Plan: the Category 4 plan - 75% adherence.  Current exercise: walking  Interim History:  She is down 5 lbs since her last visit. She states that she did better in the last 2 weeks.  Eating all of the food on the plan., Protein intake is as prescribed, Is not skipping meals, Meeting protein goals., and Denies excessive cravings.   Pharmacotherapy: Kamyiah is on Mounjaro  5.0 mg SQ weekly Adverse side effects: None Hunger is moderately controlled.  Cravings are moderately controlled.  Assessment/Plan:   Type II Diabetes HgbA1c is at goal. Last A1c was 5.7 CBGs: Not checking      Episodes of hypoglycemia: no Medication(s): Mounjaro  5.0 mg SQ weekly  Lab Results  Component Value Date   HGBA1C 5.7 (H) 12/09/2022   HGBA1C 6.7 (H) 06/11/2022   HGBA1C 6.7 (H) 08/22/2021   Lab Results  Component Value Date   MICROALBUR <0.7 04/12/2016   LDLCALC 62 12/09/2022   CREATININE 0.69 12/09/2022   Lab Results  Component Value Date   GFR 100.23 08/22/2021   GFR  100.80 04/07/2020   GFR 100.09 04/10/2017    Plan: Continue and refill Mounjaro  5.0 mg SQ weekly Will keep all carbohydrates low both sweets and starches.  Will continue exercise regimen to 30 to 60 minutes on most days of the week.  Aim for 7 to 9 hours of sleep nightly.  Eat more low glycemic index foods.   Eating disorder/emotional eating Corrissa has had issues with stress eating, emotional eating, and boredom eating. Currently this is moderately controlled. Overall mood is stable. Denies suicidal/homicidal ideation. Medication(s): Wellbutrin  150 mg daily in the am  Plan:  Specifically regarding patient's less desirable eating habits and patterns, we employed the technique of small changes when she cannot fully commit to her prudent nutritional plan. Consider a referral to Dr. Delaine Favorite, PhD psychologist to help with eating behaviors.  Discussed distractions to curb eating behaviors. Discussed activities to do with one's hands in the evening  Be  sure to get adequate rest as lack of rest can trigger appetite.  Have plan in place for stressful events.  Consider other rewards besides food.      Morbid Obesity: Current BMI BMI (Calculated): 52.42   Pharmacotherapy Plan Continue and refill  Mounjaro  5.0 mg SQ weekly  Anisten is currently in the action stage of change. As such, her goal is to continue with weight loss efforts.  She has agreed to the Category 4 plan.  Exercise goals: No exercise has been prescribed at this time. All adults should avoid inactivity. Some physical activity is better than none, and adults who participate in any amount of physical activity gain some health benefits.  Behavioral modification strategies: increasing lean protein intake, meal planning , better snacking choices, planning for success, avoiding temptations, keep healthy foods in the home, weigh protein portions, and mindful eating.  Tammi has agreed to follow-up with our clinic in 4 weeks.      Objective:   VITALS: Per patient if applicable, see vitals. GENERAL: Alert and in no acute distress. CARDIOPULMONARY: No increased WOB. Speaking in clear sentences.  PSYCH: Pleasant and cooperative. Speech normal rate and rhythm. Affect is appropriate. Insight and judgement are appropriate. Attention is focused, linear, and appropriate.  NEURO: Oriented as arrived to appointment on time with no prompting.   Attestation Statements:   This was prepared with the assistance of Engineer, civil (consulting).  Occasional wrong-word or sound-a-like substitutions may have occurred due to the inherent limitations of voice recognition   Kirk Peper, DO

## 2023-07-12 ENCOUNTER — Encounter: Payer: Self-pay | Admitting: Bariatrics

## 2023-07-14 ENCOUNTER — Other Ambulatory Visit: Payer: Self-pay | Admitting: Bariatrics

## 2023-07-15 ENCOUNTER — Other Ambulatory Visit: Payer: Self-pay | Admitting: Bariatrics

## 2023-07-16 ENCOUNTER — Other Ambulatory Visit (INDEPENDENT_AMBULATORY_CARE_PROVIDER_SITE_OTHER): Payer: Self-pay

## 2023-07-16 DIAGNOSIS — E119 Type 2 diabetes mellitus without complications: Secondary | ICD-10-CM

## 2023-07-16 MED ORDER — TIRZEPATIDE 5 MG/0.5ML ~~LOC~~ SOAJ
5.0000 mg | SUBCUTANEOUS | 0 refills | Status: DC
Start: 2023-07-16 — End: 2023-09-03

## 2023-07-20 ENCOUNTER — Other Ambulatory Visit: Payer: Self-pay | Admitting: Family Medicine

## 2023-07-21 ENCOUNTER — Telehealth: Payer: Self-pay | Admitting: Family Medicine

## 2023-07-21 NOTE — Telephone Encounter (Signed)
 Patient is wanting to change providers, per Dr. Arliss Lam last OV note patient need a follow-up for DM

## 2023-07-21 NOTE — Telephone Encounter (Signed)
 Lmom for pt to callback and sch 4 month office visit

## 2023-08-05 ENCOUNTER — Other Ambulatory Visit: Payer: Self-pay | Admitting: Bariatrics

## 2023-08-07 ENCOUNTER — Ambulatory Visit: Admitting: Bariatrics

## 2023-08-15 ENCOUNTER — Other Ambulatory Visit: Payer: Self-pay | Admitting: Bariatrics

## 2023-08-15 DIAGNOSIS — E1169 Type 2 diabetes mellitus with other specified complication: Secondary | ICD-10-CM

## 2023-08-28 NOTE — Telephone Encounter (Signed)
 Ok with me

## 2023-08-28 NOTE — Telephone Encounter (Signed)
 Pt came in thinking her TOC appt to Dr. Ozell was set for today however we don't have any record of her sched an appt nor got a yes from the provider.

## 2023-08-28 NOTE — Telephone Encounter (Signed)
 If it is okay with Dr. Ozell I will call the pt back to get them sched as soon as possible.   Please advise.

## 2023-09-01 ENCOUNTER — Other Ambulatory Visit: Payer: Self-pay | Admitting: Family Medicine

## 2023-09-01 ENCOUNTER — Encounter: Admitting: Family Medicine

## 2023-09-01 DIAGNOSIS — E1169 Type 2 diabetes mellitus with other specified complication: Secondary | ICD-10-CM

## 2023-09-03 ENCOUNTER — Ambulatory Visit (INDEPENDENT_AMBULATORY_CARE_PROVIDER_SITE_OTHER): Admitting: Bariatrics

## 2023-09-03 ENCOUNTER — Encounter: Payer: Self-pay | Admitting: Bariatrics

## 2023-09-03 VITALS — BP 136/73 | HR 54 | Temp 97.5°F | Ht 65.0 in | Wt 322.0 lb

## 2023-09-03 DIAGNOSIS — Z6841 Body Mass Index (BMI) 40.0 and over, adult: Secondary | ICD-10-CM

## 2023-09-03 DIAGNOSIS — E785 Hyperlipidemia, unspecified: Secondary | ICD-10-CM | POA: Diagnosis not present

## 2023-09-03 DIAGNOSIS — Z7985 Long-term (current) use of injectable non-insulin antidiabetic drugs: Secondary | ICD-10-CM

## 2023-09-03 DIAGNOSIS — I10 Essential (primary) hypertension: Secondary | ICD-10-CM | POA: Diagnosis not present

## 2023-09-03 DIAGNOSIS — E559 Vitamin D deficiency, unspecified: Secondary | ICD-10-CM

## 2023-09-03 DIAGNOSIS — E119 Type 2 diabetes mellitus without complications: Secondary | ICD-10-CM | POA: Diagnosis not present

## 2023-09-03 DIAGNOSIS — E66813 Obesity, class 3: Secondary | ICD-10-CM

## 2023-09-03 DIAGNOSIS — E538 Deficiency of other specified B group vitamins: Secondary | ICD-10-CM | POA: Diagnosis not present

## 2023-09-03 DIAGNOSIS — E1169 Type 2 diabetes mellitus with other specified complication: Secondary | ICD-10-CM

## 2023-09-03 MED ORDER — ATORVASTATIN CALCIUM 20 MG PO TABS: 20.0000 mg | ORAL_TABLET | Freq: Every day | ORAL | 0 refills | Status: DC

## 2023-09-03 MED ORDER — BUPROPION HCL ER (SR) 150 MG PO TB12: 150.0000 mg | ORAL_TABLET | Freq: Every day | ORAL | 0 refills | Status: DC

## 2023-09-03 MED ORDER — TIRZEPATIDE 5 MG/0.5ML ~~LOC~~ SOAJ: 5.0000 mg | SUBCUTANEOUS | 0 refills | Status: DC

## 2023-09-03 NOTE — Progress Notes (Signed)
 WEIGHT SUMMARY AND BIOMETRICS  Weight Lost Since Last Visit: 0  Weight Gained Since Last Visit: 7lb   Vitals Temp: (!) 97.5 F (36.4 C) BP: 136/73 Pulse Rate: (!) 54 SpO2: 99 %   Anthropometric Measurements Height: 5' 5 (1.651 m) Weight: (!) 322 lb (146.1 kg) BMI (Calculated): 53.58 Weight at Last Visit: 315lb Weight Lost Since Last Visit: 0 Weight Gained Since Last Visit: 7lb Starting Weight: 369lb Total Weight Loss (lbs): 47 lb (21.3 kg)   Body Composition  Body Fat %: 59.5 % Fat Mass (lbs): 192 lbs Muscle Mass (lbs): 124 lbs Visceral Fat Rating : 24   Other Clinical Data Fasting: no Labs: no Today's Visit #: 15 Starting Date: 06/11/22    OBESITY Taura is here to discuss her progress with her obesity treatment plan along with follow-up of her obesity related diagnoses.    Nutrition Plan: the Category 4 plan - 30% adherence.  Current exercise: walking  Interim History:  She is up 7 lbs since her last visit.  Eating all of the food on the plan., Protein intake is as prescribed, Is not skipping meals, Water intake is adequate., and Reports polyphagia   Pharmacotherapy: [place] is on Mounjaro  5.0 mg SQ weekly Adverse side effects: None Hunger is moderately controlled.  Cravings are moderately controlled.  Assessment/Plan:   Type II Diabetes HgbA1c is at goal. Last A1c was 5.7 CBGs: Not checking      Episodes of hypoglycemia: no Medication(s): Mounjaro  5.0 mg SQ weekly  Lab Results  Component Value Date   HGBA1C 5.7 (H) 12/09/2022   HGBA1C 6.7 (H) 06/11/2022   HGBA1C 6.7 (H) 08/22/2021   Lab Results  Component Value Date   LDLCALC 62 12/09/2022   CREATININE 0.69 12/09/2022   Lab Results  Component Value Date   GFR 100.23 08/22/2021   GFR 100.80 04/07/2020   GFR 100.09 04/10/2017    Plan: Continue and refill Wegovy 0.50  mg SQ weekly Continue all other medications.  Will keep all carbohydrates low both sweets and starches.  Will continue exercise regimen to 30 to 60 minutes on most days of the week.  Aim for 7 to 9 hours of sleep nightly.  Eat more low glycemic index foods.   Hyperlipidemia LDL is at goal. Medication(s): Lipitor Cardiovascular risk factors: diabetes mellitus, dyslipidemia, hypertension, obesity (BMI >= 30 kg/m2), and sedentary lifestyle  Lab Results  Component Value Date   CHOL 134 12/09/2022   HDL 56 12/09/2022   LDLCALC 62 12/09/2022   TRIG 82 12/09/2022   CHOLHDL 2 08/22/2021   Lab Results  Component Value Date   ALT 35 (H) 12/09/2022   AST 24 12/09/2022   ALKPHOS 128 (H) 12/09/2022   BILITOT 0.5 12/09/2022   The 10-year ASCVD risk score (Arnett DK, et al., 2019) is: 5.4%   Values used to calculate the score:  Age: 38 years     Clincally relevant sex: Female     Is Non-Hispanic African American: No     Diabetic: Yes     Tobacco smoker: No     Systolic Blood Pressure: 136 mmHg     Is BP treated: Yes     HDL Cholesterol: 56 mg/dL     Total Cholesterol: 134 mg/dL  Plan:  Continue statin.  Rx: Lipitor 20 mg daily #90 with no refills.  Patient will get subsequent refills per her PCP she currently is in the process of getting a new PCP.  Will avoid all trans fats.  Will read labels Will minimize saturated fats except the following: low fat meats in moderation, diary, and limited dark chocolate.  Increase Omega 3 in foods, and consider an Omega 3 supplement.     Hypertension Hypertension stable.  Medication(s): Cozaar  100 mg once daily   BP Readings from Last 3 Encounters:  09/03/23 136/73  07/09/23 (!) 140/82  06/17/23 126/73   Lab Results  Component Value Date   CREATININE 0.69 12/09/2022   CREATININE 0.61 06/11/2022   CREATININE 0.61 08/22/2021   Lab Results  Component Value Date   GFR 100.23 08/22/2021   GFR 100.80 04/07/2020   GFR 100.09  04/10/2017    Plan: Continue all antihypertensives at current dosages. No added salt. Will keep sodium content to 1,500 mg or less per day.    Vitamin D  Deficiency Vitamin D  is at goal of 50.  Most recent vitamin D  level was 71.2. She is on  prescription ergocalciferol  50,000 IU weekly. Lab Results  Component Value Date   VD25OH 71.2 12/09/2022   VD25OH 64.2 06/11/2022   VD25OH 53.54 08/22/2021    Plan: Continue prescription vitamin D  50,000 IU weekly.    B 12 deficiency:   She has a history of B12 deficiency.  She is currently taking B12 daily.  Plan:  Check B 12 lab today.  Continue B 12.    Labs done today (CMP, Lipids, HgbA1c, insulin , vitamin D , B 12).    Morbid Obesity: Current BMI BMI (Calculated): 53.58   Pharmacotherapy Plan Continue and refill  Mounjaro  5.0 mg SQ weekly  [place] is currently in the action stage of change. As such, her goal is to continue with weight loss efforts.  She has agreed to the Category 4 plan.  Exercise goals: All adults should avoid inactivity. Some physical activity is better than none, and adults who participate in any amount of physical activity gain some health benefits.  Behavioral modification strategies: increasing lean protein intake, no meal skipping, decrease eating out, meal planning , increase water intake, better snacking choices, planning for success, increasing vegetables, increasing fiber rich foods, decrease snacking , avoiding temptations, keep healthy foods in the home, weigh protein portions, measure portion sizes, work on smaller portions, and mindful eating.  Dalis has agreed to follow-up with our clinic in 4 weeks.     Objective:   VITALS: Per patient if applicable, see vitals. GENERAL: Alert and in no acute distress. CARDIOPULMONARY: No increased WOB. Speaking in clear sentences.  PSYCH: Pleasant and cooperative. Speech normal rate and rhythm. Affect is appropriate. Insight and judgement are appropriate.  Attention is focused, linear, and appropriate.  NEURO: Oriented as arrived to appointment on time with no prompting.   Attestation Statements:   This was prepared with the assistance of Engineer, civil (consulting).  Occasional wrong-word or sound-a-like substitutions may have occurred due to the inherent limitations of  voice recognition   Clayborne Daring, DO

## 2023-09-04 LAB — COMPREHENSIVE METABOLIC PANEL WITH GFR
ALT: 34 IU/L — ABNORMAL HIGH (ref 0–32)
AST: 18 IU/L (ref 0–40)
Albumin: 4.4 g/dL (ref 3.8–4.9)
Alkaline Phosphatase: 144 IU/L — ABNORMAL HIGH (ref 44–121)
BUN/Creatinine Ratio: 26 — ABNORMAL HIGH (ref 9–23)
BUN: 16 mg/dL (ref 6–24)
Bilirubin Total: 0.4 mg/dL (ref 0.0–1.2)
CO2: 24 mmol/L (ref 20–29)
Calcium: 9.6 mg/dL (ref 8.7–10.2)
Chloride: 101 mmol/L (ref 96–106)
Creatinine, Ser: 0.61 mg/dL (ref 0.57–1.00)
Globulin, Total: 2.9 g/dL (ref 1.5–4.5)
Glucose: 85 mg/dL (ref 70–99)
Potassium: 4.3 mmol/L (ref 3.5–5.2)
Sodium: 142 mmol/L (ref 134–144)
Total Protein: 7.3 g/dL (ref 6.0–8.5)
eGFR: 104 mL/min/1.73 (ref >59)

## 2023-09-04 LAB — INSULIN, RANDOM: INSULIN: 8.3 u[IU]/mL (ref 2.6–24.9)

## 2023-09-04 LAB — LIPID PANEL WITH LDL/HDL RATIO
Cholesterol, Total: 195 mg/dL (ref 100–199)
HDL: 63 mg/dL (ref >39)
LDL Chol Calc (NIH): 115 mg/dL — ABNORMAL HIGH (ref 0–99)
LDL/HDL Ratio: 1.8 ratio (ref 0.0–3.2)
Triglycerides: 96 mg/dL (ref 0–149)
VLDL Cholesterol Cal: 17 mg/dL (ref 5–40)

## 2023-09-04 LAB — VITAMIN B12: Vitamin B-12: 740 pg/mL (ref 232–1245)

## 2023-09-04 LAB — VITAMIN D 25 HYDROXY (VIT D DEFICIENCY, FRACTURES): Vit D, 25-Hydroxy: 58.6 ng/mL (ref 30.0–100.0)

## 2023-09-04 LAB — HEMOGLOBIN A1C
Est. average glucose Bld gHb Est-mCnc: 117 mg/dL
Hgb A1c MFr Bld: 5.7 % — ABNORMAL HIGH (ref 4.8–5.6)

## 2023-09-09 ENCOUNTER — Encounter: Payer: Self-pay | Admitting: Family Medicine

## 2023-09-09 ENCOUNTER — Ambulatory Visit (INDEPENDENT_AMBULATORY_CARE_PROVIDER_SITE_OTHER): Admitting: Family Medicine

## 2023-09-09 VITALS — BP 128/70 | HR 55 | Temp 97.7°F | Ht 65.0 in | Wt 323.3 lb

## 2023-09-09 DIAGNOSIS — E119 Type 2 diabetes mellitus without complications: Secondary | ICD-10-CM

## 2023-09-09 DIAGNOSIS — I10 Essential (primary) hypertension: Secondary | ICD-10-CM

## 2023-09-09 DIAGNOSIS — Z7984 Long term (current) use of oral hypoglycemic drugs: Secondary | ICD-10-CM

## 2023-09-09 MED ORDER — METOPROLOL SUCCINATE ER 50 MG PO TB24: ORAL_TABLET | ORAL | 1 refills | Status: DC

## 2023-09-09 MED ORDER — LOSARTAN POTASSIUM 100 MG PO TABS: 100.0000 mg | ORAL_TABLET | Freq: Every day | ORAL | 1 refills | Status: DC

## 2023-09-09 NOTE — Patient Instructions (Addendum)
 Fiber-- 35 grams per day  1 scoop miralax per day  Probiotics or active culture milk or yogurt  Send me a mychart message when you need the 7.5 mg mounjaro  called in and I will call it in for you.

## 2023-09-09 NOTE — Progress Notes (Unsigned)
 Established Patient Office Visit  Subjective   Patient ID: [name], female    DOB: September 30, 1965  Age: 58 y.o. MRN: [id number]  Chief Complaint  Patient presents with  . Establish Care    Patient presents for [facility] from Dr Mercer    Pt is here to transition her care. Last seen by her previous PCP in January. 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 States that she is not having vertigo, maybe occasionally will get dizzy when she stands up too fast but otherwise no other symptoms. No chronic headaches or migraines. States that some days are better than others. States that she had her hearing checked in office about 2 years ago, was told her hearing was fine at the time. States she has gotten used to it.  Patient has a history of DM, HTN, HLD. States that she is doing well on the metformin  and the mounjaro . She had a pause     Current Outpatient Medications  Medication Instructions  . atorvastatin  (LIPITOR) 20 mg, Oral, Daily  . blood glucose meter kit and supplies Dispense based on patient and insurance preference. Use up to twice daily as directed. (FOR ICD-10 E10.9, E11.9).  . buPROPion  (WELLBUTRIN  SR) 150 mg, Oral, Daily  . cetirizine (ZYRTEC) 10 MG tablet No dose, route, or frequency recorded.  . Cyanocobalamin (VITAMIN B 12 PO) No dose, route, or frequency recorded.  . fluticasone  (FLONASE ) 50 MCG/ACT nasal spray As needed  . Glucosamine-Chondroitin-Vit D3 1500-1200-800 MG-MG-UNIT PACK No dose, route, or frequency recorded.  . levonorgestrel  (MIRENA ) 20 MCG/DAY IUD 1 each,  Once  . losartan  (COZAAR ) 100 mg, Oral, Daily  . MAGNESIUM PO Take by mouth.  . metFORMIN  (GLUCOPHAGE ) 1000 MG tablet TAKE 1 TABLET (1,000 MG TOTAL) BY MOUTH TWICE A DAY WITH FOOD  . metoprolol  succinate (TOPROL -XL) 50 MG 24 hr tablet TAKE ONE TABLET BY MOUTH ONCE DAILY WITH FOOD  . tirzepatide  (MOUNJARO ) 5 mg, Subcutaneous, Weekly  . Vitamin D ,  Cholecalciferol, 50 MCG (2000 UT) CAPS 1 capsule, Daily    Patient Active Problem List   Diagnosis Date Noted  . BMI 50.0-59.9, adult (HCC) 07/17/2022  . Morbid obesity (HCC) 06/25/2022  . BMI 60.0-69.9, adult (HCC) 06/25/2022  . Elevated ALT measurement 06/17/2022  . Insulin  resistance 06/17/2022  . Acute on chronic diastolic HF (heart failure) (HCC) 12/31/2018  . Depression 11/09/2018  . Elevated alkaline phosphatase level 03/04/2018  . Hyperlipidemia associated with type 2 diabetes mellitus (HCC) 03/01/2018  . Type 2 diabetes mellitus without complication, without long-term current use of insulin  (HCC) 03/01/2018  . Depression, recurrent (HCC) 03/01/2018  . Vitamin D  deficiency 02/16/2018  . Class 3 severe obesity with serious comorbidity and body mass index (BMI) of 45.0 to 49.9 in adult 02/16/2018  . Sleep apnea 05/13/2017  . CHF (congestive heart failure) (HCC) 04/03/2017  . Heart murmur 08/28/2015  . Chronic venous insufficiency 11/09/2014  . Essential hypertension 11/09/2014  . GERD (gastroesophageal reflux disease) 07/28/2014  . Seasonal allergies 07/28/2014      Review of Systems  All other systems reviewed and are negative.     Objective:     BP 128/70   Pulse (!) 55   Temp 97.7 F (36.5 C) (Oral)   Ht 5' 5 (1.651 m)   Wt (!) 323 lb 4.8 oz (146.6 kg)   SpO2 99%   BMI 53.80 kg/m  {Vitals History (Optional):23777}  Physical Exam Vitals  reviewed.  Constitutional:      Appearance: Normal appearance. She is morbidly obese.  Cardiovascular:     Rate and Rhythm: Normal rate and regular rhythm.     Heart sounds: Normal heart sounds.  Pulmonary:     Effort: Pulmonary effort is normal.     Breath sounds: Normal breath sounds. No wheezing.  Neurological:     Mental Status: She is alert and oriented to person, place, and time. Mental status is at baseline.  Psychiatric:        Mood and Affect: Mood normal.        Behavior: Behavior normal.      No  results found for any visits on 09/09/23.  {Labs (Optional):23779}  The 10-year ASCVD risk score (Arnett DK, et al., 2019) is: 6.1%    Assessment & Plan:  Essential hypertension -     Losartan  Potassium; Take 1 tablet (100 mg total) by mouth daily.  Dispense: 90 tablet; Refill: 1 -     Metoprolol  Succinate ER; TAKE ONE TABLET BY MOUTH ONCE DAILY WITH FOOD  Dispense: 90 tablet; Refill: 1     Return in about 3 months (around 12/10/2023) for weight loss.    Heron CHRISTELLA Sharper, MD

## 2023-09-11 ENCOUNTER — Ambulatory Visit: Admitting: Family Medicine

## 2023-09-11 NOTE — Assessment & Plan Note (Addendum)
 Chronic ,stable, last A1C was 5.7 which is very well controlled. On metformin  and mounjaro , I advised we continue to increase the dose until the maximum she can tolerate due to the benefits of the weight loss. I counseled the patietn on diet and exercise which she is already engaged in. Will see her back in 3 months for weight check and follow up A1C

## 2023-09-11 NOTE — Assessment & Plan Note (Signed)
 Current hypertension medications:       Sig   losartan  (COZAAR ) 100 MG tablet Take 1 tablet (100 mg total) by mouth daily.   metoprolol  succinate (TOPROL -XL) 50 MG 24 hr tablet TAKE ONE TABLET BY MOUTH ONCE DAILY WITH FOOD      Chronic, stable, BP well cotnrolled on the above medications, will continue as prescribed.

## 2023-09-26 ENCOUNTER — Encounter: Payer: Self-pay | Admitting: Family Medicine

## 2023-09-26 DIAGNOSIS — F339 Major depressive disorder, recurrent, unspecified: Secondary | ICD-10-CM

## 2023-09-26 DIAGNOSIS — E119 Type 2 diabetes mellitus without complications: Secondary | ICD-10-CM

## 2023-09-26 MED ORDER — TIRZEPATIDE 7.5 MG/0.5ML ~~LOC~~ SOAJ: 7.5000 mg | SUBCUTANEOUS | 2 refills | Status: DC

## 2023-09-29 MED ORDER — BUPROPION HCL ER (SR) 150 MG PO TB12: 150.0000 mg | ORAL_TABLET | Freq: Every day | ORAL | 0 refills | Status: DC

## 2023-10-02 ENCOUNTER — Encounter: Admitting: Family Medicine

## 2023-10-07 ENCOUNTER — Ambulatory Visit: Admitting: Bariatrics

## 2023-10-18 ENCOUNTER — Telehealth

## 2023-10-18 DIAGNOSIS — J029 Acute pharyngitis, unspecified: Secondary | ICD-10-CM | POA: Diagnosis not present

## 2023-10-18 DIAGNOSIS — Z20822 Contact with and (suspected) exposure to covid-19: Secondary | ICD-10-CM | POA: Diagnosis not present

## 2023-10-19 MED ORDER — AMOXICILLIN 500 MG PO CAPS: 500.0000 mg | ORAL_CAPSULE | Freq: Two times a day (BID) | ORAL | 0 refills | Status: AC

## 2023-10-19 NOTE — Progress Notes (Signed)
Approximately 5 minutes was spent documenting and reviewing patient's chart.

## 2023-10-19 NOTE — Progress Notes (Signed)
 Hello, Given your current symptoms, I recommend you take a COVID test to rule out. You may have COVID or pharyngitis?   Bari Learn, FNP  Approximately 5 minutes was spent documenting and reviewing patient's chart.

## 2023-10-19 NOTE — Addendum Note (Signed)
 Addended by: LAVELL LYE A on: 10/19/2023 02:11 PM   Modules accepted: Orders

## 2023-10-19 NOTE — Progress Notes (Signed)
 E-Visit for Sore Throat - Strep Symptoms  We are sorry that you are not feeling well.  Here is how we plan to help!  Based on what you have shared with me it is likely that you have strep pharyngitis.  Strep pharyngitis is inflammation and infection in the back of the throat.  This is an infection cause by bacteria and is treated with antibiotics.  I have prescribed Amoxicillin 500 mg twice a day for 10 days. For throat pain, we recommend over the counter oral pain relief medications such as acetaminophen or aspirin, or anti-inflammatory medications such as ibuprofen or naproxen sodium. Topical treatments such as oral throat lozenges or sprays may be used as needed. Strep infections are not as easily transmitted as other respiratory infections, however we still recommend that you avoid close contact with loved ones, especially the very young and elderly.  Remember to wash your hands thoroughly throughout the day as this is the number one way to prevent the spread of infection and wipe down door knobs and counters with disinfectant.   Home Care: Only take medications as instructed by your medical team. Complete the entire course of an antibiotic. Do not take these medications with alcohol. A steam or ultrasonic humidifier can help congestion.  You can place a towel over your head and breathe in the steam from hot water coming from a faucet. Avoid close contacts especially the very young and the elderly. Cover your mouth when you cough or sneeze. Always remember to wash your hands.  Get Help Right Away If: You develop worsening fever or sinus pain. You develop a severe head ache or visual changes. Your symptoms persist after you have completed your treatment plan.  Make sure you Understand these instructions. Will watch your condition. Will get help right away if you are not doing well or get worse.   Thank you for choosing an e-visit.  Your e-visit answers were reviewed by a board  certified advanced clinical practitioner to complete your personal care plan. Depending upon the condition, your plan could have included both over the counter or prescription medications.  Please review your pharmacy choice. Make sure the pharmacy is open so you can pick up prescription now. If there is a problem, you may contact your provider through [employer] and have the prescription routed to another pharmacy.  Your safety is important to [place]. If you have drug allergies check your prescription carefully.   For the next 24 hours you can use MyChart to ask questions about today's visit, request a non-urgent call back, or ask for a work or school excuse. You will get an email in the next two days asking about your experience. I hope that your e-visit has been valuable and will speed your recovery.

## 2023-11-04 ENCOUNTER — Ambulatory Visit: Admitting: Bariatrics

## 2023-11-27 ENCOUNTER — Encounter: Payer: Self-pay | Admitting: Family Medicine

## 2023-11-28 ENCOUNTER — Other Ambulatory Visit: Payer: Self-pay | Admitting: Bariatrics

## 2023-11-28 DIAGNOSIS — I10 Essential (primary) hypertension: Secondary | ICD-10-CM

## 2023-11-28 DIAGNOSIS — E1169 Type 2 diabetes mellitus with other specified complication: Secondary | ICD-10-CM

## 2023-11-28 MED ORDER — ATORVASTATIN CALCIUM 20 MG PO TABS
20.0000 mg | ORAL_TABLET | Freq: Every day | ORAL | 0 refills | Status: DC
Start: 2023-11-28 — End: 2023-12-11

## 2023-11-28 NOTE — Telephone Encounter (Signed)
[facility] to refills. 

## 2023-12-02 ENCOUNTER — Ambulatory Visit: Admitting: Bariatrics

## 2023-12-11 ENCOUNTER — Ambulatory Visit: Admitting: Family Medicine

## 2023-12-11 ENCOUNTER — Encounter: Payer: Self-pay | Admitting: Family Medicine

## 2023-12-11 DIAGNOSIS — F339 Major depressive disorder, recurrent, unspecified: Secondary | ICD-10-CM | POA: Diagnosis not present

## 2023-12-11 DIAGNOSIS — Z7985 Long-term (current) use of injectable non-insulin antidiabetic drugs: Secondary | ICD-10-CM

## 2023-12-11 DIAGNOSIS — E119 Type 2 diabetes mellitus without complications: Secondary | ICD-10-CM | POA: Diagnosis not present

## 2023-12-11 LAB — MICROALBUMIN / CREATININE URINE RATIO
Creatinine,U: 132.3 mg/dL
Microalb Creat Ratio: 10.2 mg/g (ref 0.0–30.0)
Microalb, Ur: 1.3 mg/dL (ref 0.0–1.9)

## 2023-12-11 MED ORDER — TIRZEPATIDE 10 MG/0.5ML ~~LOC~~ SOAJ: 10.0000 mg | SUBCUTANEOUS | 2 refills | Status: DC

## 2023-12-11 MED ORDER — BUPROPION HCL ER (SR) 150 MG PO TB12: 150.0000 mg | ORAL_TABLET | Freq: Every day | ORAL | 1 refills | Status: AC

## 2023-12-11 NOTE — Progress Notes (Unsigned)
   Established Patient Office Visit  Subjective   Patient ID: [name], female    DOB: [birth year]  Age: 58 y.o. MRN: [id number]  Chief Complaint  Patient presents with  . Medical Management of Chronic Issues    HPI   Current Outpatient Medications  Medication Instructions  . atorvastatin  (LIPITOR) 20 mg, Oral, Daily  . buPROPion  (WELLBUTRIN  SR) 150 mg, Oral, Daily  . cetirizine (ZYRTEC) 10 MG tablet No dose, route, or frequency recorded.  . Cyanocobalamin (VITAMIN B 12 PO) No dose, route, or frequency recorded.  . fluticasone  (FLONASE ) 50 MCG/ACT nasal spray As needed  . Glucosamine-Chondroitin-Vit D3 1500-1200-800 MG-MG-UNIT PACK No dose, route, or frequency recorded.  . levonorgestrel  (MIRENA ) 20 MCG/DAY IUD 1 each,  Once  . losartan  (COZAAR ) 100 mg, Oral, Daily  . MAGNESIUM PO Take by mouth.  . metFORMIN  (GLUCOPHAGE ) 1000 MG tablet TAKE 1 TABLET (1,000 MG TOTAL) BY MOUTH TWICE A DAY WITH FOOD  . metoprolol  succinate (TOPROL -XL) 50 MG 24 hr tablet TAKE ONE TABLET BY MOUTH ONCE DAILY WITH FOOD  . tirzepatide  (MOUNJARO ) 10 mg, Subcutaneous, Weekly  . Vitamin D , Cholecalciferol, 50 MCG (2000 UT) CAPS 1 capsule, Daily    Patient Active Problem List   Diagnosis Date Noted  . BMI 50.0-59.9, adult (HCC) 07/17/2022  . Morbid obesity (HCC) 06/25/2022  . BMI 60.0-69.9, adult (HCC) 06/25/2022  . Elevated ALT measurement 06/17/2022  . Insulin  resistance 06/17/2022  . Acute on chronic diastolic HF (heart failure) (HCC) 12/31/2018  . Depression 11/09/2018  . Elevated alkaline phosphatase level 03/04/2018  . Hyperlipidemia associated with type 2 diabetes mellitus (HCC) 03/01/2018  . Type 2 diabetes mellitus without complication, without long-term current use of insulin  (HCC) 03/01/2018  . Depression, recurrent 03/01/2018  . Vitamin D  deficiency 02/16/2018  . Class 3 severe obesity with serious comorbidity and body mass index (BMI) of 45.0 to 49.9 in adult (HCC) 02/16/2018   . Sleep apnea 05/13/2017  . CHF (congestive heart failure) (HCC) 04/03/2017  . Heart murmur 08/28/2015  . Chronic venous insufficiency 11/09/2014  . Essential hypertension 11/09/2014  . GERD (gastroesophageal reflux disease) 07/28/2014  . Seasonal allergies 07/28/2014     Review of Systems  All other systems reviewed and are negative.     Objective:     BP 130/78   Pulse (!) 55   Temp 98.2 F (36.8 C) (Oral)   Ht 5' 5 (1.651 m)   Wt (!) 313 lb 11.2 oz (142.3 kg)   SpO2 99%   BMI 52.20 kg/m  {Vitals History (Optional):23777}  Physical Exam   No results found for any visits on 12/11/23.  {Labs (Optional):23779}  The 10-year ASCVD risk score (Arnett DK, et al., 2019) is: 6.2%    Assessment & Plan:  Type 2 diabetes mellitus without complication, without long-term current use of insulin  (HCC) -     Tirzepatide ; Inject 10 mg into the skin once a week.  Dispense: 2 mL; Refill: 2 -     Microalbumin / creatinine urine ratio; Future  Depression, recurrent -     buPROPion  HCl ER (SR); Take 1 tablet (150 mg total) by mouth daily.  Dispense: 90 tablet; Refill: 1     No follow-ups on file.    Heron CHRISTELLA Sharper, MD

## 2023-12-12 ENCOUNTER — Ambulatory Visit: Payer: Self-pay | Admitting: Family Medicine

## 2023-12-30 ENCOUNTER — Ambulatory Visit: Admitting: Bariatrics

## 2024-01-11 ENCOUNTER — Other Ambulatory Visit: Payer: Self-pay | Admitting: Family Medicine

## 2024-01-14 ENCOUNTER — Other Ambulatory Visit: Payer: Self-pay | Admitting: Nurse Practitioner

## 2024-01-14 DIAGNOSIS — E119 Type 2 diabetes mellitus without complications: Secondary | ICD-10-CM

## 2024-01-27 ENCOUNTER — Ambulatory Visit: Admitting: Bariatrics

## 2024-02-03 ENCOUNTER — Other Ambulatory Visit (HOSPITAL_COMMUNITY): Payer: Self-pay | Admitting: Family Medicine

## 2024-02-03 DIAGNOSIS — Z1231 Encounter for screening mammogram for malignant neoplasm of breast: Secondary | ICD-10-CM

## 2024-02-06 ENCOUNTER — Ambulatory Visit (HOSPITAL_COMMUNITY)
Admission: RE | Admit: 2024-02-06 | Discharge: 2024-02-06 | Disposition: A | Discharge status: Home / Self Care | Source: Ambulatory Visit | Attending: Family Medicine | Admitting: Family Medicine

## 2024-02-06 ENCOUNTER — Encounter (HOSPITAL_COMMUNITY): Payer: Self-pay

## 2024-02-06 DIAGNOSIS — Z1231 Encounter for screening mammogram for malignant neoplasm of breast: Secondary | ICD-10-CM | POA: Insufficient documentation

## 2024-02-13 ENCOUNTER — Ambulatory Visit: Payer: Self-pay | Admitting: Family Medicine

## 2024-02-22 ENCOUNTER — Other Ambulatory Visit: Payer: Self-pay | Admitting: Family Medicine

## 2024-02-22 DIAGNOSIS — I10 Essential (primary) hypertension: Secondary | ICD-10-CM

## 2024-02-22 DIAGNOSIS — E1169 Type 2 diabetes mellitus with other specified complication: Secondary | ICD-10-CM

## 2024-03-06 ENCOUNTER — Other Ambulatory Visit: Payer: Self-pay | Admitting: Family Medicine

## 2024-03-06 DIAGNOSIS — I10 Essential (primary) hypertension: Secondary | ICD-10-CM

## 2024-03-12 ENCOUNTER — Ambulatory Visit: Admitting: Family Medicine

## 2024-03-12 ENCOUNTER — Encounter: Payer: Self-pay | Admitting: Family Medicine

## 2024-03-12 VITALS — BP 132/74 | HR 55 | Temp 97.7°F | Ht 65.0 in | Wt 318.2 lb

## 2024-03-12 DIAGNOSIS — G4733 Obstructive sleep apnea (adult) (pediatric): Secondary | ICD-10-CM | POA: Diagnosis not present

## 2024-03-12 DIAGNOSIS — E119 Type 2 diabetes mellitus without complications: Secondary | ICD-10-CM

## 2024-03-12 DIAGNOSIS — I10 Essential (primary) hypertension: Secondary | ICD-10-CM

## 2024-03-12 DIAGNOSIS — Z7984 Long term (current) use of oral hypoglycemic drugs: Secondary | ICD-10-CM

## 2024-03-12 LAB — POCT GLYCOSYLATED HEMOGLOBIN (HGB A1C): Hemoglobin A1C: 5.6 % (ref 4.0–5.6)

## 2024-03-12 MED ORDER — TIRZEPATIDE 10 MG/0.5ML ~~LOC~~ SOAJ: 10.0000 mg | SUBCUTANEOUS | 2 refills | Status: AC

## 2024-03-12 MED ORDER — LOSARTAN POTASSIUM 100 MG PO TABS: 100.0000 mg | ORAL_TABLET | Freq: Every day | ORAL | 1 refills | Status: AC

## 2024-03-29 ENCOUNTER — Ambulatory Visit: Payer: 59 | Admitting: Obstetrics and Gynecology

## 2024-03-30 ENCOUNTER — Ambulatory Visit: Admitting: Obstetrics and Gynecology

## 2024-04-05 ENCOUNTER — Ambulatory Visit: Admitting: Obstetrics and Gynecology

## 2024-04-27 ENCOUNTER — Ambulatory Visit: Admitting: Obstetrics and Gynecology

## 2024-06-10 ENCOUNTER — Ambulatory Visit: Admitting: Family Medicine
